# Patient Record
Sex: Female | Born: 1995 | Race: Black or African American | Hispanic: No | Marital: Single | Smoking: Never smoker
Health system: Southern US, Community
[De-identification: ages and names within clinical notes are randomized; demographics above are authoritative.]

## PROBLEM LIST (undated history)

## (undated) ENCOUNTER — Inpatient Hospital Stay (HOSPITAL_COMMUNITY): Payer: Self-pay

## (undated) DIAGNOSIS — A549 Gonococcal infection, unspecified: Secondary | ICD-10-CM

## (undated) DIAGNOSIS — D649 Anemia, unspecified: Secondary | ICD-10-CM

## (undated) DIAGNOSIS — Z8669 Personal history of other diseases of the nervous system and sense organs: Secondary | ICD-10-CM

## (undated) DIAGNOSIS — A749 Chlamydial infection, unspecified: Secondary | ICD-10-CM

## (undated) DIAGNOSIS — B977 Papillomavirus as the cause of diseases classified elsewhere: Secondary | ICD-10-CM

## (undated) HISTORY — PX: WISDOM TOOTH EXTRACTION: SHX21

## (undated) HISTORY — DX: Anemia, unspecified: D64.9

## (undated) HISTORY — DX: Papillomavirus as the cause of diseases classified elsewhere: B97.7

---
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
## 2016-05-02 ENCOUNTER — Inpatient Hospital Stay (HOSPITAL_COMMUNITY)
Admission: AD | Admit: 2016-05-02 | Discharge: 2016-05-02 | Disposition: A | Discharge status: Home / Self Care | Payer: Medicaid Other | Source: Ambulatory Visit | Attending: Obstetrics & Gynecology | Admitting: Obstetrics & Gynecology

## 2016-05-02 ENCOUNTER — Encounter (HOSPITAL_COMMUNITY): Payer: Self-pay

## 2016-05-02 DIAGNOSIS — R102 Pelvic and perineal pain: Secondary | ICD-10-CM | POA: Diagnosis not present

## 2016-05-02 DIAGNOSIS — N739 Female pelvic inflammatory disease, unspecified: Secondary | ICD-10-CM | POA: Diagnosis not present

## 2016-05-02 DIAGNOSIS — N939 Abnormal uterine and vaginal bleeding, unspecified: Secondary | ICD-10-CM | POA: Diagnosis present

## 2016-05-02 DIAGNOSIS — Z3202 Encounter for pregnancy test, result negative: Secondary | ICD-10-CM | POA: Insufficient documentation

## 2016-05-02 HISTORY — DX: Gonococcal infection, unspecified: A54.9

## 2016-05-02 LAB — URINALYSIS, ROUTINE W REFLEX MICROSCOPIC
Bilirubin Urine: NEGATIVE
Glucose, UA: NEGATIVE mg/dL
Hgb urine dipstick: NEGATIVE
Ketones, ur: NEGATIVE mg/dL
NITRITE: NEGATIVE
PROTEIN: NEGATIVE mg/dL
Specific Gravity, Urine: 1.015 (ref 1.005–1.030)
pH: 6.5 (ref 5.0–8.0)

## 2016-05-02 LAB — CBC
HCT: 29.8 % — ABNORMAL LOW (ref 36.0–46.0)
Hemoglobin: 9.4 g/dL — ABNORMAL LOW (ref 12.0–15.0)
MCH: 20.3 pg — ABNORMAL LOW (ref 26.0–34.0)
MCHC: 31.5 g/dL (ref 30.0–36.0)
MCV: 64.4 fL — ABNORMAL LOW (ref 78.0–100.0)
Platelets: 304 10*3/uL (ref 150–400)
RBC: 4.63 MIL/uL (ref 3.87–5.11)
RDW: 19.1 % — ABNORMAL HIGH (ref 11.5–15.5)
WBC: 19.3 10*3/uL — ABNORMAL HIGH (ref 4.0–10.5)

## 2016-05-02 LAB — WET PREP, GENITAL
CLUE CELLS WET PREP: NONE SEEN
Sperm: NONE SEEN
Trich, Wet Prep: NONE SEEN
YEAST WET PREP: NONE SEEN

## 2016-05-02 LAB — URINE MICROSCOPIC-ADD ON

## 2016-05-02 LAB — POCT PREGNANCY, URINE: Preg Test, Ur: NEGATIVE

## 2016-05-02 MED ORDER — IBUPROFEN 800 MG PO TABS: 800.0000 mg | ORAL_TABLET | Freq: Three times a day (TID) | ORAL | 2 refills | Status: DC | PRN

## 2016-05-02 MED ORDER — AZITHROMYCIN 250 MG PO TABS
1000.0000 mg | ORAL_TABLET | ORAL | Status: AC
  Administered 2016-05-02: 1000 mg via ORAL
  Filled 2016-05-02: qty 4

## 2016-05-02 MED ORDER — ACETAMINOPHEN 500 MG PO TABS
1000.0000 mg | ORAL_TABLET | Freq: Once | ORAL | Status: AC
  Administered 2016-05-02: 1000 mg via ORAL
  Filled 2016-05-02: qty 2

## 2016-05-02 MED ORDER — AZITHROMYCIN 500 MG PO TABS: 1000.0000 mg | ORAL_TABLET | Freq: Once | ORAL | 1 refills | Status: AC

## 2016-05-02 MED ORDER — KETOROLAC TROMETHAMINE 60 MG/2ML IM SOLN
60.0000 mg | Freq: Once | INTRAMUSCULAR | Status: AC
  Administered 2016-05-02: 60 mg via INTRAMUSCULAR
  Filled 2016-05-02: qty 2

## 2016-05-02 MED ORDER — CEFTRIAXONE SODIUM 250 MG IJ SOLR
250.0000 mg | INTRAMUSCULAR | Status: AC
  Administered 2016-05-02: 250 mg via INTRAMUSCULAR
  Filled 2016-05-02: qty 250

## 2016-05-02 NOTE — MAU Note (Signed)
Was supposed to come on the 18th.  Started bleeding on the 14th, bleeding heavy- having to change every hour, passing large clots.  Slowed down, now is orangish colored and is hurting worse than cramps.

## 2016-05-02 NOTE — MAU Provider Note (Signed)
History     CSN: [id number]  Arrival date and time: 05/02/16 1716   First Provider Initiated Contact with Patient 05/02/16 1905      Chief Complaint  Patient presents with  . Vaginal Bleeding  . Abdominal Pain   HPI  Alexandra Lowery is a 20 y.o. G1P1001 who presents to MAU today for evaluation of vaginal bleeding and pain. She reports that her period was supposed to come on the 18th.  Started bleeding on the 14th, bleeding was heavy, changed pad every hour, and was passing large clots.Accompanied by moderate cramping not alleviated by NSAIDs. Bleeding has since stopped, has orange colored discharge now but still having pelvic pain that is constant and not responding to taking two tablets of Ibuprofen.  Denies any fevers, chills, sweats, dysuria, nausea, vomiting, other GI or GU symptoms or other general symptoms. Patient has history of GC in 2015 .   Obstetric History   G1   P1   T1   P0   A0   L1    SAB0   TAB0   Ectopic0   Multiple0   Live Births1     # Outcome Date GA Lbr Len/2nd Weight Sex Delivery Anes PTL Lv  1 Term 12/15/12 [id number] 08:27 / 00:26 6 lb 10.7 oz (3.025 kg) F Vag-Spont EPI  LIV     Name: Anglemyer,GIRL Audreana     Apgar1:  9                Apgar5: 9      Past Medical History:  Diagnosis Date  . Gonorrhea 2015  . Hx of migraines     Past Surgical History:  Procedure Laterality Date  . WISDOM TOOTH EXTRACTION      History reviewed. No pertinent family history.  Social History  Substance Use Topics  . Smoking status: Never Smoker  . Smokeless tobacco: Never Used  . Alcohol use No    Allergies: No Known Allergies  Prescriptions Prior to Admission  Medication Sig Dispense Refill Last Dose  . [DISCONTINUED] ibuprofen (ADVIL,MOTRIN) 200 MG tablet Take 600 mg by mouth every 6 (six) hours as needed for mild pain.   05/01/2016 at Unknown time    Review of Systems  Constitutional: Negative for chills, fever and malaise/fatigue.  Gastrointestinal:  Positive for abdominal pain. Negative for blood in stool, constipation and diarrhea.  Genitourinary: Negative for dysuria, frequency, hematuria and urgency.  Musculoskeletal: Negative for myalgias.  Neurological: Negative for headaches.  All other systems reviewed and are negative.  Physical Exam   Blood pressure 101/62, pulse 92, temperature 98.8 F (37.1 C), temperature source Oral, resp. rate 16, weight 165 lb 8 oz (75.1 kg), last menstrual period 04/03/2016.  Physical Exam  Constitutional: She is oriented to person, place, and time. She appears well-developed and well-nourished.  HENT:  Head: Normocephalic and atraumatic.  Eyes: EOM are normal. Pupils are equal, round, and reactive to light.  Neck: Normal range of motion. Neck supple.  Cardiovascular: Normal rate.   Respiratory: Effort normal and breath sounds normal.  GI: Soft. She exhibits no distension and no mass. There is tenderness. There is no rebound and no guarding.  Genitourinary: Vaginal discharge found.  Genitourinary Comments: Yellow discharge noted in vagina.  Pelvic cultures obtained.  No CMT, but had moderate uterine tenderness to palpation. Normal adnexa, mild adnexal tenderness.   Musculoskeletal: Normal range of motion.  Neurological: She is alert and oriented to person, place, and time.  Skin: Skin  is warm and dry.    MAU Course  Procedures  MDM 1920 Toradol 60 mg IM, Rocephin 250mg  IM, Azithromycin 1000 mg PO administered due to concern about possible PID.   2000 Tylenol 1000 mg po also given for continued pain. 2030 Pain resolved  Results for orders placed or performed during the hospital encounter of 05/02/16 (from the past 24 hour(s))  Urinalysis, Routine w reflex microscopic (not at [facility])     Status: Abnormal   Collection Time: 05/02/16  6:33 PM  Result Value Ref Range   Color, Urine YELLOW YELLOW   APPearance CLEAR CLEAR   Specific Gravity, Urine 1.015 1.005 - 1.030   pH 6.5 5.0 - 8.0    Glucose, UA NEGATIVE NEGATIVE mg/dL   Hgb urine dipstick NEGATIVE NEGATIVE   Bilirubin Urine NEGATIVE NEGATIVE   Ketones, ur NEGATIVE NEGATIVE mg/dL   Protein, ur NEGATIVE NEGATIVE mg/dL   Nitrite NEGATIVE NEGATIVE   Leukocytes, UA SMALL (A) NEGATIVE  Urine microscopic-add on     Status: Abnormal   Collection Time: 05/02/16  6:33 PM  Result Value Ref Range   Squamous Epithelial / LPF 0-5 (A) NONE SEEN   WBC, UA 0-5 0 - 5 WBC/hpf   RBC / HPF 0-5 0 - 5 RBC/hpf   Bacteria, UA RARE (A) NONE SEEN   Urine-Other MUCOUS PRESENT   CBC     Status: Abnormal   Collection Time: 05/02/16  6:35 PM  Result Value Ref Range   WBC 19.3 (H) 4.0 - 10.5 K/uL   RBC 4.63 3.87 - 5.11 MIL/uL   Hemoglobin 9.4 (L) 12.0 - 15.0 g/dL   HCT 82.9 (L) 56.2 - 13.0 %   MCV 64.4 (L) 78.0 - 100.0 fL   MCH 20.3 (L) 26.0 - 34.0 pg   MCHC 31.5 30.0 - 36.0 g/dL   RDW 86.5 (H) 78.4 - 69.6 %   Platelets 304 150 - 400 K/uL  Pregnancy, urine POC     Status: None   Collection Time: 05/02/16  6:49 PM  Result Value Ref Range   Preg Test, Ur NEGATIVE NEGATIVE  Wet prep, genital     Status: Abnormal   Collection Time: 05/02/16  7:10 PM  Result Value Ref Range   Yeast Wet Prep HPF POC NONE SEEN NONE SEEN   Trich, Wet Prep NONE SEEN NONE SEEN   Clue Cells Wet Prep HPF POC NONE SEEN NONE SEEN   WBC, Wet Prep HPF POC FEW (A) NONE SEEN   Sperm NONE SEEN       Assessment and Plan   1. Pelvic inflammatory disease (PID)   2. Abnormal vaginal bleeding   3. Pelvic pain in female    Presumptively treated for PID, advised to take second dose of Azithromycin in seven days Ibuprofen 800 mg po tid prescribed Reassured about earlier menstrual period, bleeding precautions reviewed. Will follow up all pending results and manage accordingly. Discharged to home in stable condition.   Tereso Newcomer, MD 05/02/2016, 8:37 PM

## 2016-05-02 NOTE — Discharge Instructions (Signed)

## 2016-05-03 ENCOUNTER — Encounter: Payer: Self-pay | Admitting: Obstetrics & Gynecology

## 2016-05-03 LAB — GC/CHLAMYDIA PROBE AMP (~~LOC~~) NOT AT ARMC
Chlamydia: NEGATIVE
NEISSERIA GONORRHEA: POSITIVE — AB

## 2016-05-03 NOTE — Progress Notes (Signed)
Patient has gonorrhea, was treated in MAU.  Needs to take second dose of Azithromycin in one week as prescribed to complete PID treatment.   Recommend testing for other STIs, also needs to let partner(s) know so the partner(s) can get testing and treatment. Patient and sex partner(s) should abstain from unprotected sexual activity for seven days after everyone receives appropriate treatment.  Please call to inform patient of results and recommendations, and advise to take medications as prescribed during her MAU visit.  Tereso NewcomerUgonna A Mal Asher, MD

## 2016-05-04 ENCOUNTER — Telehealth (HOSPITAL_COMMUNITY): Payer: Self-pay | Admitting: *Deleted

## 2016-05-04 DIAGNOSIS — A549 Gonococcal infection, unspecified: Secondary | ICD-10-CM

## 2016-05-04 MED ORDER — AZITHROMYCIN 500 MG PO TABS: ORAL_TABLET | ORAL | 0 refills | Status: DC

## 2016-05-04 NOTE — Telephone Encounter (Signed)
Telephone call to patient regarding positive GC culture, patient notified.  Patient treated at time of visit but needs repeat dose of Azithromycin in one week per Dr. Macon LargeAnyanwu to cover her PID.  Instructed patient to repeat Rx on 05/10/16.  Rx sent to pharmacy.  Instructed patient to notify her partner for treatment and to abstain from sex for seven days post treatment.  Report faxed to health department.

## 2017-01-18 ENCOUNTER — Inpatient Hospital Stay (HOSPITAL_COMMUNITY): Payer: Medicaid Other

## 2017-01-18 ENCOUNTER — Inpatient Hospital Stay (HOSPITAL_COMMUNITY)
Admission: AD | Admit: 2017-01-18 | Discharge: 2017-01-18 | Disposition: A | Discharge status: Home / Self Care | Payer: Medicaid Other | Source: Ambulatory Visit | Attending: Obstetrics and Gynecology | Admitting: Obstetrics and Gynecology

## 2017-01-18 ENCOUNTER — Encounter (HOSPITAL_COMMUNITY): Payer: Self-pay | Admitting: *Deleted

## 2017-01-18 DIAGNOSIS — O26891 Other specified pregnancy related conditions, first trimester: Secondary | ICD-10-CM | POA: Insufficient documentation

## 2017-01-18 DIAGNOSIS — O99011 Anemia complicating pregnancy, first trimester: Secondary | ICD-10-CM | POA: Insufficient documentation

## 2017-01-18 DIAGNOSIS — O98811 Other maternal infectious and parasitic diseases complicating pregnancy, first trimester: Secondary | ICD-10-CM | POA: Diagnosis not present

## 2017-01-18 DIAGNOSIS — O3680X0 Pregnancy with inconclusive fetal viability, not applicable or unspecified: Secondary | ICD-10-CM

## 2017-01-18 DIAGNOSIS — O98211 Gonorrhea complicating pregnancy, first trimester: Secondary | ICD-10-CM | POA: Insufficient documentation

## 2017-01-18 DIAGNOSIS — B3731 Acute candidiasis of vulva and vagina: Secondary | ICD-10-CM

## 2017-01-18 DIAGNOSIS — O9989 Other specified diseases and conditions complicating pregnancy, childbirth and the puerperium: Secondary | ICD-10-CM | POA: Diagnosis not present

## 2017-01-18 DIAGNOSIS — R1030 Lower abdominal pain, unspecified: Secondary | ICD-10-CM | POA: Insufficient documentation

## 2017-01-18 DIAGNOSIS — N8312 Corpus luteum cyst of left ovary: Secondary | ICD-10-CM | POA: Insufficient documentation

## 2017-01-18 DIAGNOSIS — B373 Candidiasis of vulva and vagina: Secondary | ICD-10-CM

## 2017-01-18 DIAGNOSIS — K5900 Constipation, unspecified: Secondary | ICD-10-CM | POA: Diagnosis not present

## 2017-01-18 DIAGNOSIS — A749 Chlamydial infection, unspecified: Secondary | ICD-10-CM

## 2017-01-18 DIAGNOSIS — O26899 Other specified pregnancy related conditions, unspecified trimester: Secondary | ICD-10-CM

## 2017-01-18 DIAGNOSIS — O3481 Maternal care for other abnormalities of pelvic organs, first trimester: Secondary | ICD-10-CM | POA: Diagnosis not present

## 2017-01-18 DIAGNOSIS — Z3A00 Weeks of gestation of pregnancy not specified: Secondary | ICD-10-CM | POA: Diagnosis not present

## 2017-01-18 DIAGNOSIS — R109 Unspecified abdominal pain: Secondary | ICD-10-CM

## 2017-01-18 DIAGNOSIS — O99611 Diseases of the digestive system complicating pregnancy, first trimester: Secondary | ICD-10-CM

## 2017-01-18 HISTORY — DX: Chlamydial infection, unspecified: A74.9

## 2017-01-18 LAB — WET PREP, GENITAL
SPERM: NONE SEEN
TRICH WET PREP: NONE SEEN

## 2017-01-18 LAB — CBC
HCT: 26 % — ABNORMAL LOW (ref 36.0–46.0)
HEMOGLOBIN: 7.6 g/dL — AB (ref 12.0–15.0)
MCH: 16.9 pg — ABNORMAL LOW (ref 26.0–34.0)
MCHC: 29.2 g/dL — ABNORMAL LOW (ref 30.0–36.0)
MCV: 57.6 fL — ABNORMAL LOW (ref 78.0–100.0)
Platelets: 330 10*3/uL (ref 150–400)
RBC: 4.51 MIL/uL (ref 3.87–5.11)
RDW: 20.4 % — ABNORMAL HIGH (ref 11.5–15.5)
WBC: 7.5 10*3/uL (ref 4.0–10.5)

## 2017-01-18 LAB — URINALYSIS, ROUTINE W REFLEX MICROSCOPIC
Bilirubin Urine: NEGATIVE
Glucose, UA: NEGATIVE mg/dL
Hgb urine dipstick: NEGATIVE
Ketones, ur: 5 mg/dL — AB
Nitrite: NEGATIVE
PROTEIN: 30 mg/dL — AB
Specific Gravity, Urine: 1.024 (ref 1.005–1.030)
pH: 5 (ref 5.0–8.0)

## 2017-01-18 LAB — HCG, QUANTITATIVE, PREGNANCY: hCG, Beta Chain, Quant, S: 1843 m[IU]/mL — ABNORMAL HIGH (ref <5)

## 2017-01-18 LAB — POCT PREGNANCY, URINE: PREG TEST UR: POSITIVE — AB

## 2017-01-18 MED ORDER — DOCUSATE SODIUM 100 MG PO CAPS: 100.0000 mg | ORAL_CAPSULE | Freq: Two times a day (BID) | ORAL | 0 refills | Status: DC

## 2017-01-18 MED ORDER — FERROUS SULFATE 325 (65 FE) MG PO TABS: 325.0000 mg | ORAL_TABLET | Freq: Every day | ORAL | 0 refills | Status: DC

## 2017-01-18 MED ORDER — TERCONAZOLE 0.8 % VA CREA: 1.0000 | TOPICAL_CREAM | Freq: Every day | VAGINAL | 0 refills | Status: DC

## 2017-01-18 NOTE — Discharge Instructions (Signed)
Return to care   If you have heavier bleeding that soaks through more that 2 pads per hour for an hour or more  If you bleed so much that you feel like you might pass out or you do pass out  If you have significant abdominal pain that is not improved with Tylenol   If you develop a fever > 100.5     Constipation, Adult Constipation is when a person has fewer bowel movements in a week than normal, has difficulty having a bowel movement, or has stools that are dry, hard, or larger than normal. Constipation may be caused by an underlying condition. It may become worse with age if a person takes certain medicines and does not take in enough fluids. Follow these instructions at home: Eating and drinking   Eat foods that have a lot of fiber, such as fresh fruits and vegetables, whole grains, and beans.  Limit foods that are high in fat, low in fiber, or overly processed, such as french fries, hamburgers, cookies, candies, and soda.  Drink enough fluid to keep your urine clear or pale yellow. General instructions  Exercise regularly or as told by your health care provider.  Go to the restroom when you have the urge to go. Do not hold it in.  Take over-the-counter and prescription medicines only as told by your health care provider. These include any fiber supplements.  Practice pelvic floor retraining exercises, such as deep breathing while relaxing the lower abdomen and pelvic floor relaxation during bowel movements.  Watch your condition for any changes.  Keep all follow-up visits as told by your health care provider. This is important. Contact a health care provider if:  You have pain that gets worse.  You have a fever.  You do not have a bowel movement after 4 days.  You vomit.  You are not hungry.  You lose weight.  You are bleeding from the anus.  You have thin, pencil-like stools. Get help right away if:  You have a fever and your symptoms suddenly get  worse.  You leak stool or have blood in your stool.  Your abdomen is bloated.  You have severe pain in your abdomen.  You feel dizzy or you faint. This information is not intended to replace advice given to you by your health care provider. Make sure you discuss any questions you have with your health care provider. Document Released: 04/27/2004 Document Revised: 02/17/2016 Document Reviewed: 01/18/2016 Elsevier Interactive Patient Education  2017 [employer]    Anemia, Nonspecific Anemia is a condition in which the concentration of red blood cells or hemoglobin in the blood is below normal. Hemoglobin is a substance in red blood cells that carries oxygen to the tissues of the body. Anemia results in not enough oxygen reaching these tissues. What are the causes? Common causes of anemia include:  Excessive bleeding. Bleeding may be internal or external. This includes excessive bleeding from periods (in women) or from the intestine.  Poor nutrition.  Chronic kidney, thyroid, and liver disease.  Bone marrow disorders that decrease red blood cell production.  Cancer and treatments for cancer.  HIV, AIDS, and their treatments.  Spleen problems that increase red blood cell destruction.  Blood disorders.  Excess destruction of red blood cells due to infection, medicines, and autoimmune disorders.  What are the signs or symptoms?  Minor weakness.  Dizziness.  Headache.  Palpitations.  Shortness of breath, especially with exercise.  Paleness.  Cold sensitivity.  Indigestion.  Nausea.  Difficulty sleeping.  Difficulty concentrating. Symptoms may occur suddenly or they may develop slowly. How is this diagnosed? Additional blood tests are often needed. These help your health care provider determine the best treatment. Your health care provider will check your stool for blood and look for other causes of blood loss. How is this treated? Treatment varies  depending on the cause of the anemia. Treatment can include:  Supplements of iron, vitamin B12, or folic acid.  Hormone medicines.  A blood transfusion. This may be needed if blood loss is severe.  Hospitalization. This may be needed if there is significant continual blood loss.  Dietary changes.  Spleen removal.  Follow these instructions at home: Keep all follow-up appointments. It often takes many weeks to correct anemia, and having your health care provider check on your condition and your response to treatment is very important. Get help right away if:  You develop extreme weakness, shortness of breath, or chest pain.  You become dizzy or have trouble concentrating.  You develop heavy vaginal bleeding.  You develop a rash.  You have bloody or black, tarry stools.  You faint.  You vomit up blood.  You vomit repeatedly.  You have abdominal pain.  You have a fever or persistent symptoms for more than 2-3 days.  You have a fever and your symptoms suddenly get worse.  You are dehydrated. This information is not intended to replace advice given to you by your health care provider. Make sure you discuss any questions you have with your health care provider. Document Released: 09/06/2004 Document Revised: 01/11/2016 Document Reviewed: 01/23/2013 Elsevier Interactive Patient Education  2017 [employer].

## 2017-01-18 NOTE — MAU Note (Signed)
+  HPT yesterday LMP 12/20/2016  +lower abdominal and back pain Sharp in nature Tried tylenol last took 2 days ago with no relief Rating pain 5/10 Pain comes and goes Awakes from sleep  +vaginal discharge Thick and white  Denies vaginal bleeding

## 2017-01-18 NOTE — MAU Provider Note (Signed)
History     CSN: [id number]  Arrival date and time: 01/18/17 1746  First Provider Initiated Contact with Patient 01/18/17 1916      Chief Complaint  Patient presents with  . Abdominal Pain  . Vaginal Discharge   HPI Alexandra Lowery is a 21 y.o. G2P1001 at 2136w1d by LMP who presents with abdominal pain & vaginal discharge. Symptoms began last week. Reports worsening lower abdominal cramping that comes & goes. Rates pain 5/10. Has taken tylenol without relief. Also reports thick white vaginal discharge. Use OTC yeast medication last week. Denies n/v/d, vaginal bleeding, dysuria, or hematuria. Doesn't remember when last BM was; states she's been having issues with constipation. Hasn't treated constipation.   OB History    Gravida Para Term Preterm AB Living   2 1 1     1    SAB TAB Ectopic Multiple Live Births           1      Past Medical History:  Diagnosis Date  . Gonorrhea 2015 and 2017  . Hx of migraines     Past Surgical History:  Procedure Laterality Date  . WISDOM TOOTH EXTRACTION      No family history on file.  Social History  Substance Use Topics  . Smoking status: Never Smoker  . Smokeless tobacco: Never Used  . Alcohol use No    Allergies: No Known Allergies  Prescriptions Prior to Admission  Medication Sig Dispense Refill Last Dose  . azithromycin (ZITHROMAX) 500 MG tablet Take two tablets by mouth once 2 tablet 0   . ibuprofen (ADVIL,MOTRIN) 800 MG tablet Take 1 tablet (800 mg total) by mouth 3 (three) times daily with meals as needed for mild pain. 30 tablet 2     Review of Systems  Constitutional: Negative.   Gastrointestinal: Positive for abdominal pain and constipation. Negative for blood in stool, diarrhea, nausea and vomiting.  Genitourinary: Positive for vaginal discharge. Negative for dysuria and vaginal bleeding.   Physical Exam   Blood pressure 116/65, pulse 75, temperature 98.5 F (36.9 C), temperature source Oral, resp. rate 16,  weight 153 lb 1.9 oz (69.5 kg), last menstrual period 12/20/2016, SpO2 99 %.  Physical Exam  Nursing note and vitals reviewed. Constitutional: She is oriented to person, place, and time. She appears well-developed and well-nourished. No distress.  HENT:  Head: Normocephalic and atraumatic.  Eyes: Conjunctivae are normal. Right eye exhibits no discharge. Left eye exhibits no discharge. No scleral icterus.  Neck: Normal range of motion.  Cardiovascular: Normal rate, regular rhythm and normal heart sounds.   No murmur heard. Respiratory: Effort normal and breath sounds normal. No respiratory distress. She has no wheezes.  GI: Soft. Bowel sounds are normal. There is no tenderness.  Genitourinary: Uterus normal. Cervix exhibits no motion tenderness. Right adnexum displays no mass and no tenderness. Left adnexum displays no mass and no tenderness.  Genitourinary Comments: Cervix closed  Neurological: She is alert and oriented to person, place, and time.  Skin: Skin is warm and dry. She is not diaphoretic.  Psychiatric: She has a normal mood and affect. Her behavior is normal. Judgment and thought content normal.    MAU Course  Procedures Results for orders placed or performed during the hospital encounter of 01/18/17 (from the past 24 hour(s))  Urinalysis, Routine w reflex microscopic     Status: Abnormal   Collection Time: 01/18/17  5:55 PM  Result Value Ref Range   Color, Urine YELLOW YELLOW  APPearance CLOUDY (A) CLEAR   Specific Gravity, Urine 1.024 1.005 - 1.030   pH 5.0 5.0 - 8.0   Glucose, UA NEGATIVE NEGATIVE mg/dL   Hgb urine dipstick NEGATIVE NEGATIVE   Bilirubin Urine NEGATIVE NEGATIVE   Ketones, ur 5 (A) NEGATIVE mg/dL   Protein, ur 30 (A) NEGATIVE mg/dL   Nitrite NEGATIVE NEGATIVE   Leukocytes, UA LARGE (A) NEGATIVE   RBC / HPF 6-30 0 - 5 RBC/hpf   WBC, UA TOO NUMEROUS TO COUNT 0 - 5 WBC/hpf   Bacteria, UA RARE (A) NONE SEEN   Squamous Epithelial / LPF 6-30 (A) NONE  SEEN   Mucous PRESENT   Pregnancy, urine POC     Status: Abnormal   Collection Time: 01/18/17  6:11 PM  Result Value Ref Range   Preg Test, Ur POSITIVE (A) NEGATIVE  CBC     Status: Abnormal   Collection Time: 01/18/17  7:02 PM  Result Value Ref Range   WBC 7.5 4.0 - 10.5 K/uL   RBC 4.51 3.87 - 5.11 MIL/uL   Hemoglobin 7.6 (L) 12.0 - 15.0 g/dL   HCT 47.8 (L) 29.5 - 62.1 %   MCV 57.6 (L) 78.0 - 100.0 fL   MCH 16.9 (L) 26.0 - 34.0 pg   MCHC 29.2 (L) 30.0 - 36.0 g/dL   RDW 30.8 (H) 65.7 - 84.6 %   Platelets 330 150 - 400 K/uL  hCG, quantitative, pregnancy     Status: Abnormal   Collection Time: 01/18/17  7:02 PM  Result Value Ref Range   hCG, Beta Chain, Quant, S 1,843 (H) <5 mIU/mL  Wet prep, genital     Status: Abnormal   Collection Time: 01/18/17  7:18 PM  Result Value Ref Range   Yeast Wet Prep HPF POC PRESENT (A) NONE SEEN   Trich, Wet Prep NONE SEEN NONE SEEN   Clue Cells Wet Prep HPF POC PRESENT (A) NONE SEEN   WBC, Wet Prep HPF POC MODERATE (A) NONE SEEN   Sperm NONE SEEN    US Ob Comp Less 14 Wks  Result Date: 01/18/2017 CLINICAL DATA:  First-trimester pregnancy. Sharp lower abdominal pain since yesterday. Quantitative beta HCG is 1843 EXAM: OBSTETRIC <14 WK [place] AND TRANSVAGINAL OB US TECHNIQUE: Both transabdominal and transvaginal ultrasound examinations were performed for complete evaluation of the gestation as well as the maternal uterus, adnexal regions, and pelvic cul-de-sac. Transvaginal technique was performed to assess early pregnancy. COMPARISON:  None. FINDINGS: Intrauterine gestational sac: Not visualized. Subchorionic hemorrhage:  None visualized. Maternal uterus/adnexae: The endometrium is somewhat thickened and heterogeneous. No discrete gestational sac is visualized. The ovaries are within normal limits bilaterally measuring 2.0 x 1.8 x 1.6 cm on the right and 3.0 x 3.7 x 2.9 cm on the left. A corpus luteal cyst is evident within the left ovary. A moderate amount  of free fluid is present. IMPRESSION: 1. No intrauterine gestational sac identified. Probable early intrauterine gestational sac, but no yolk sac, fetal pole, or cardiac activity yet visualized. Recommend follow-up quantitative B-HCG levels and follow-up US in 14 days to assess viability. This recommendation follows SRU consensus guidelines: Diagnostic Criteria for Nonviable Pregnancy Early in the First Trimester. Malva Limes Med 2013; 962:9528-41. 2. Left sided corpus luteal cyst. 3. Moderate free fluid. Electronically Signed   By: Marin Roberts M.D.   On: 01/18/2017 20:44   US Ob Transvaginal  Result Date: 01/18/2017 CLINICAL DATA:  First-trimester pregnancy. Sharp lower abdominal pain since yesterday. Quantitative  beta HCG is 1843 EXAM: OBSTETRIC <14 WK [place] AND TRANSVAGINAL OB US TECHNIQUE: Both transabdominal and transvaginal ultrasound examinations were performed for complete evaluation of the gestation as well as the maternal uterus, adnexal regions, and pelvic cul-de-sac. Transvaginal technique was performed to assess early pregnancy. COMPARISON:  None. FINDINGS: Intrauterine gestational sac: Not visualized. Subchorionic hemorrhage:  None visualized. Maternal uterus/adnexae: The endometrium is somewhat thickened and heterogeneous. No discrete gestational sac is visualized. The ovaries are within normal limits bilaterally measuring 2.0 x 1.8 x 1.6 cm on the right and 3.0 x 3.7 x 2.9 cm on the left. A corpus luteal cyst is evident within the left ovary. A moderate amount of free fluid is present. IMPRESSION: 1. No intrauterine gestational sac identified. Probable early intrauterine gestational sac, but no yolk sac, fetal pole, or cardiac activity yet visualized. Recommend follow-up quantitative B-HCG levels and follow-up US in 14 days to assess viability. This recommendation follows SRU consensus guidelines: Diagnostic Criteria for Nonviable Pregnancy Early in the First Trimester. Malva Limes Med 2013;  161:0960-45. 2. Left sided corpus luteal cyst. 3. Moderate free fluid. Electronically Signed   By: Marin Roberts M.D.   On: 01/18/2017 20:44    MDM +UPT UA, wet prep, GC/chlamydia, CBC, ABO/Rh, quant hCG, HIV, and [place] today to rule out ectopic pregnancy Wet prep + yeast BHCG 1843 Ultrasound shows no IUP, thickened endometrium, left CLC, moderate free fluid Hemoglobin 7.6; hx of anemia Discussed labs & ultrasound with Dr. Emelda Fear whom reviewed the images. Will have patient return for repeat BHCG on Monday Assessment and Plan  A: 1. Pregnancy of unknown anatomic location   2. Abdominal pain affecting pregnancy   3. Anemia during pregnancy in first trimester   4. Constipation during pregnancy in first trimester   5. Vaginal yeast infection    P: Discharge home Rx terazol, ferrous sulfate, colace Return to MAU Monday for repeat BHCG (states can't return during clinic hours) Discussed reasons to return to MAU including s/s ectopic Discussed tx of constipation GC/CT pending  Judeth Horn 01/18/2017, 7:16 PM

## 2017-01-19 LAB — HIV ANTIBODY (ROUTINE TESTING W REFLEX): HIV SCREEN 4TH GENERATION: NONREACTIVE

## 2017-01-21 LAB — GC/CHLAMYDIA PROBE AMP (~~LOC~~) NOT AT ARMC
Chlamydia: POSITIVE — AB
Neisseria Gonorrhea: NEGATIVE

## 2017-01-22 ENCOUNTER — Ambulatory Visit: Payer: Medicaid Other | Admitting: General Practice

## 2017-01-22 ENCOUNTER — Other Ambulatory Visit: Payer: Self-pay | Admitting: Nurse Practitioner

## 2017-01-22 ENCOUNTER — Telehealth: Payer: Self-pay | Admitting: General Practice

## 2017-01-22 ENCOUNTER — Telehealth (HOSPITAL_COMMUNITY): Payer: Self-pay | Admitting: *Deleted

## 2017-01-22 DIAGNOSIS — O3680X0 Pregnancy with inconclusive fetal viability, not applicable or unspecified: Secondary | ICD-10-CM

## 2017-01-22 DIAGNOSIS — A749 Chlamydial infection, unspecified: Secondary | ICD-10-CM | POA: Insufficient documentation

## 2017-01-22 DIAGNOSIS — O98811 Other maternal infectious and parasitic diseases complicating pregnancy, first trimester: Secondary | ICD-10-CM

## 2017-01-22 LAB — HCG, QUANTITATIVE, PREGNANCY: hCG, Beta Chain, Quant, S: 9272 m[IU]/mL — ABNORMAL HIGH (ref <5)

## 2017-01-22 MED ORDER — AZITHROMYCIN 500 MG PO TABS: 1000.0000 mg | ORAL_TABLET | Freq: Once | ORAL | 0 refills | Status: AC

## 2017-01-22 NOTE — Telephone Encounter (Signed)
Called patient regarding bhcg results, + chlamydia, & ultrasound appt at requested number and her mother answered stating she wasn't with her but would have her call us. Tried alternative number and number is invalid.  6/12 @ 330 Attempted to reach patient again at [phone] no answer. Left message to call us back concerning urgent results & an appt.

## 2017-01-22 NOTE — ED Provider Notes (Signed)
Labs reviewed - Positive for chlamydia - [facility] and [facility] will notify the patient.  Medication eprescribed to client's pharmacy.  Nolene Bernheimerri Mihcael Ledee, NP

## 2017-01-22 NOTE — Progress Notes (Signed)
Patient here for stat bhcg today. Patient denies pain or bleeding. Told patient we will call her in a couple hours with results and next steps/updated plan of care. Patient verbalized understanding and left contact number [phone]. Per Vonzella NippleJulie Wenzel, patient's bhcg levels have risen appropriately and she needs follow up ultrasound next week. Scheduled for 6/21 @ 9am. Will call patient with results.

## 2017-01-23 NOTE — Telephone Encounter (Signed)
Pt's mother left message yesterday @ 1340 and provided and alternate tel # for Alexandra Lowery.  [phone]

## 2017-01-23 NOTE — Telephone Encounter (Signed)
Called patient at [phone] and informed her of bhcg results & chlamydia as well as ultrasound appt. Discussed importance of treatment & partner(s) treatment with patient. She is aware to abstain from intercourse for 2 weeks following treatment or she risks re-infection. Patient verbalized understanding to all & has no questions

## 2017-01-23 NOTE — Telephone Encounter (Signed)
Pt called @ 1002 and left message stating that she can be reached @ [phone] for her results

## 2017-01-23 NOTE — Telephone Encounter (Signed)
Called patient at provided number from mother and patient's boyfriend answered stating he wasn't with her right now and wants to know if she's really pregnant. Told him I cannot discuss that information with him & asked if she had another number I could reach her at. He states this is the only number but to call back after 11 and he should be with her then. Told him I would.

## 2017-01-31 ENCOUNTER — Encounter: Payer: Self-pay | Admitting: Obstetrics & Gynecology

## 2017-01-31 ENCOUNTER — Ambulatory Visit (HOSPITAL_COMMUNITY)
Admission: RE | Admit: 2017-01-31 | Discharge: 2017-01-31 | Disposition: A | Discharge status: Home / Self Care | Payer: Medicaid Other | Source: Ambulatory Visit | Attending: Medical | Admitting: Medical

## 2017-01-31 ENCOUNTER — Ambulatory Visit: Payer: Medicaid Other

## 2017-01-31 DIAGNOSIS — O3680X0 Pregnancy with inconclusive fetal viability, not applicable or unspecified: Secondary | ICD-10-CM

## 2017-01-31 DIAGNOSIS — Z3A01 Less than 8 weeks gestation of pregnancy: Secondary | ICD-10-CM | POA: Insufficient documentation

## 2017-01-31 DIAGNOSIS — Z712 Person consulting for explanation of examination or test findings: Secondary | ICD-10-CM

## 2017-01-31 DIAGNOSIS — O208 Other hemorrhage in early pregnancy: Secondary | ICD-10-CM | POA: Insufficient documentation

## 2017-01-31 DIAGNOSIS — Z3490 Encounter for supervision of normal pregnancy, unspecified, unspecified trimester: Secondary | ICD-10-CM | POA: Diagnosis present

## 2017-01-31 NOTE — Progress Notes (Signed)
Patient presented to the office today for follow up on u/s result. Ultrasound results show patient had viable pregnancy. Heart Rate at 124. Patient has been advised to start taking prenatal vitamins at this time. Patient voice understanding. A letter of pregnancy verification has been provided to patient to apply for assistance.

## 2017-03-07 ENCOUNTER — Inpatient Hospital Stay (HOSPITAL_COMMUNITY)
Admission: AD | Admit: 2017-03-07 | Discharge: 2017-03-07 | Disposition: A | Discharge status: Home / Self Care | Payer: Medicaid Other | Source: Ambulatory Visit | Attending: Obstetrics & Gynecology | Admitting: Obstetrics & Gynecology

## 2017-03-07 ENCOUNTER — Encounter (HOSPITAL_COMMUNITY): Payer: Self-pay | Admitting: Certified Nurse Midwife

## 2017-03-07 DIAGNOSIS — B9689 Other specified bacterial agents as the cause of diseases classified elsewhere: Secondary | ICD-10-CM | POA: Insufficient documentation

## 2017-03-07 DIAGNOSIS — O9989 Other specified diseases and conditions complicating pregnancy, childbirth and the puerperium: Secondary | ICD-10-CM

## 2017-03-07 DIAGNOSIS — R109 Unspecified abdominal pain: Secondary | ICD-10-CM | POA: Diagnosis not present

## 2017-03-07 DIAGNOSIS — M549 Dorsalgia, unspecified: Secondary | ICD-10-CM | POA: Diagnosis present

## 2017-03-07 DIAGNOSIS — A749 Chlamydial infection, unspecified: Secondary | ICD-10-CM

## 2017-03-07 DIAGNOSIS — Z202 Contact with and (suspected) exposure to infections with a predominantly sexual mode of transmission: Secondary | ICD-10-CM | POA: Diagnosis not present

## 2017-03-07 DIAGNOSIS — O23591 Infection of other part of genital tract in pregnancy, first trimester: Secondary | ICD-10-CM | POA: Diagnosis not present

## 2017-03-07 DIAGNOSIS — Z3A11 11 weeks gestation of pregnancy: Secondary | ICD-10-CM

## 2017-03-07 DIAGNOSIS — O26891 Other specified pregnancy related conditions, first trimester: Secondary | ICD-10-CM | POA: Diagnosis present

## 2017-03-07 DIAGNOSIS — N760 Acute vaginitis: Secondary | ICD-10-CM

## 2017-03-07 HISTORY — DX: Chlamydial infection, unspecified: A74.9

## 2017-03-07 LAB — WET PREP, GENITAL
Sperm: NONE SEEN
Trich, Wet Prep: NONE SEEN
Yeast Wet Prep HPF POC: NONE SEEN

## 2017-03-07 LAB — URINALYSIS, ROUTINE W REFLEX MICROSCOPIC
Bacteria, UA: NONE SEEN
Bilirubin Urine: NEGATIVE
Glucose, UA: NEGATIVE mg/dL
Hgb urine dipstick: NEGATIVE
Ketones, ur: NEGATIVE mg/dL
Nitrite: NEGATIVE
PROTEIN: NEGATIVE mg/dL
Specific Gravity, Urine: 1.024 (ref 1.005–1.030)
pH: 5 (ref 5.0–8.0)

## 2017-03-07 MED ORDER — TERCONAZOLE 0.4 % VA CREA: 1.0000 | TOPICAL_CREAM | Freq: Every day | VAGINAL | 0 refills | Status: DC

## 2017-03-07 MED ORDER — AZITHROMYCIN 250 MG PO TABS
1000.0000 mg | ORAL_TABLET | Freq: Once | ORAL | Status: AC
  Administered 2017-03-07: 1000 mg via ORAL
  Filled 2017-03-07: qty 4

## 2017-03-07 MED ORDER — METRONIDAZOLE 500 MG PO TABS: 500.0000 mg | ORAL_TABLET | Freq: Two times a day (BID) | ORAL | 0 refills | Status: DC

## 2017-03-07 NOTE — MAU Provider Note (Signed)
History     CSN: [id number]  Arrival date and time: 03/07/17 [id number]   First Provider Initiated Contact with Patient 03/07/17 [phone]     Chief Complaint  Patient presents with  . Abdominal Pain  . Back Pain   HPI Alexandra Lowery is a 21 y.o. G2P1001 at 837w0d who presents with abdominal pain and back pain. She states the pain has been going on for 3 weeks and she rates it a 4/10, has not tried anything for the pain. She states she was treated for chlamydia in June and had unprotected sex with the same untreated partner. She is requesting screening for STDs today. She also feels like she has a yeast infection.    OB History    Gravida Para Term Preterm AB Living   2 1 1     1    SAB TAB Ectopic Multiple Live Births           1      Past Medical History:  Diagnosis Date  . Chlamydia 01/18/2017  . Gonorrhea 2015 and 2017  . Hx of migraines     Past Surgical History:  Procedure Laterality Date  . WISDOM TOOTH EXTRACTION      History reviewed. No pertinent family history.  Social History  Substance Use Topics  . Smoking status: Never Smoker  . Smokeless tobacco: Never Used  . Alcohol use No    Allergies: No Known Allergies  Prescriptions Prior to Admission  Medication Sig Dispense Refill Last Dose  . Prenatal Vit-Fe Fumarate-FA (PRENATAL MULTIVITAMIN) TABS tablet Take 1 tablet by mouth daily at 12 noon.   03/06/2017 at Unknown time  . acetaminophen (TYLENOL) 500 MG tablet Take 500 mg by mouth every 6 (six) hours as needed for mild pain.   Not Taking  . docusate sodium (COLACE) 100 MG capsule Take 1 capsule (100 mg total) by mouth every 12 (twelve) hours. (Patient not taking: Reported on 01/31/2017) 60 capsule 0 Not Taking  . ferrous sulfate 325 (65 FE) MG tablet Take 1 tablet (325 mg total) by mouth daily. (Patient not taking: Reported on 01/31/2017) 30 tablet 0 Not Taking  . terconazole (TERAZOL 3) 0.8 % vaginal cream Place 1 applicator vaginally at bedtime. 20 g 0 Taking     Review of Systems  Constitutional: Negative.  Negative for chills and fever.  HENT: Negative.   Respiratory: Negative.  Negative for shortness of breath.   Cardiovascular: Negative.  Negative for chest pain.  Gastrointestinal: Positive for abdominal pain. Negative for constipation, diarrhea, nausea and vomiting.  Genitourinary: Negative.  Negative for dysuria, vaginal bleeding and vaginal discharge.  Musculoskeletal: Positive for back pain.  Neurological: Negative.  Negative for dizziness and headaches.  Psychiatric/Behavioral: Negative.    Physical Exam   Blood pressure (!) 108/56, pulse 83, temperature 97.8 F (36.6 C), temperature source Oral, resp. rate 16, height 5\' 9"  (1.753 m), weight 156 lb (70.8 kg), last menstrual period 12/20/2016, SpO2 100 %.  Physical Exam  Nursing note and vitals reviewed. Constitutional: She appears well-developed and well-nourished.  HENT:  Head: Normocephalic and atraumatic.  Eyes: Conjunctivae are normal. No scleral icterus.  Cardiovascular: Normal rate, regular rhythm and normal heart sounds.   Respiratory: Effort normal and breath sounds normal. No respiratory distress.  GI: Soft. She exhibits no distension. There is no tenderness. There is no guarding.  Neurological: She is alert.  Skin: Skin is warm and dry.  Psychiatric: She has a normal mood and affect. Her behavior  is normal. Judgment and thought content normal.    MAU Course  Procedures Results for orders placed or performed during the hospital encounter of 03/07/17 (from the past 24 hour(s))  Wet prep, genital     Status: Abnormal   Collection Time: 03/07/17  2:25 AM  Result Value Ref Range   Yeast Wet Prep HPF POC NONE SEEN NONE SEEN   Trich, Wet Prep NONE SEEN NONE SEEN   Clue Cells Wet Prep HPF POC PRESENT (A) NONE SEEN   WBC, Wet Prep HPF POC MODERATE (A) NONE SEEN   Sperm NONE SEEN   Urinalysis, Routine w reflex microscopic     Status: Abnormal   Collection Time:  03/07/17  2:40 AM  Result Value Ref Range   Color, Urine YELLOW YELLOW   APPearance CLEAR CLEAR   Specific Gravity, Urine 1.024 1.005 - 1.030   pH 5.0 5.0 - 8.0   Glucose, UA NEGATIVE NEGATIVE mg/dL   Hgb urine dipstick NEGATIVE NEGATIVE   Bilirubin Urine NEGATIVE NEGATIVE   Ketones, ur NEGATIVE NEGATIVE mg/dL   Protein, ur NEGATIVE NEGATIVE mg/dL   Nitrite NEGATIVE NEGATIVE   Leukocytes, UA TRACE (A) NEGATIVE   RBC / HPF 0-5 0 - 5 RBC/hpf   WBC, UA 0-5 0 - 5 WBC/hpf   Bacteria, UA NONE SEEN NONE SEEN   Squamous Epithelial / LPF 0-5 (A) NONE SEEN   Mucous PRESENT    MDM UA Wet prep and gc/chlamydia Azithromycin 1000mg  PO Assessment and Plan   1. Exposure to sexually transmitted disease (STD)   2. Chlamydia infection   3. Bacterial vaginosis   4. [redacted] weeks gestation of pregnancy    -Discharge patient home in stable condition -Prescriptions for metronidazole and terazole cream sent to patient's pharmacy -[facility], [facility] -Encouraged to return here or to other Urgent Care/ED if she develops worsening of symptoms, increase in pain, fever, or other concerning symptoms.   Cleone SlimCaroline Neill SNM 03/07/2017, 3:20 AM   I confirm that I have verified the information documented in the nurse midwife student's note and that I have also personally reperformed the physical exam and all medical decision making activities.   Thressa ShellerHeather Junaid Wurzer 7:53 AM 03/07/17

## 2017-03-07 NOTE — Discharge Instructions (Signed)
Chlamydia, Female Chlamydia is an STD (sexually transmitted disease). It is a bacterial infection that spreads (is contagious) through sexual contact. Chlamydia can occur in different areas of the body, including:  The tube that moves urine from the bladder out of the body (urethra).  The lower part of the uterus (cervix).  The throat.  The rectum.  This condition is not difficult to treat. However, if left untreated, chlamydia can lead to more serious health problems, including pelvic inflammatory disorder (PID). PID can increase your risk of not being able to have children (sterility). What are the causes? Chlamydia is caused by the bacteria Chlamydia trachomatis. It is passed from an infected partner during sexual activity. Chlamydia can spread through contact with the genitals, mouth, or rectum. What are the signs or symptoms? In some cases, there may not be any symptoms for this condition (asymptomatic), especially early in the infection. If symptoms develop, they may include:  Burning with urination.  Frequent urination.  Vaginal discharge.  Redness, soreness, and swelling (inflammation) of the rectum.  Bleeding or discharge from the rectum.  Abdominal pain.  Pain during sexual intercourse.  Bleeding between menstrual periods.  Itching, burning, or redness in the eyes, or discharge from the eyes.  How is this diagnosed? This condition may be diagnosed with:  Urine tests.  Swab tests. Depending on your symptoms, your health care provider may use a cotton swab to collect discharge from your vagina or rectum to test for the bacteria.  A pelvic exam.  How is this treated? This condition is treated with antibiotic medicines. If you are pregnant, certain types of antibiotics will need to be avoided. Follow these instructions at home: Medicines  Take over-the-counter and prescription medicines only as told by your health care provider.  Take your antibiotic medicine  as told by your health care provider. Do not stop taking the antibiotic even if you start to feel better. Sexual activity  Tell sexual partners about your infection. This includes any oral, anal, or vaginal sex partners you have had within 60 days of when your symptoms started. Sexual partners should also be treated, even if they have no signs of the disease.  Do not have sex until you and your sexual partners have completed treatment and your health care provider says it is okay. If your health care provider prescribed you a single dose treatment, wait 7 days after taking the treatment before having sex. General instructions  It is your responsibility to get your test results. Ask your health care provider, or the department performing the test, when your results will be ready.  Get plenty of rest.  Eat a healthy, well-balanced diet.  Drink enough fluids to keep your urine clear or pale yellow.  Keep all follow-up visits as told by your health care provider. This is important. You may need to be tested for infection again 3 months after treatment. How is this prevented? The only sure way to prevent chlamydia is to avoid having sex. However, you can lower your risk by:  Using latex condoms correctly every time you have sex.  Not having multiple sexual partners.  Asking if your sexual partner has been tested for STIs and had negative results.  Contact a health care provider if:  You develop new symptoms or your symptoms do not get better after completing treatment.  You have a fever or chills.  You have pain during sexual intercourse. Get help right away if:  Your pain gets worse and does   not get better with medicine.  You develop flu-like symptoms, such as night sweats, sore throat, or muscle aches.  You experience nausea or vomiting.  You have difficulty swallowing.  You have bleeding between periods or after sex.  You have irregular menstrual periods.  You have  abdominal or lower back pain that does not get better with medicine.  You feel weak or dizzy, or you faint.  You are pregnant and you develop symptoms of chlamydia. Summary  Chlamydia is an STD (sexually transmitted disease). It is a bacterial infection that spreads (is contagious) through sexual contact.  This condition is not difficult to treat, however. If left untreated, chlamydia can lead to more serious health problems, including pelvic inflammatory disease (PID).  In some cases, there may not be any symptoms for this condition (asymptomatic).  This condition is treated with antibiotic medicines.  Using latex condoms correctly every time you have sex can help prevent chlamydia. This information is not intended to replace advice given to you by your health care provider. Make sure you discuss any questions you have with your health care provider. Document Released: 05/09/2005 Document Revised: 07/16/2016 Document Reviewed: 07/16/2016 Elsevier Interactive Patient Education  2017 [employer]  

## 2017-03-07 NOTE — MAU Note (Signed)
Pt here with c/o abdominal and back pain. Was treated for an STD last month but not sure if it's gone.

## 2017-03-08 LAB — GC/CHLAMYDIA PROBE AMP (~~LOC~~) NOT AT ARMC
Chlamydia: NEGATIVE
Neisseria Gonorrhea: NEGATIVE

## 2017-03-25 ENCOUNTER — Encounter: Payer: Medicaid Other | Admitting: Certified Nurse Midwife

## 2017-03-25 ENCOUNTER — Encounter: Payer: Self-pay | Admitting: Obstetrics & Gynecology

## 2017-03-26 ENCOUNTER — Other Ambulatory Visit: Payer: Self-pay | Admitting: Student

## 2017-04-10 ENCOUNTER — Encounter: Payer: Self-pay | Admitting: Obstetrics & Gynecology

## 2017-04-10 ENCOUNTER — Ambulatory Visit (INDEPENDENT_AMBULATORY_CARE_PROVIDER_SITE_OTHER): Payer: Medicaid Other | Admitting: Obstetrics & Gynecology

## 2017-04-10 DIAGNOSIS — Z3490 Encounter for supervision of normal pregnancy, unspecified, unspecified trimester: Secondary | ICD-10-CM | POA: Insufficient documentation

## 2017-04-10 DIAGNOSIS — Z3481 Encounter for supervision of other normal pregnancy, first trimester: Secondary | ICD-10-CM | POA: Diagnosis not present

## 2017-04-10 DIAGNOSIS — Z3482 Encounter for supervision of other normal pregnancy, second trimester: Secondary | ICD-10-CM

## 2017-04-10 DIAGNOSIS — Z3480 Encounter for supervision of other normal pregnancy, unspecified trimester: Secondary | ICD-10-CM

## 2017-04-10 MED ORDER — DOXYLAMINE-PYRIDOXINE 10-10 MG PO TBEC: 1.0000 | DELAYED_RELEASE_TABLET | Freq: Two times a day (BID) | ORAL | 1 refills | Status: DC | PRN

## 2017-04-10 NOTE — Progress Notes (Signed)
  Subjective:    Alexandra Lowery is a G2P1001 4660w6d being seen today for her first obstetrical visit.  Her obstetrical history is significant for previous term SVD. Patient does intend to breast feed. Pregnancy history fully reviewed.  Patient reports nausea and vomiting.  Vitals:   04/10/17 0936  BP: 112/71  Pulse: 90  Weight: 69.4 kg (153 lb)    HISTORY: OB History  Gravida Para Term Preterm AB Living  2 1 1     1   SAB TAB Ectopic Multiple Live Births          1    # Outcome Date GA Lbr Len/2nd Weight Sex Delivery Anes PTL Lv  2 Current           1 Term 12/15/12 6944w4d 08:27 / 00:26 3.025 kg (6 lb 10.7 oz) F Vag-Spont EPI  LIV     Past Medical History:  Diagnosis Date  . Chlamydia 01/18/2017  . Gonorrhea 2015 and 2017  . Hx of migraines    Past Surgical History:  Procedure Laterality Date  . WISDOM TOOTH EXTRACTION     Family History  Problem Relation Age of Onset  . Hypertension Maternal Grandmother      Exam    Uterus:   15 week size  Pelvic Exam:    Perineum: Deferred per her request, has had pelvic exam in [facility], pap not indicated                       Bony Pelvis:   System: Breast:  normal appearance, no masses or tenderness   Skin: normal coloration and turgor, no rashes    Neurologic: oriented, normal mood   Extremities: normal strength, tone, and muscle mass   HEENT PERRLA and extra ocular movement intact   Mouth/Teeth mucous membranes moist, pharynx normal without lesions and dental hygiene good   Neck supple   Cardiovascular: regular rate and rhythm, no murmurs or gallops   Respiratory:  appears well, vitals normal, no respiratory distress, acyanotic, normal RR, neck free of mass or lymphadenopathy, chest clear, no wheezing, crepitations, rhonchi, normal symmetric air entry   Abdomen: soft, non-tender; bowel sounds normal; no masses,  no organomegaly   Urinary:        Assessment:    Pregnancy: G2P1001 Patient Active Problem List   Diagnosis Date Noted  . Encounter for supervision of normal pregnancy, antepartum 04/10/2017  . Chlamydia infection 01/22/2017        Plan:     Initial labs drawn. Prenatal vitamins. Problem list reviewed and updated. Genetic Screening discussed materniT21 and AFP  Ultrasound discussed; fetal survey: ordered.  Follow up in 4 weeks. 50% of 30 min visit spent on counseling and coordination of care.  Diclegis for nausea   Scheryl DarterJames Arnold 04/10/2017

## 2017-04-10 NOTE — Patient Instructions (Signed)
Second Trimester of Pregnancy The second trimester is from week 13 through week 28, month 4 through 6. This is often the time in pregnancy that you feel your best. Often times, morning sickness has lessened or quit. You may have more energy, and you may get hungry more often. Your unborn baby (fetus) is growing rapidly. At the end of the sixth month, he or she is about 9 inches long and weighs about 1 pounds. You will likely feel the baby move (quickening) between 18 and 20 weeks of pregnancy. Follow these instructions at home:  Avoid all smoking, herbs, and alcohol. Avoid drugs not approved by your doctor.  Do not use any tobacco products, including cigarettes, chewing tobacco, and electronic cigarettes. If you need help quitting, ask your doctor. You may get counseling or other support to help you quit.  Only take medicine as told by your doctor. Some medicines are safe and some are not during pregnancy.  Exercise only as told by your doctor. Stop exercising if you start having cramps.  Eat regular, healthy meals.  Wear a good support bra if your breasts are tender.  Do not use hot tubs, steam rooms, or saunas.  Wear your seat belt when driving.  Avoid raw meat, uncooked cheese, and liter boxes and soil used by cats.  Take your prenatal vitamins.  Take 1500-2000 milligrams of calcium daily starting at the 20th week of pregnancy until you deliver your baby.  Try taking medicine that helps you poop (stool softener) as needed, and if your doctor approves. Eat more fiber by eating fresh fruit, vegetables, and whole grains. Drink enough fluids to keep your pee (urine) clear or pale yellow.  Take warm water baths (sitz baths) to soothe pain or discomfort caused by hemorrhoids. Use hemorrhoid cream if your doctor approves.  If you have puffy, bulging veins (varicose veins), wear support hose. Raise (elevate) your feet for 15 minutes, 3-4 times a day. Limit salt in your diet.  Avoid heavy  lifting, wear low heals, and sit up straight.  Rest with your legs raised if you have leg cramps or low back pain.  Visit your dentist if you have not gone during your pregnancy. Use a soft toothbrush to brush your teeth. Be gentle when you floss.  You can have sex (intercourse) unless your doctor tells you not to.  Go to your doctor visits. Get help if:  You feel dizzy.  You have mild cramps or pressure in your lower belly (abdomen).  You have a nagging pain in your belly area.  You continue to feel sick to your stomach (nauseous), throw up (vomit), or have watery poop (diarrhea).  You have bad smelling fluid coming from your vagina.  You have pain with peeing (urination). Get help right away if:  You have a fever.  You are leaking fluid from your vagina.  You have spotting or bleeding from your vagina.  You have severe belly cramping or pain.  You lose or gain weight rapidly.  You have trouble catching your breath and have chest pain.  You notice sudden or extreme puffiness (swelling) of your face, hands, ankles, feet, or legs.  You have not felt the baby move in over an hour.  You have severe headaches that do not go away with medicine.  You have vision changes. This information is not intended to replace advice given to you by your health care provider. Make sure you discuss any questions you have with your health care   provider. Document Released: 10/24/2009 Document Revised: 01/05/2016 Document Reviewed: 09/30/2012 Elsevier Interactive Patient Education  2017 Elsevier Inc.  

## 2017-04-10 NOTE — Progress Notes (Signed)
Pt presents for N&V. Pt declines VE today.

## 2017-04-12 LAB — CULTURE, OB URINE

## 2017-04-12 LAB — URINE CULTURE, OB REFLEX

## 2017-04-13 LAB — HEMOGLOBINOPATHY EVALUATION
HEMOGLOBIN F QUANTITATION: 0 % (ref 0.0–2.0)
HGB C: 0 %
HGB S: 0 %
HGB VARIANT: 0 %
Hemoglobin A2 Quantitation: 1.8 % (ref 1.8–3.2)
Hgb A: 98.2 % (ref 96.4–98.8)

## 2017-04-13 LAB — OBSTETRIC PANEL, INCLUDING HIV
ANTIBODY SCREEN: NEGATIVE
Basophils Absolute: 0 10*3/uL (ref 0.0–0.2)
Basos: 0 %
EOS (ABSOLUTE): 0.1 10*3/uL (ref 0.0–0.4)
Eos: 1 %
HIV Screen 4th Generation wRfx: NONREACTIVE
Hematocrit: 30.7 % — ABNORMAL LOW (ref 34.0–46.6)
Hemoglobin: 8.6 g/dL — ABNORMAL LOW (ref 11.1–15.9)
Hepatitis B Surface Ag: NEGATIVE
IMMATURE GRANS (ABS): 0 10*3/uL (ref 0.0–0.1)
IMMATURE GRANULOCYTES: 0 %
LYMPHS ABS: 1.6 10*3/uL (ref 0.7–3.1)
LYMPHS: 19 %
MCH: 17.3 pg — AB (ref 26.6–33.0)
MCHC: 28 g/dL — AB (ref 31.5–35.7)
MCV: 62 fL — ABNORMAL LOW (ref 79–97)
MONOS ABS: 0.4 10*3/uL (ref 0.1–0.9)
Monocytes: 4 %
NEUTROS PCT: 76 %
Neutrophils Absolute: 6.3 10*3/uL (ref 1.4–7.0)
Platelets: 378 10*3/uL (ref 150–379)
RBC: 4.96 x10E6/uL (ref 3.77–5.28)
RDW: 21.5 % — AB (ref 12.3–15.4)
RPR Ser Ql: NONREACTIVE
Rh Factor: POSITIVE
Rubella Antibodies, IGG: 5 index (ref >0.99)
WBC: 8.4 10*3/uL (ref 3.4–10.8)

## 2017-04-15 LAB — MATERNIT21  PLUS CORE+ESS+SCA, BLOOD
Chromosome 13: NEGATIVE
Chromosome 18: NEGATIVE
Chromosome 21: NEGATIVE
Y CHROMOSOME: DETECTED

## 2017-04-16 ENCOUNTER — Telehealth: Payer: Self-pay

## 2017-04-16 NOTE — Telephone Encounter (Signed)
Pt called wanting to review lab results

## 2017-04-17 LAB — CYSTIC FIBROSIS MUTATION 97: Interpretation: NOT DETECTED

## 2017-04-21 ENCOUNTER — Encounter: Payer: Self-pay | Admitting: Family Medicine

## 2017-04-21 DIAGNOSIS — O0931 Supervision of pregnancy with insufficient antenatal care, first trimester: Secondary | ICD-10-CM | POA: Insufficient documentation

## 2017-04-21 DIAGNOSIS — O99019 Anemia complicating pregnancy, unspecified trimester: Secondary | ICD-10-CM | POA: Insufficient documentation

## 2017-04-24 LAB — AFP, SERUM, OPEN SPINA BIFIDA
AFP MoM: 0.84
AFP VALUE AFPOSL: 29.4 ng/mL
GEST. AGE ON COLLECTION DATE: 15.9 wk
Maternal Age At EDD: 21.2 yr
OSBR RISK 1 IN: 10000
Test Results:: NEGATIVE
WEIGHT: 153 [lb_av]

## 2017-05-01 ENCOUNTER — Other Ambulatory Visit: Payer: Self-pay | Admitting: Obstetrics & Gynecology

## 2017-05-01 ENCOUNTER — Ambulatory Visit (HOSPITAL_COMMUNITY)
Admission: RE | Admit: 2017-05-01 | Discharge: 2017-05-01 | Disposition: A | Discharge status: Home / Self Care | Payer: Medicaid Other | Source: Ambulatory Visit | Attending: Obstetrics & Gynecology | Admitting: Obstetrics & Gynecology

## 2017-05-01 DIAGNOSIS — Z3A18 18 weeks gestation of pregnancy: Secondary | ICD-10-CM

## 2017-05-01 DIAGNOSIS — Z3480 Encounter for supervision of other normal pregnancy, unspecified trimester: Secondary | ICD-10-CM

## 2017-05-01 DIAGNOSIS — Z3482 Encounter for supervision of other normal pregnancy, second trimester: Secondary | ICD-10-CM | POA: Diagnosis present

## 2017-05-08 ENCOUNTER — Ambulatory Visit (INDEPENDENT_AMBULATORY_CARE_PROVIDER_SITE_OTHER): Payer: Medicaid Other | Admitting: Obstetrics & Gynecology

## 2017-05-08 VITALS — BP 106/61 | HR 69 | Wt 160.0 lb

## 2017-05-08 DIAGNOSIS — Z3480 Encounter for supervision of other normal pregnancy, unspecified trimester: Secondary | ICD-10-CM

## 2017-05-08 DIAGNOSIS — Z3482 Encounter for supervision of other normal pregnancy, second trimester: Secondary | ICD-10-CM

## 2017-05-08 MED ORDER — DOCUSATE SODIUM 100 MG PO CAPS: 100.0000 mg | ORAL_CAPSULE | Freq: Two times a day (BID) | ORAL | 2 refills | Status: DC | PRN

## 2017-05-08 NOTE — Progress Notes (Signed)
   PRENATAL VISIT NOTE  Subjective:  Alexandra Lowery is a 21 y.o. G2P1001 at [redacted]w[redacted]d being seen today for ongoing prenatal care.  She is currently monitored for the following issues for this low-risk pregnancy and has Chlamydia infection affecting pregnancy in first trimester; Encounter for supervision of normal pregnancy, antepartum; Insufficient prenatal care in first trimester; and Anemia affecting pregnancy, antepartum on her problem list.  Patient reports constipation.  Contractions: Not present. Vag. Bleeding: None.  Movement: Present. Denies leaking of fluid.   The following portions of the patient's history were reviewed and updated as appropriate: allergies, current medications, past family history, past medical history, past social history, past surgical history and problem list. Problem list updated.  Objective:   Vitals:   05/08/17 0937  BP: 106/61  Pulse: 69  Weight: 160 lb (72.6 kg)    Fetal Status: Fetal Heart Rate (bpm): 140   Movement: Present     General:  Alert, oriented and cooperative. Patient is in no acute distress.  Skin: Skin is warm and dry. No rash noted.   Cardiovascular: Normal heart rate noted  Respiratory: Normal respiratory effort, no problems with respiration noted  Abdomen: Soft, gravid, appropriate for gestational age.  Pain/Pressure: Absent     Pelvic: Cervical exam deferred        Extremities: Normal range of motion.     Mental Status:  Normal mood and affect. Normal behavior. Normal judgment and thought content.   Assessment and Plan:  Pregnancy: G2P1001 at [redacted]w[redacted]d  1. Supervision of other normal pregnancy, antepartum constipation - [place] MFM OB FOLLOW UP; Future - docusate sodium (COLACE) 100 MG capsule; Take 1 capsule (100 mg total) by mouth 2 (two) times daily as needed.  Dispense: 30 capsule; Refill: 2  Preterm labor symptoms and general obstetric precautions including but not limited to vaginal bleeding, contractions, leaking of fluid and  fetal movement were reviewed in detail with the patient. Please refer to After Visit Summary for other counseling recommendations.  Return in about 4 weeks (around 06/05/2017). Repeat US for anatomy in 3 weeks  Scheryl Darter, MD

## 2017-05-08 NOTE — Patient Instructions (Signed)

## 2017-05-08 NOTE — Progress Notes (Signed)
Pt states that she is having some constipation. ?Colace Rx. Pt will need repeat u/s in 4 weeks per recommendation from u/s on 05/01/17.

## 2017-05-29 ENCOUNTER — Ambulatory Visit (HOSPITAL_COMMUNITY)
Admission: RE | Admit: 2017-05-29 | Discharge: 2017-05-29 | Disposition: A | Discharge status: Home / Self Care | Payer: Medicaid Other | Source: Ambulatory Visit | Attending: Obstetrics & Gynecology | Admitting: Obstetrics & Gynecology

## 2017-05-29 DIAGNOSIS — Z3480 Encounter for supervision of other normal pregnancy, unspecified trimester: Secondary | ICD-10-CM | POA: Diagnosis present

## 2017-05-29 DIAGNOSIS — Z362 Encounter for other antenatal screening follow-up: Secondary | ICD-10-CM | POA: Insufficient documentation

## 2017-06-01 ENCOUNTER — Encounter: Payer: Self-pay | Admitting: Obstetrics & Gynecology

## 2017-06-01 ENCOUNTER — Inpatient Hospital Stay (HOSPITAL_COMMUNITY)
Admission: AD | Admit: 2017-06-01 | Discharge: 2017-06-02 | Disposition: A | Discharge status: Home / Self Care | Payer: Medicaid Other | Source: Ambulatory Visit | Attending: Obstetrics and Gynecology | Admitting: Obstetrics and Gynecology

## 2017-06-01 DIAGNOSIS — O99612 Diseases of the digestive system complicating pregnancy, second trimester: Secondary | ICD-10-CM

## 2017-06-01 DIAGNOSIS — K5900 Constipation, unspecified: Secondary | ICD-10-CM | POA: Diagnosis not present

## 2017-06-01 DIAGNOSIS — Z3A23 23 weeks gestation of pregnancy: Secondary | ICD-10-CM | POA: Insufficient documentation

## 2017-06-01 DIAGNOSIS — O4700 False labor before 37 completed weeks of gestation, unspecified trimester: Secondary | ICD-10-CM

## 2017-06-01 DIAGNOSIS — B3731 Acute candidiasis of vulva and vagina: Secondary | ICD-10-CM

## 2017-06-01 DIAGNOSIS — O26892 Other specified pregnancy related conditions, second trimester: Secondary | ICD-10-CM | POA: Diagnosis not present

## 2017-06-01 DIAGNOSIS — R109 Unspecified abdominal pain: Secondary | ICD-10-CM | POA: Diagnosis present

## 2017-06-01 DIAGNOSIS — O4702 False labor before 37 completed weeks of gestation, second trimester: Secondary | ICD-10-CM | POA: Diagnosis not present

## 2017-06-01 DIAGNOSIS — O479 False labor, unspecified: Secondary | ICD-10-CM

## 2017-06-01 DIAGNOSIS — B9689 Other specified bacterial agents as the cause of diseases classified elsewhere: Secondary | ICD-10-CM | POA: Diagnosis not present

## 2017-06-01 DIAGNOSIS — B373 Candidiasis of vulva and vagina: Secondary | ICD-10-CM | POA: Diagnosis not present

## 2017-06-01 DIAGNOSIS — N760 Acute vaginitis: Secondary | ICD-10-CM

## 2017-06-01 LAB — URINALYSIS, ROUTINE W REFLEX MICROSCOPIC
Bilirubin Urine: NEGATIVE
Glucose, UA: NEGATIVE mg/dL
Hgb urine dipstick: NEGATIVE
Ketones, ur: NEGATIVE mg/dL
Nitrite: NEGATIVE
Protein, ur: NEGATIVE mg/dL
RBC / HPF: NONE SEEN RBC/hpf (ref 0–5)
Specific Gravity, Urine: 1.014 (ref 1.005–1.030)
Squamous Epithelial / HPF: NONE SEEN
pH: 7 (ref 5.0–8.0)

## 2017-06-01 LAB — FETAL FIBRONECTIN: Fetal Fibronectin: NEGATIVE

## 2017-06-01 LAB — WET PREP, GENITAL
Sperm: NONE SEEN
TRICH WET PREP: NONE SEEN

## 2017-06-01 MED ORDER — METRONIDAZOLE 500 MG PO TABS: 500.0000 mg | ORAL_TABLET | Freq: Two times a day (BID) | ORAL | 0 refills | Status: DC

## 2017-06-01 MED ORDER — TERCONAZOLE 0.4 % VA CREA: 1.0000 | TOPICAL_CREAM | Freq: Every day | VAGINAL | 1 refills | Status: DC

## 2017-06-01 MED ORDER — POLYETHYLENE GLYCOL 3350 17 G PO PACK
17.0000 g | PACK | Freq: Once | ORAL | Status: AC
  Administered 2017-06-01: 17 g via ORAL
  Filled 2017-06-01: qty 1

## 2017-06-01 MED ORDER — POLYETHYLENE GLYCOL 3350 17 GM/SCOOP PO POWD: 17.0000 g | Freq: Two times a day (BID) | ORAL | 2 refills | Status: DC | PRN

## 2017-06-01 NOTE — [facility] Provider Note (Signed)
CC:  Chief Complaint  Patient presents with  . Abdominal Pain     First Provider Initiated Contact with Patient 06/01/17 2232      HPI: Alexandra Lowery is a 21 y.o. year old 382P1001 female at 8443w2d weeks gestation who presents to [facility] reporting contractions several times per hour and continuous cramping that last more than an hour at a time  Onset: 05/31/17 Duration: 2 days Intensity: Mild-mod Quality: cramping There are no aggravating or aleviating factors. Associated Sx: Pos for vaginal discharge and constipation. Neg for fever, chills, urinary complaints.   Vaginal bleeding: Denies  Leaking of fluid: Denies  Fetal movement: Nml  Significant constipation since beginning of pregnancy. No improvement w/ Colace or pushing fluids. Eats low fiber diet--mostly TV dinners.   O:  Patient Vitals for the past 24 hrs:  BP Temp Temp src Pulse Resp SpO2 Height Weight  06/01/17 2202 105/61 - - 89 16 100 % - -  06/01/17 2201 - - - - - 100 % - -  06/01/17 2200 - 98.6 F (37 C) Oral - 16 100 % - -  06/01/17 2155 - - - - - 99 % - -  06/01/17 2100 - - - - - - 5\' 9"  (1.753 m) 163 lb (73.9 kg)    General: NAD Heart: Regular rate Lungs: Normal rate and effort Abd: Soft, NT, Gravid, S=D Pelvic: NEFG, Neg pooling, Neg  blood. Visually closed and long. Parous-looking. Moderate amount of thick, white, malodorous discharge. Dilation: Closed Effacement (%): Thick Cervical Position: Middle Station: Ballotable Presentation: Undeterminable Exam by:: Renee RivalVirgina Marna Weniger CNM  EFM: 145, min-Moderate variability, no accelerations, no decelerations. reassuring for gestational age.  Toco: Contractions: None traced or palpated.   Results for orders placed or performed during the hospital encounter of 06/01/17 (from the past 24 hour(s))  Urinalysis, Routine w reflex microscopic     Status: Abnormal   Collection Time: 06/01/17 10:08 PM  Result Value Ref Range   Color, Urine YELLOW YELLOW   APPearance  TURBID (A) CLEAR   Specific Gravity, Urine 1.014 1.005 - 1.030   pH 7.0 5.0 - 8.0   Glucose, UA NEGATIVE NEGATIVE mg/dL   Hgb urine dipstick NEGATIVE NEGATIVE   Bilirubin Urine NEGATIVE NEGATIVE   Ketones, ur NEGATIVE NEGATIVE mg/dL   Protein, ur NEGATIVE NEGATIVE mg/dL   Nitrite NEGATIVE NEGATIVE   Leukocytes, UA SMALL (A) NEGATIVE   RBC / HPF NONE SEEN 0 - 5 RBC/hpf   WBC, UA 0-5 0 - 5 WBC/hpf   Bacteria, UA RARE (A) NONE SEEN   Squamous Epithelial / LPF NONE SEEN NONE SEEN   Mucus PRESENT    Amorphous Crystal PRESENT   Wet prep, genital     Status: Abnormal   Collection Time: 06/01/17 10:40 PM  Result Value Ref Range   Yeast Wet Prep HPF POC PRESENT (A) NONE SEEN   Trich, Wet Prep NONE SEEN NONE SEEN   Clue Cells Wet Prep HPF POC PRESENT (A) NONE SEEN   WBC, Wet Prep HPF POC MANY (A) NONE SEEN   Sperm NONE SEEN   Fetal fibronectin     Status: None   Collection Time: 06/01/17 10:40 PM  Result Value Ref Range   Fetal Fibronectin NEGATIVE NEGATIVE     Orders Placed This Encounter  Procedures  . Wet prep, genital  . Urinalysis, Routine w reflex microscopic  . Fetal fibronectin  . Discharge patient   Meds ordered this encounter  Medications  .  polyethylene glycol (MIRALAX / GLYCOLAX) packet 17 g  . polyethylene glycol powder (GLYCOLAX/MIRALAX) powder    Sig: Take 17 g by mouth 2 (two) times daily as needed for moderate constipation.    Dispense:  500 g    Refill:  2    Order Specific Question:   Supervising Provider    Answer:   CONSTANT, PEGGY [[id number]]  . terconazole (TERAZOL 7) 0.4 % vaginal cream    Sig: Place 1 applicator vaginally at bedtime.    Dispense:  45 g    Refill:  1    Order Specific Question:   Supervising Provider    Answer:   CONSTANT, PEGGY [[id number]]  . metroNIDAZOLE (FLAGYL) 500 MG tablet    Sig: Take 1 tablet (500 mg total) by mouth 2 (two) times daily.    Dispense:  14 tablet    Refill:  0    Order Specific Question:   Supervising Provider     Answer:   CONSTANT, PEGGY [[id number]]    MDM Cramping due to combination of constipation and preterm contractions w/out evidence of active Preterm labor. fFN negative. No evidence of emergent condition.   A: [redacted]w[redacted]d week IUP Preterm contractions Constipation FHR reactive  P: Discharge home in stable condition per consult with Constant, Peggy, MD. Preterm labor precautions and fetal kick counts. Rx Terazol, Flagyl, Miralax--BID x 2 days, then PRN.  Increase fluid and fiber. Discussed healthy diet, increasing veggie, fruit.  Follow-up as scheduled for prenatal visit or sooner as needed if symptoms worsen. Return to maternity admissions as needed if symptoms worsen.  Katrinka Blazing, [place], CNM 06/01/2017 11:41 PM  3

## 2017-06-01 NOTE — Progress Notes (Addendum)
G2P1 @ 23.[redacted] wksga. Presents to triage for cramps. Denies LOF or bleeding. + FM. EFM applied. VSS see flow sheet for details  2204: Tranducer adjusted.  2237: Provider at bs assessing. GC, wetprep, and FFN done.

## 2017-06-01 NOTE — Discharge Instructions (Signed)
High-Fiber Diet Fiber, also called dietary fiber, is a type of carbohydrate found in fruits, vegetables, whole grains, and beans. A high-fiber diet can have many health benefits. Your health care provider may recommend a high-fiber diet to help:  Prevent constipation. Fiber can make your bowel movements more regular.  Lower your cholesterol.  Relieve hemorrhoids, uncomplicated diverticulosis, or irritable bowel syndrome.  Prevent overeating as part of a weight-loss plan.  Prevent heart disease, type 2 diabetes, and certain cancers.  What is my plan? The recommended daily intake of fiber includes:  38 grams for men under age 67.  31 grams for men over age 71.  61 grams for women under age 60.  32 grams for women over age 13.  You can get the recommended daily intake of dietary fiber by eating a variety of fruits, vegetables, grains, and beans. Your health care provider may also recommend a fiber supplement if it is not possible to get enough fiber through your diet. What do I need to know about a high-fiber diet?  Fiber supplements have not been widely studied for their effectiveness, so it is better to get fiber through food sources.  Always check the fiber content on thenutrition facts label of any prepackaged food. Look for foods that contain at least 5 grams of fiber per serving.  Ask your dietitian if you have questions about specific foods that are related to your condition, especially if those foods are not listed in the following section.  Increase your daily fiber consumption gradually. Increasing your intake of dietary fiber too quickly may cause bloating, cramping, or gas.  Drink plenty of water. Water helps you to digest fiber. What foods can I eat? Grains Whole-grain breads. Multigrain cereal. Oats and oatmeal. Brown rice. Barley. Bulgur wheat. [place]. Bran muffins. Popcorn. Rye wafer crackers. Vegetables Sweet potatoes. Spinach. Kale. Artichokes. Cabbage.  Broccoli. Green peas. Carrots. Squash. Fruits Berries. Pears. Apples. Oranges. Avocados. Prunes and raisins. Dried figs. Meats and Other Protein Sources [place], kidney, pinto, and soy beans. Split peas. Lentils. Nuts and seeds. Dairy Fiber-fortified yogurt. Beverages Fiber-fortified soy milk. Fiber-fortified orange juice. Other Fiber bars. The items listed above may not be a complete list of recommended foods or beverages. Contact your dietitian for more options. What foods are not recommended? Grains White bread. Pasta made with refined flour. White rice. Vegetables Fried potatoes. Canned vegetables. Well-cooked vegetables. Fruits Fruit juice. Cooked, strained fruit. Meats and Other Protein Sources Fatty cuts of meat. Fried Sales executive or fried fish. Dairy Milk. Yogurt. Cream cheese. Sour cream. Beverages Soft drinks. Other Cakes and pastries. Butter and oils. The items listed above may not be a complete list of foods and beverages to avoid. Contact your dietitian for more information. What are some tips for including high-fiber foods in my diet?  Eat a wide variety of high-fiber foods.  Make sure that half of all grains consumed each day are whole grains.  Replace breads and cereals made from refined flour or white flour with whole-grain breads and cereals.  Replace white rice with brown rice, bulgur wheat, or millet.  Start the day with a breakfast that is high in fiber, such as a cereal that contains at least 5 grams of fiber per serving.  Use beans in place of meat in soups, salads, or pasta.  Eat high-fiber snacks, such as berries, raw vegetables, nuts, or popcorn. This information is not intended to replace advice given to you by your health care provider. Make sure you discuss any  questions you have with your health care provider. Document Released: 07/30/2005 Document Revised: 01/05/2016 Document Reviewed: 01/12/2014 Elsevier Interactive Patient Education  2017  [employer]   Constipation, Adult Constipation is when a person has fewer bowel movements in a week than normal, has difficulty having a bowel movement, or has stools that are dry, hard, or larger than normal. Constipation may be caused by an underlying condition. It may become worse with age if a person takes certain medicines and does not take in enough fluids. Follow these instructions at home: Eating and drinking   Eat foods that have a lot of fiber, such as fresh fruits and vegetables, whole grains, and beans.  Limit foods that are high in fat, low in fiber, or overly processed, such as french fries, hamburgers, cookies, candies, and soda.  Drink enough fluid to keep your urine clear or pale yellow. General instructions  Exercise regularly or as told by your health care provider.  Go to the restroom when you have the urge to go. Do not hold it in.  Take over-the-counter and prescription medicines only as told by your health care provider. These include any fiber supplements.  Practice pelvic floor retraining exercises, such as deep breathing while relaxing the lower abdomen and pelvic floor relaxation during bowel movements.  Watch your condition for any changes.  Keep all follow-up visits as told by your health care provider. This is important. Contact a health care provider if:  You have pain that gets worse.  You have a fever.  You do not have a bowel movement after 4 days.  You vomit.  You are not hungry.  You lose weight.  You are bleeding from the anus.  You have thin, pencil-like stools. Get help right away if:  You have a fever and your symptoms suddenly get worse.  You leak stool or have blood in your stool.  Your abdomen is bloated.  You have severe pain in your abdomen.  You feel dizzy or you faint. This information is not intended to replace advice given to you by your health care provider. Make sure you discuss any questions you have with  your health care provider. Document Released: 04/27/2004 Document Revised: 02/17/2016 Document Reviewed: 01/18/2016 Elsevier Interactive Patient Education  2017 Elsevier Inc.   Abdominal Pain During Pregnancy Abdominal pain is common in pregnancy. Most of the time, it does not cause harm. There are many causes of abdominal pain. Some causes are more serious than others and sometimes the cause is not known. Abdominal pain can be a sign that something is very wrong with the pregnancy or the pain may have nothing to do with the pregnancy. Always tell your health care provider if you have any abdominal pain. Follow these instructions at home:  Do not have sex or put anything in your vagina until your symptoms go away completely.  Watch your abdominal pain for any changes.  Get plenty of rest until your pain improves.  Drink enough fluid to keep your urine clear or pale yellow.  Take over-the-counter or prescription medicines only as told by your health care provider.  Keep all follow-up visits as told by your health care provider. This is important. Contact a health care provider if:  You have a fever.  Your pain gets worse or you have cramping.  Your pain continues after resting. Get help right away if:  You are bleeding, leaking fluid, or passing tissue from the vagina.  You have vomiting or diarrhea that  does not go away.  You have painful or bloody urination.  You notice a decrease in your baby's movements.  You feel very weak or faint.  You have shortness of breath.  You develop a severe headache with abdominal pain.  You have abnormal vaginal discharge with abdominal pain. This information is not intended to replace advice given to you by your health care provider. Make sure you discuss any questions you have with your health care provider. Document Released: 07/30/2005 Document Revised: 05/10/2016 Document Reviewed: 02/26/2013 Elsevier Interactive Patient Education   [employer].

## 2017-06-03 LAB — GC/CHLAMYDIA PROBE AMP (~~LOC~~) NOT AT ARMC
CHLAMYDIA, DNA PROBE: NEGATIVE
Neisseria Gonorrhea: NEGATIVE

## 2017-06-05 ENCOUNTER — Encounter: Payer: Medicaid Other | Admitting: Obstetrics & Gynecology

## 2017-06-24 ENCOUNTER — Ambulatory Visit (INDEPENDENT_AMBULATORY_CARE_PROVIDER_SITE_OTHER): Payer: Medicaid Other | Admitting: Obstetrics and Gynecology

## 2017-06-24 ENCOUNTER — Encounter: Payer: Self-pay | Admitting: Obstetrics and Gynecology

## 2017-06-24 VITALS — BP 106/70 | HR 118 | Wt 172.0 lb

## 2017-06-24 DIAGNOSIS — Z3482 Encounter for supervision of other normal pregnancy, second trimester: Secondary | ICD-10-CM

## 2017-06-24 DIAGNOSIS — Z3480 Encounter for supervision of other normal pregnancy, unspecified trimester: Secondary | ICD-10-CM

## 2017-06-24 MED ORDER — POLYETHYLENE GLYCOL 3350 17 GM/SCOOP PO POWD: 17.0000 g | Freq: Two times a day (BID) | ORAL | 2 refills | Status: DC | PRN

## 2017-06-24 MED ORDER — TERCONAZOLE 0.4 % VA CREA: 1.0000 | TOPICAL_CREAM | Freq: Every day | VAGINAL | 1 refills | Status: DC

## 2017-06-24 MED ORDER — VITAFOL GUMMIES 3.33-0.333-34.8 MG PO CHEW: 2.0000 | CHEWABLE_TABLET | Freq: Every day | ORAL | 6 refills | Status: AC

## 2017-06-24 MED ORDER — METRONIDAZOLE 500 MG PO TABS: 500.0000 mg | ORAL_TABLET | Freq: Two times a day (BID) | ORAL | 0 refills | Status: DC

## 2017-06-24 NOTE — Progress Notes (Signed)
Pt states she needs all rxs from 06/01/17 resent-[facility] sent them to the incorrect pharmacy.  Flagyl, Terazole, and MiraLax. Pt reports not taking PNV because they make her vomit.

## 2017-06-24 NOTE — Progress Notes (Signed)
   PRENATAL VISIT NOTE  Subjective:  Alexandra Lowery is a 21 y.o. G2P1001 at 4951w4d being seen today for ongoing prenatal care.  She is currently monitored for the following issues for this low-risk pregnancy and has Chlamydia infection affecting pregnancy in first trimester; Encounter for supervision of normal pregnancy, antepartum; Insufficient prenatal care in first trimester; and Anemia affecting pregnancy, antepartum on their problem list.  Patient reports no complaints.  Contractions: Not present. Vag. Bleeding: None.  Movement: Present. Denies leaking of fluid.   The following portions of the patient's history were reviewed and updated as appropriate: allergies, current medications, past family history, past medical history, past social history, past surgical history and problem list. Problem list updated.  Objective:   Vitals:   06/24/17 1258  BP: 106/70  Pulse: (!) 118  Weight: 172 lb (78 kg)    Fetal Status: Fetal Heart Rate (bpm): 151 Fundal Height: 27 cm Movement: Present     General:  Alert, oriented and cooperative. Patient is in no acute distress.  Skin: Skin is warm and dry. No rash noted.   Cardiovascular: Normal heart rate noted  Respiratory: Normal respiratory effort, no problems with respiration noted  Abdomen: Soft, gravid, appropriate for gestational age.  Pain/Pressure: Absent     Pelvic: Cervical exam deferred        Extremities: Normal range of motion.  Edema: None  Mental Status:  Normal mood and affect. Normal behavior. Normal judgment and thought content.   Assessment and Plan:  Pregnancy: G2P1001 at 4551w4d  1. Supervision of other normal pregnancy, antepartum Patient is doing well without complaints Patient declined flu vaccine Third trimester labs, glucola and tdap next visit  Preterm labor symptoms and general obstetric precautions including but not limited to vaginal bleeding, contractions, leaking of fluid and fetal movement were reviewed in  detail with the patient. Please refer to After Visit Summary for other counseling recommendations.  Return in about 2 weeks (around 07/08/2017) for ROB, 2 hr glucola next visit.   Catalina AntiguaPeggy Danzell Birky, MD

## 2017-07-11 ENCOUNTER — Encounter: Payer: Medicaid Other | Admitting: Obstetrics and Gynecology

## 2017-07-11 ENCOUNTER — Other Ambulatory Visit: Payer: Medicaid Other

## 2017-07-23 ENCOUNTER — Inpatient Hospital Stay (HOSPITAL_COMMUNITY): Payer: Medicaid Other

## 2017-07-23 ENCOUNTER — Inpatient Hospital Stay (HOSPITAL_COMMUNITY)
Admission: AD | Admit: 2017-07-23 | Discharge: 2017-07-23 | Disposition: A | Discharge status: Home / Self Care | Payer: Medicaid Other | Source: Ambulatory Visit | Attending: Obstetrics & Gynecology | Admitting: Obstetrics & Gynecology

## 2017-07-23 ENCOUNTER — Encounter (HOSPITAL_COMMUNITY): Payer: Self-pay

## 2017-07-23 ENCOUNTER — Encounter: Payer: Self-pay | Admitting: Obstetrics and Gynecology

## 2017-07-23 ENCOUNTER — Encounter: Payer: Self-pay | Admitting: Obstetrics & Gynecology

## 2017-07-23 ENCOUNTER — Inpatient Hospital Stay (EMERGENCY_DEPARTMENT_HOSPITAL)
Admission: AD | Admit: 2017-07-23 | Discharge: 2017-07-23 | Disposition: A | Discharge status: Home / Self Care | Payer: Medicaid Other | Source: Ambulatory Visit | Attending: Obstetrics & Gynecology | Admitting: Obstetrics & Gynecology

## 2017-07-23 DIAGNOSIS — O99019 Anemia complicating pregnancy, unspecified trimester: Secondary | ICD-10-CM

## 2017-07-23 DIAGNOSIS — O0933 Supervision of pregnancy with insufficient antenatal care, third trimester: Secondary | ICD-10-CM | POA: Insufficient documentation

## 2017-07-23 DIAGNOSIS — O479 False labor, unspecified: Secondary | ICD-10-CM

## 2017-07-23 DIAGNOSIS — Z3A30 30 weeks gestation of pregnancy: Secondary | ICD-10-CM | POA: Insufficient documentation

## 2017-07-23 DIAGNOSIS — N814 Uterovaginal prolapse, unspecified: Secondary | ICD-10-CM

## 2017-07-23 DIAGNOSIS — O4703 False labor before 37 completed weeks of gestation, third trimester: Secondary | ICD-10-CM | POA: Diagnosis not present

## 2017-07-23 DIAGNOSIS — O3483 Maternal care for other abnormalities of pelvic organs, third trimester: Secondary | ICD-10-CM | POA: Diagnosis not present

## 2017-07-23 DIAGNOSIS — O99013 Anemia complicating pregnancy, third trimester: Secondary | ICD-10-CM | POA: Insufficient documentation

## 2017-07-23 DIAGNOSIS — D649 Anemia, unspecified: Secondary | ICD-10-CM | POA: Diagnosis not present

## 2017-07-23 DIAGNOSIS — O4700 False labor before 37 completed weeks of gestation, unspecified trimester: Secondary | ICD-10-CM

## 2017-07-23 LAB — URINALYSIS, ROUTINE W REFLEX MICROSCOPIC
Bacteria, UA: NONE SEEN
Bilirubin Urine: NEGATIVE
Glucose, UA: NEGATIVE mg/dL
Hgb urine dipstick: NEGATIVE
KETONES UR: NEGATIVE mg/dL
Nitrite: NEGATIVE
PH: 7 (ref 5.0–8.0)
Protein, ur: NEGATIVE mg/dL
Specific Gravity, Urine: 1.014 (ref 1.005–1.030)
WBC UA: NONE SEEN WBC/hpf (ref 0–5)

## 2017-07-23 LAB — WET PREP, GENITAL
Sperm: NONE SEEN
TRICH WET PREP: NONE SEEN
YEAST WET PREP: NONE SEEN

## 2017-07-23 LAB — FETAL FIBRONECTIN: Fetal Fibronectin: NEGATIVE

## 2017-07-23 MED ORDER — FERROUS SULFATE 325 (65 FE) MG PO TABS: 325.0000 mg | ORAL_TABLET | Freq: Three times a day (TID) | ORAL | 1 refills | Status: DC

## 2017-07-23 NOTE — Discharge Instructions (Signed)
Pelvic Organ Prolapse Pelvic organ prolapse is the stretching, bulging, or dropping of pelvic organs into an abnormal position. It happens when the muscles and tissues that surround and support pelvic structures are stretched or weak. Pelvic organ prolapse can involve:  Vagina (vaginal prolapse).  Uterus (uterine prolapse).  Bladder (cystocele).  Rectum (rectocele).  Intestines (enterocele).  When organs other than the vagina are involved, they often bulge into the vagina or protrude from the vagina, depending on how severe the prolapse is. What are the causes? Causes of this condition include:  Pregnancy, labor, and childbirth.  Long-lasting (chronic) cough.  Chronic constipation.  Obesity.  Past pelvic surgery.  Aging. During and after menopause, a decreased production of the hormone estrogen can weaken pelvic ligaments and muscles.  Consistently lifting more than 50 lb (23 kg).  Buildup of fluid in the abdomen due to certain diseases and other conditions.  What are the signs or symptoms? Symptoms of this condition include:  Loss of bladder control when you cough, sneeze, strain, and exercise (stress incontinence). This may be worse immediately following childbirth, and it may gradually improve over time.  Feeling pressure in your pelvis or vagina. This pressure may increase when you cough or when you are having a bowel movement.  A bulge that protrudes from the opening of your vagina or against your vaginal wall. If your uterus protrudes through the opening of your vagina and rubs against your clothing, you may also experience soreness, ulcers, infection, pain, and bleeding.  Increased effort to have a bowel movement or urinate.  Pain in your low back.  Pain, discomfort, or disinterest in sexual intercourse.  Repeated bladder infections (urinary tract infections).  Difficulty inserting or inability to insert a tampon or applicator.  In some people, this  condition does not cause any symptoms. How is this diagnosed? Your health care provider may perform an internal and external vaginal and rectal exam. During the exam, you may be asked to cough and strain while you are lying down, sitting, and standing up. Your health care provider will determine if other tests are required, such as bladder function tests. How is this treated? In most cases, this condition needs to be treated only if it produces symptoms. No treatment is guaranteed to correct the prolapse or relieve the symptoms completely. Treatment may include:  Lifestyle changes, such as: ? Avoiding drinking beverages that contain caffeine. ? Increasing your intake of high-fiber foods. This can help to decrease constipation and straining during bowel movements. ? Emptying your bladder at scheduled times (bladder training therapy). This can help to reduce or avoid urinary incontinence. ? Losing weight if you are overweight or obese.  Estrogen. Estrogen may help mild prolapse by increasing the strength and tone of pelvic floor muscles.  Kegel exercises. These may help mild cases of prolapse by strengthening and tightening the muscles of the pelvic floor.  Pessary insertion. A pessary is a soft, flexible device that is placed into your vagina by your health care provider to help support the vaginal walls and keep pelvic organs in place.  Surgery. This is often the only form of treatment for severe prolapse. Different types of surgeries are available.  Follow these instructions at home:  Wear a sanitary pad or absorbent product if you have urinary incontinence.  Avoid heavy lifting and straining with exercise and work. Do not hold your breath when you perform mild to moderate lifting and exercise activities. Limit your activities as directed by your health care   provider.  Take medicines only as directed by your health care provider.  Perform Kegel exercises as directed by your health care  provider.  If you have a pessary, take care of it as directed by your health care provider. Contact a health care provider if:  Your symptoms interfere with your daily activities or sex life.  You need medicine to help with the discomfort.  You notice bleeding from the vagina that is not related to your period.  You have a fever.  You have pain or bleeding when you urinate.  You have bleeding when you have a bowel movement.  You lose urine when you have sex.  You have chronic constipation.  You have a pessary that falls out.  You have vaginal discharge that has a bad smell.  You have low abdominal pain or cramping that is unusual for you. This information is not intended to replace advice given to you by your health care provider. Make sure you discuss any questions you have with your health care provider. Document Released: 02/24/2014 Document Revised: 01/05/2016 Document Reviewed: 10/12/2013 Elsevier Interactive Patient Education  2018 Elsevier Inc.  

## 2017-07-23 NOTE — MAU Provider Note (Signed)
History  CSN: [id number] Arrival date and time: 07/23/17 [id number]  First Provider Initiated Contact with Patient 07/23/17 1957      Chief Complaint  Patient presents with  . Vaginal Prolapse    HPI: Alexandra Lowery is a 21 y.o. G2P1001 with IUP at 8091w5d who who seen here earlier today for evaluation of preterm contractions and reporting bulging membranes 5 days prior (see previous MAU note), who presents back as she states that after she left here she got in the shower, and taking in the shower she significant pressure and in her vagina and noticed some tissue bulging out of her vagina. Also noted some small amount of discharge. No large gush of fluid. Also report she has started contracting again. Reports good fetal movement. Denies any other concerns.   OB History  Gravida Para Term Preterm AB Living  2 1 1     1   SAB TAB Ectopic Multiple Live Births          1    # Outcome Date GA Lbr Len/2nd Weight Sex Delivery Anes PTL Lv  2 Current           1 Term 12/15/12 5043w4d 08:27 / 00:26 6 lb 10.7 oz (3.025 kg) F Vag-Spont EPI  LIV     Past Medical History:  Diagnosis Date  . Chlamydia 01/18/2017  . Gonorrhea 2015 and 2017  . Hx of migraines    Past Surgical History:  Procedure Laterality Date  . WISDOM TOOTH EXTRACTION     No Known Allergies  Medications Prior to Admission  Medication Sig Dispense Refill Last Dose  . acetaminophen (TYLENOL) 500 MG tablet Take 500 mg by mouth every 6 (six) hours as needed for mild pain.   Taking  . Doxylamine-Pyridoxine (DICLEGIS) 10-10 MG TBEC Take 1 tablet by mouth 2 (two) times daily as needed. (Patient not taking: Reported on 06/24/2017) 60 tablet 1 Not Taking  . ferrous sulfate 325 (65 FE) MG tablet Take 1 tablet (325 mg total) by mouth 3 (three) times daily with meals. 90 tablet 1   . metroNIDAZOLE (FLAGYL) 500 MG tablet Take 1 tablet (500 mg total) 2 (two) times daily by mouth. 14 tablet 0   . polyethylene glycol powder (GLYCOLAX/MIRALAX)  powder Take 17 g 2 (two) times daily as needed by mouth for moderate constipation. 500 g 2   . Prenatal Vit-Fe Fumarate-FA (PRENATAL MULTIVITAMIN) TABS tablet Take 1 tablet by mouth daily at 12 noon.   Not Taking  . Prenatal Vit-Fe Phos-FA-Omega (VITAFOL GUMMIES) 3.33-0.333-34.8 MG CHEW Chew 2 tablets daily by mouth. 60 tablet 6   . terconazole (TERAZOL 7) 0.4 % vaginal cream Place 1 applicator at bedtime vaginally. 45 g 1     I have reviewed patient's Past Medical Hx, Surgical Hx, Family Hx, Social Hx, medications and allergies.   Review of Systems: Negative except for what is mentioned in HPI.  Physical Exam   Blood pressure 114/64, pulse (!) 104, temperature 98.5 F (36.9 C), resp. rate 18, last menstrual period 12/20/2016, SpO2 100 %.  Constitutional: Well-developed, well-nourished female in no acute distress.  HENT: [place]/AT Eyes: normal conjunctivae, no scleral icterus Cardiovascular: mildly elevated rate Respiratory: normal effort  Pelvic: prolapsed cervix noted at vaginal introitus. This was pushed back, and SVE 2/thick/-3, unchanged from earlier today Neurologic: Alert and oriented x 4. Psych: Normal mood and affect Skin: warm and dry   FHT:  Baseline 145 , moderate variability, accelerations present, no decelerations (one variable  while pushing back cervix, otherwise no other variables noted.  Toco: mild IU  MAU Course/MDM:   Nursing notes and VS reviewed. Patient seen and examined, as noted above.  Monitored for 1 hour contractions. No contractions noted here.  Discussed noted prolapse with patient and management possibly with pessary, which needs to be done outpatient. She has an OB follow up 2 days.    Assessment and Plan  Assessment: 1. Vaginal and cervical prolapse     Plan: --Discharge home in stable condition.  --PTL precautions --F/u OB visit in 2 days. Will forward note to Dr. Debroah LoopArnold who will be seeing patient.    Degele, Kandra NicolasJulie P, MD 07/23/2017 8:25  PM  Future Appointments  Date Time Provider Department Center  07/25/2017  8:45 AM [facility] None  07/25/2017  9:00 AM Adam PhenixArnold, James G, MD [facility] None

## 2017-07-23 NOTE — MAU Note (Signed)
Pt started feeling pain and pressure before she got in the shower. Put patient in room and noticed tissue coming out of her vagina with discharge and liquid coming out.

## 2017-07-23 NOTE — Discharge Instructions (Signed)
Pregnancy and Anemia Anemia is a condition in which the concentration of red blood cells or hemoglobin in the blood is below normal. Hemoglobin is a substance in red blood cells that carries oxygen to the tissues of the body. Anemia results in not enough oxygen reaching these tissues. Anemia during pregnancy is common because the fetus uses more iron and folic acid as it is developing. Your body may not produce enough red blood cells because of this. Also, during pregnancy, the liquid part of the blood (plasma) increases by about 50%, and the red blood cells increase by only 25%. This lowers the concentration of the red blood cells and creates a natural anemia-like situation. What are the causes? The most common cause of anemia during pregnancy is not having enough iron in the body to make red blood cells (iron deficiency anemia). Other causes may include:  Folic acid deficiency.  Vitamin B12 deficiency.  Certain prescription or over-the-counter medicines.  Certain medical conditions or infections that destroy red blood cells.  A low platelet count and bleeding caused by antibodies that go through the placenta to the fetus from the mother's blood. What are the signs or symptoms? Mild anemia may not be noticeable. If it becomes severe, symptoms may include:  Tiredness.  Shortness of breath, especially with exercise.  Weakness.  Fainting.  Pale looking skin.  Headaches.  Feeling a fast or irregular heartbeat (palpitations). How is this diagnosed? The type of anemia is usually diagnosed from your family and medical history and blood tests. How is this treated? Treatment of anemia during pregnancy depends on the cause of the anemia. Treatment can include:  Supplements of iron, vitamin B12, or folic acid.  A blood transfusion. This may be needed if blood loss is severe.  Hospitalization. This may be needed if there is significant continual blood loss.  Dietary changes. Follow  these instructions at home:  Follow your dietitian's or health care provider's dietary recommendations.  Increase your vitamin C intake. This will help the stomach absorb more iron.  Eat a diet rich in iron. This would include foods such as:  Liver.  Beef.  Whole grain bread.  Eggs.  Dried fruit.  Take iron and vitamins as directed by your health care provider.  Eat green leafy vegetables. These are a good source of folic acid. Contact a health care provider if:  You have frequent or lasting headaches.  You are looking pale.  You are bruising easily. Get help right away if:  You have extreme weakness, shortness of breath, or chest pain.  You become dizzy or have trouble concentrating.  You have heavy vaginal bleeding.  You develop a rash.  You have bloody or black, tarry stools.  You faint.  You vomit up blood.  You vomit repeatedly.  You have abdominal pain.  You have a fever or persistent symptoms for more than 2-3 days.  You have a fever and your symptoms suddenly get worse.  You are dehydrated. This information is not intended to replace advice given to you by your health care provider. Make sure you discuss any questions you have with your health care provider. Document Released: 07/27/2000 Document Revised: 01/05/2016 Document Reviewed: 03/11/2013 Elsevier Interactive Patient Education  2017 Elsevier Inc.  

## 2017-07-23 NOTE — MAU Note (Addendum)
Pt said she was diverted to [facility] on Thursday because she saw her membranes bulging out of her vagina. They told her she was not ruptured and closed. She is now complaining of ctx.

## 2017-07-23 NOTE — MAU Provider Note (Signed)
History  CSN: [id number] Arrival date and time: 07/23/17 1355  First Provider Initiated Contact with Patient 07/23/17 1429      Chief Complaint  Patient presents with  . Contractions    HPI: Alexandra Lowery is a 21 y.o. G2P1001 with IUP at 4140w5d who presents to maternity admissions reporting contractions since last night and mucus plug coming out. Patient reports that 5 days ago she had LOF fluid that soaked through her pants, and had something bulging out her vagina. [facility] and reports initially being told that she had membranes bulging out, but on evaluation with speculum they did not see this. She reports being told that her bag of water was intact and that her cervix was closed. Reports that since then she has not had any additional gush of fluid, but noted her mucus plug come out 2 days ago. Came in today because she was having contractions all night, she reports she counted up to 7 contractions in an hour overnight. They have eased off now. Denies any vaginal bleeding. Reports good fetal movement. Denies fever, chills, nausea, vomiting, or any other concerns.  [facility] Pregnancy complicated by insufficient PNC, anemia, and chlamydia infection this pregnancy.   OB History  Gravida Para Term Preterm AB Living  2 1 1     1   SAB TAB Ectopic Multiple Live Births          1    # Outcome Date GA Lbr Len/2nd Weight Sex Delivery Anes PTL Lv  2 Current           1 Term 12/15/12 3517w4d 08:27 / 00:26 6 lb 10.7 oz (3.025 kg) F Vag-Spont EPI  LIV     Past Medical History:  Diagnosis Date  . Chlamydia 01/18/2017  . Gonorrhea 2015 and 2017  . Hx of migraines    Past Surgical History:  Procedure Laterality Date  . WISDOM TOOTH EXTRACTION     Family History  Problem Relation Age of Onset  . Hypertension Maternal Grandmother    Social History   Socioeconomic History  . Marital status: Single    Spouse name: Not on file  . Number of  children: Not on file  . Years of education: Not on file  . Highest education level: Not on file  Social Needs  . Financial resource strain: Not on file  . Food insecurity - worry: Not on file  . Food insecurity - inability: Not on file  . Transportation needs - medical: Not on file  . Transportation needs - non-medical: Not on file  Occupational History  . Not on file  Tobacco Use  . Smoking status: Never Smoker  . Smokeless tobacco: Never Used  Substance and Sexual Activity  . Alcohol use: No  . Drug use: No  . Sexual activity: Yes    Partners: Male    Birth control/protection: None    Comment: Last Depo given 12-15-12  Other Topics Concern  . Not on file  Social History Narrative  . Not on file   No Known Allergies  Medications Prior to Admission  Medication Sig Dispense Refill Last Dose  . acetaminophen (TYLENOL) 500 MG tablet Take 500 mg by mouth every 6 (six) hours as needed for mild pain.   Taking  . Doxylamine-Pyridoxine (DICLEGIS) 10-10 MG TBEC Take 1 tablet by mouth 2 (two) times daily as needed. (Patient not taking: Reported on 06/24/2017) 60 tablet 1 Not Taking  .  ferrous sulfate 325 (65 FE) MG tablet Take 1 tablet (325 mg total) by mouth daily. 30 tablet 0 Taking  . metroNIDAZOLE (FLAGYL) 500 MG tablet Take 1 tablet (500 mg total) 2 (two) times daily by mouth. 14 tablet 0   . polyethylene glycol powder (GLYCOLAX/MIRALAX) powder Take 17 g 2 (two) times daily as needed by mouth for moderate constipation. 500 g 2   . Prenatal Vit-Fe Fumarate-FA (PRENATAL MULTIVITAMIN) TABS tablet Take 1 tablet by mouth daily at 12 noon.   Not Taking  . Prenatal Vit-Fe Phos-FA-Omega (VITAFOL GUMMIES) 3.33-0.333-34.8 MG CHEW Chew 2 tablets daily by mouth. 60 tablet 6   . terconazole (TERAZOL 7) 0.4 % vaginal cream Place 1 applicator at bedtime vaginally. 45 g 1     I have reviewed patient's Past Medical Hx, Surgical Hx, Family Hx, Social Hx, medications and allergies.   Review of  Systems: Negative except for what is mentioned in HPI.  Physical Exam   Blood pressure (!) 115/59, pulse 99, temperature 99.1 F (37.3 C), resp. rate 16, last menstrual period 12/20/2016, SpO2 100 %.  Constitutional: Well-developed, well-nourished female in no acute distress.  HENT: [place]/AT, normal oropharynx mucosa. MMM Eyes: normal conjunctivae, no scleral icterus Cardiovascular: normal rate Respiratory: normal effort GI: Abd soft, non-tender, gravid appropriate for gestational age.   Pelvic: NEFG. Normal vaginal mucosa without lesions. Cervix appears about 1-2 open, but long. No bulging bag, no pooling, no LOF with cough and valsalva. SVE: 2cm/long/-3 MSK: Extremities nontender, no edema Neurologic: Alert and oriented x 4. Psych: Normal mood and affect Skin: warm and dry   FHT:  Baseline 145 , moderate variability, accelerations present, no decelerations Toco: rare ctx  MAU Course/MDM:   Nursing notes and VS reviewed. Patient seen and examined, as noted above.  Speculum exam reassuring. Ferning negative  FFN collected prior to pelvic exam. Will get limited OB for AFI given history of LOF 5 days ago. Will consider Amnisure. Will also attempt to get records from outside hospital.   [facility]. Per records, pt had negative Amnisure, normal speculum exam with no bulging bag visualized. SVE closed/0/-3. MVP 7 cm on bedside ultrasound.  U/S today showed AFI of 13 cm. FFN negative  Discussed normal results with patient. Will d/c home with precautions.   Assessment and Plan  Assessment: 1. Anemia affecting pregnancy, antepartum   2. Preterm contractions   3. [redacted] weeks gestation of pregnancy     Plan: --Discharge home in stable condition.  --PTL and ROM precautions --Start Ferrous sulfate TID for anemia (Hgb 7.3 on outside records from 5 days ago). Advised to start stool softener to avoid constipation.  --Keep prenatal appt in 2 days.     Meshia Rau, Kandra NicolasJulie P, MD 07/23/2017 5:21 PM   Future Appointments  Date Time Provider Department Center  07/25/2017  8:45 AM [facility] None  07/25/2017  9:00 AM Adam PhenixArnold, James G, MD [facility] None

## 2017-07-23 NOTE — MAU Note (Signed)
Provider at bedside to manually retract uterus. Pt tolerated well. Pt left in trendelenburg.

## 2017-07-24 LAB — GC/CHLAMYDIA PROBE AMP (~~LOC~~) NOT AT ARMC
Chlamydia: NEGATIVE
Neisseria Gonorrhea: NEGATIVE

## 2017-07-25 ENCOUNTER — Other Ambulatory Visit: Payer: Medicaid Other

## 2017-07-25 ENCOUNTER — Ambulatory Visit (INDEPENDENT_AMBULATORY_CARE_PROVIDER_SITE_OTHER): Payer: Medicaid Other | Admitting: Obstetrics & Gynecology

## 2017-07-25 ENCOUNTER — Other Ambulatory Visit: Payer: Medicaid Other | Admitting: Obstetrics & Gynecology

## 2017-07-25 VITALS — BP 112/73 | HR 108 | Wt 170.3 lb

## 2017-07-25 DIAGNOSIS — Z3480 Encounter for supervision of other normal pregnancy, unspecified trimester: Secondary | ICD-10-CM

## 2017-07-25 NOTE — Progress Notes (Signed)
   PRENATAL VISIT NOTE  Subjective:  Alexandra Lowery is a 21 y.o. G2P1001 at 1542w0d being seen today for ongoing prenatal care.  She is currently monitored for the following issues for this low-risk pregnancy and has Chlamydia infection affecting pregnancy in first trimester; Encounter for supervision of normal pregnancy, antepartum; Insufficient prenatal care in first trimester; and Anemia affecting pregnancy, antepartum on their problem list. She has been seen in MAU recently and was told she had cx and vaginal prolapse Patient reports bulging at vagina but not today.  Contractions: Irritability. Vag. Bleeding: None.  Movement: Present. Denies leaking of fluid.   The following portions of the patient's history were reviewed and updated as appropriate: allergies, current medications, past family history, past medical history, past social history, past surgical history and problem list. Problem list updated.  Objective:   Vitals:   07/25/17 0910  BP: 112/73  Pulse: (!) 108  Weight: 170 lb 4.8 oz (77.2 kg)    Fetal Status:     Movement: Present     General:  Alert, oriented and cooperative. Patient is in no acute distress.  Skin: Skin is warm and dry. No rash noted.   Cardiovascular: Normal heart rate noted  Respiratory: Normal respiratory effort, no problems with respiration noted  Abdomen: Soft, gravid, appropriate for gestational age.  Pain/Pressure: Absent     Pelvic: Cervical exam performed      Chaperone present, half speculum used, no prolapse of anterior vagina or cervix  Extremities: Normal range of motion.  Edema: None  Mental Status:  Normal mood and affect. Normal behavior. Normal judgment and thought content.   Assessment and Plan:  Pregnancy: G2P1001 at 6542w0d  1. Supervision of other normal pregnancy, antepartum Follow for possible prolapse, no pessary indicated today  Preterm labor symptoms and general obstetric precautions including but not limited to vaginal  bleeding, contractions, leaking of fluid and fetal movement were reviewed in detail with the patient. Please refer to After Visit Summary for other counseling recommendations.  Return in about 2 weeks (around 08/08/2017).   Scheryl DarterJames Traeton Bordas, MD

## 2017-07-25 NOTE — Progress Notes (Signed)
Patient reports having contractions early this morning x 3 that were 5 minutes apart.

## 2017-07-25 NOTE — Patient Instructions (Signed)

## 2017-07-26 LAB — CBC
HEMATOCRIT: 27.4 % — AB (ref 34.0–46.6)
HEMOGLOBIN: 7.7 g/dL — AB (ref 11.1–15.9)
MCH: 16.9 pg — ABNORMAL LOW (ref 26.6–33.0)
MCHC: 28.1 g/dL — ABNORMAL LOW (ref 31.5–35.7)
MCV: 60 fL — ABNORMAL LOW (ref 79–97)
Platelets: 313 10*3/uL (ref 150–379)
RBC: 4.55 x10E6/uL (ref 3.77–5.28)
RDW: 20.6 % — ABNORMAL HIGH (ref 12.3–15.4)
WBC: 9.7 10*3/uL (ref 3.4–10.8)

## 2017-07-26 LAB — RPR: RPR: NONREACTIVE

## 2017-07-26 LAB — GLUCOSE TOLERANCE, 2 HOURS W/ 1HR
GLUCOSE, 2 HOUR: 101 mg/dL (ref 65–152)
Glucose, 1 hour: 105 mg/dL (ref 65–179)
Glucose, Fasting: 78 mg/dL (ref 65–91)

## 2017-07-26 LAB — HIV ANTIBODY (ROUTINE TESTING W REFLEX): HIV Screen 4th Generation wRfx: NONREACTIVE

## 2017-07-30 ENCOUNTER — Encounter: Payer: Self-pay | Admitting: Obstetrics

## 2017-07-31 ENCOUNTER — Telehealth: Payer: Self-pay | Admitting: Pediatrics

## 2017-07-31 ENCOUNTER — Ambulatory Visit (INDEPENDENT_AMBULATORY_CARE_PROVIDER_SITE_OTHER): Payer: Medicaid Other | Admitting: Obstetrics and Gynecology

## 2017-07-31 ENCOUNTER — Encounter: Payer: Self-pay | Admitting: Obstetrics and Gynecology

## 2017-07-31 VITALS — BP 98/63 | HR 94 | Wt 173.0 lb

## 2017-07-31 DIAGNOSIS — Z3483 Encounter for supervision of other normal pregnancy, third trimester: Secondary | ICD-10-CM | POA: Diagnosis not present

## 2017-07-31 DIAGNOSIS — Z3480 Encounter for supervision of other normal pregnancy, unspecified trimester: Secondary | ICD-10-CM

## 2017-07-31 NOTE — Progress Notes (Signed)
   PRENATAL VISIT NOTE  Subjective:  Alexandra Lowery is a 21 y.o. G2P1001 at 5337w6d being seen today for ongoing prenatal care.  She is currently monitored for the following issues for this low-risk pregnancy and has Chlamydia infection affecting pregnancy in first trimester; Encounter for supervision of normal pregnancy, antepartum; Insufficient prenatal care in first trimester; and Anemia affecting pregnancy, antepartum on their problem list.  Patient reports pelvic pressure.  Contractions: Irregular. Vag. Bleeding: None.  Movement: Present. Denies leaking of fluid.   The following portions of the patient's history were reviewed and updated as appropriate: allergies, current medications, past family history, past medical history, past social history, past surgical history and problem list. Problem list updated.  Objective:   Vitals:   07/31/17 1437  BP: 98/63  Pulse: 94  Weight: 173 lb (78.5 kg)    Fetal Status: Fetal Heart Rate (bpm): 156 Fundal Height: 32 cm Movement: Present  Presentation: Vertex  General:  Alert, oriented and cooperative. Patient is in no acute distress.  Skin: Skin is warm and dry. No rash noted.   Cardiovascular: Normal heart rate noted  Respiratory: Normal respiratory effort, no problems with respiration noted  Abdomen: Soft, gravid, appropriate for gestational age.  Pain/Pressure: Present     Pelvic: Cervical exam performed Dilation: 1.5 Effacement (%): Thick Station: -3  Extremities: Normal range of motion.  Edema: None  Mental Status:  Normal mood and affect. Normal behavior. Normal judgment and thought content.   Assessment and Plan:  Pregnancy: G2P1001 at 7737w6d  1. Supervision of other normal pregnancy, antepartum Patient reports pelvic pressure intermittently Patient with audible fetal heart rate abnormality initially but resolved by the end of the visit. Reassurance was provided but patient still worried about it. Will monitor closely at every  visit Cervical exam unchanged.  Preterm labor precautions reviewed Patient with history of cervical prolapse. Based on today's exam prolapse is not appreciated. Cervix is posterior. Patient is not interested in pessary and is no longer a candidate for it.  Preterm labor symptoms and general obstetric precautions including but not limited to vaginal bleeding, contractions, leaking of fluid and fetal movement were reviewed in detail with the patient. Please refer to After Visit Summary for other counseling recommendations.  Return for as scheduled next week.   Catalina AntiguaPeggy Ranata Laughery, MD

## 2017-07-31 NOTE — Telephone Encounter (Signed)
I called pt in regards to MyChart message. She reports "bad" low back pain.  She reports ctx, intermittent.  She states she was dilated 2 cm at last appt.  She states she is scared and wants to be checked again and back pain is worsening.  She denies vaginal bleeding, pressure, and reports +FM.  I sch appt at 1415 today for pain control and reassurance. She voiced understanding and agreed with plan.

## 2017-08-06 ENCOUNTER — Encounter (HOSPITAL_COMMUNITY): Payer: Self-pay

## 2017-08-06 ENCOUNTER — Inpatient Hospital Stay (HOSPITAL_COMMUNITY)
Admission: AD | Admit: 2017-08-06 | Discharge: 2017-08-07 | Disposition: A | Discharge status: Home / Self Care | Payer: Medicaid Other | Source: Ambulatory Visit | Attending: Family Medicine | Admitting: Family Medicine

## 2017-08-06 ENCOUNTER — Other Ambulatory Visit: Payer: Self-pay

## 2017-08-06 DIAGNOSIS — R8271 Bacteriuria: Secondary | ICD-10-CM

## 2017-08-06 DIAGNOSIS — B379 Candidiasis, unspecified: Secondary | ICD-10-CM | POA: Diagnosis not present

## 2017-08-06 DIAGNOSIS — O99019 Anemia complicating pregnancy, unspecified trimester: Secondary | ICD-10-CM

## 2017-08-06 DIAGNOSIS — O0931 Supervision of pregnancy with insufficient antenatal care, first trimester: Secondary | ICD-10-CM

## 2017-08-06 DIAGNOSIS — Z3A32 32 weeks gestation of pregnancy: Secondary | ICD-10-CM | POA: Diagnosis not present

## 2017-08-06 DIAGNOSIS — O98811 Other maternal infectious and parasitic diseases complicating pregnancy, first trimester: Secondary | ICD-10-CM

## 2017-08-06 DIAGNOSIS — O4703 False labor before 37 completed weeks of gestation, third trimester: Secondary | ICD-10-CM | POA: Diagnosis present

## 2017-08-06 DIAGNOSIS — O98813 Other maternal infectious and parasitic diseases complicating pregnancy, third trimester: Secondary | ICD-10-CM | POA: Diagnosis not present

## 2017-08-06 DIAGNOSIS — A749 Chlamydial infection, unspecified: Secondary | ICD-10-CM

## 2017-08-06 LAB — URINALYSIS, ROUTINE W REFLEX MICROSCOPIC
Bilirubin Urine: NEGATIVE
Glucose, UA: NEGATIVE mg/dL
HGB URINE DIPSTICK: NEGATIVE
Ketones, ur: NEGATIVE mg/dL
NITRITE: NEGATIVE
PH: 6 (ref 5.0–8.0)
Protein, ur: NEGATIVE mg/dL
Specific Gravity, Urine: 1.016 (ref 1.005–1.030)

## 2017-08-06 LAB — WET PREP, GENITAL
Clue Cells Wet Prep HPF POC: NONE SEEN
SPERM: NONE SEEN
Trich, Wet Prep: NONE SEEN

## 2017-08-06 MED ORDER — NITROFURANTOIN MONOHYD MACRO 100 MG PO CAPS: 100.0000 mg | ORAL_CAPSULE | Freq: Two times a day (BID) | ORAL | 0 refills | Status: DC

## 2017-08-06 MED ORDER — TERCONAZOLE 0.4 % VA CREA: 1.0000 | TOPICAL_CREAM | Freq: Every day | VAGINAL | 0 refills | Status: DC

## 2017-08-06 NOTE — MAU Provider Note (Signed)
History   [id number]   Chief Complaint  Patient presents with  . Contractions    HPI Alexandra Lowery is a 21 y.o. female  G2P1001 at 9460w5d IUP here with report of having contractions that started at 1800.  Reports feeling contractions every 5 minutes that lasted approximately an hour.  No longer feels contractions since arrival to MAU.  Also reports constipation with last bowel movement 3 days ago.  Currently taking Miralax.  Denies vaginal bleeding or leaking of fluid.  Lost mucus plug 1 week ago.  +mucus-like discharge.  Last intercourse in September.  +fetal movement.  Patient's last menstrual period was 12/20/2016 (exact date).  OB History  Gravida Para Term Preterm AB Living  2 1 1     1   SAB TAB Ectopic Multiple Live Births          1    # Outcome Date GA Lbr Len/2nd Weight Sex Delivery Anes PTL Lv  2 Current           1 Term 12/15/12 6523w4d 08:27 / 00:26 6 lb 10.7 oz (3.025 kg) F Vag-Spont EPI  LIV      Past Medical History:  Diagnosis Date  . Chlamydia 01/18/2017  . Gonorrhea 2015 and 2017  . Hx of migraines     Family History  Problem Relation Age of Onset  . Hypertension Maternal Grandmother     Social History   Socioeconomic History  . Marital status: Single    Spouse name: None  . Number of children: None  . Years of education: None  . Highest education level: None  Social Needs  . Financial resource strain: None  . Food insecurity - worry: None  . Food insecurity - inability: None  . Transportation needs - medical: None  . Transportation needs - non-medical: None  Occupational History  . None  Tobacco Use  . Smoking status: Never Smoker  . Smokeless tobacco: Never Used  Substance and Sexual Activity  . Alcohol use: No  . Drug use: No  . Sexual activity: Not Currently    Partners: Male    Birth control/protection: None  Other Topics Concern  . None  Social History Narrative  . None    No Known Allergies  No current  facility-administered medications on file prior to encounter.    Current Outpatient Medications on File Prior to Encounter  Medication Sig Dispense Refill  . acetaminophen (TYLENOL) 500 MG tablet Take 500 mg by mouth every 6 (six) hours as needed for mild pain.    . Doxylamine-Pyridoxine (DICLEGIS) 10-10 MG TBEC Take 1 tablet by mouth 2 (two) times daily as needed. 60 tablet 1  . ferrous sulfate 325 (65 FE) MG tablet Take 1 tablet (325 mg total) by mouth 3 (three) times daily with meals. 90 tablet 1  . polyethylene glycol powder (GLYCOLAX/MIRALAX) powder Take 17 g 2 (two) times daily as needed by mouth for moderate constipation. 500 g 2  . Prenatal Vit-Fe Fumarate-FA (PRENATAL MULTIVITAMIN) TABS tablet Take 1 tablet by mouth daily at 12 noon.       Review of Systems  Constitutional: Negative for fever.  Gastrointestinal: Positive for constipation. Negative for nausea and vomiting.  Genitourinary: Positive for pelvic pain (contractions). Negative for dysuria, vaginal bleeding and vaginal discharge.  Musculoskeletal: Positive for back pain (lower midback).  Neurological: Negative for headaches.     Physical Exam   Vitals:   08/06/17 2216  BP: (!) 108/58  Pulse: (!) 103  Resp: 16  Temp: 98.7 F (37.1 C)  TempSrc: Oral  SpO2: 100%  Weight: 173 lb (78.5 kg)  Height: 5\' 9"  (1.753 m)    Physical Exam  Constitutional: She is oriented to person, place, and time. She appears well-developed and well-nourished.  HENT:  Head: Normocephalic.  Neck: Normal range of motion. Neck supple.  Cardiovascular: Normal rate, regular rhythm and normal heart sounds.  Respiratory: Effort normal and breath sounds normal. No respiratory distress.  GI: Soft. There is no tenderness.  Genitourinary: No bleeding in the vagina. Vaginal discharge (mucusy) found.  Musculoskeletal: Normal range of motion. She exhibits no edema.  Neurological: She is alert and oriented to person, place, and time.  Skin: Skin  is warm and dry.   Dilation: 1.5 Effacement (%): 50 Cervical Position: Posterior Station: -3 Presentation: Vertex Exam by:: Margarita MailW. Karim, CNM (no change since last exam)  FHR 135, +accels, no decels, moderate variability Toco - none MAU Course  Procedures  MDM Results for orders placed or performed during the hospital encounter of 08/06/17 (from the past 24 hour(s))  Urinalysis, Routine w reflex microscopic     Status: Abnormal   Collection Time: 08/06/17  9:55 PM  Result Value Ref Range   Color, Urine YELLOW YELLOW   APPearance CLOUDY (A) CLEAR   Specific Gravity, Urine 1.016 1.005 - 1.030   pH 6.0 5.0 - 8.0   Glucose, UA NEGATIVE NEGATIVE mg/dL   Hgb urine dipstick NEGATIVE NEGATIVE   Bilirubin Urine NEGATIVE NEGATIVE   Ketones, ur NEGATIVE NEGATIVE mg/dL   Protein, ur NEGATIVE NEGATIVE mg/dL   Nitrite NEGATIVE NEGATIVE   Leukocytes, UA LARGE (A) NEGATIVE   RBC / HPF 6-30 0 - 5 RBC/hpf   WBC, UA TOO NUMEROUS TO COUNT 0 - 5 WBC/hpf   Bacteria, UA RARE (A) NONE SEEN   Squamous Epithelial / LPF 6-30 (A) NONE SEEN   Mucus PRESENT   Wet prep, genital     Status: Abnormal   Collection Time: 08/06/17 11:00 PM  Result Value Ref Range   Yeast Wet Prep HPF POC PRESENT (A) NONE SEEN   Trich, Wet Prep NONE SEEN NONE SEEN   Clue Cells Wet Prep HPF POC NONE SEEN NONE SEEN   WBC, Wet Prep HPF POC MANY (A) NONE SEEN   Sperm NONE SEEN      Assessment and Plan  21 y.o. G2P1001 at 7476w5d IUP  Bacteria in Urine Reactive NST Yeast Infection  Plan: DC home RX Terazol Urine culture RX Macrobid 100 mg BID x 7 days  Marlis EdelsonKarim, Walidah N, CNM 08/06/2017 11:37 PM   pratt

## 2017-08-06 NOTE — MAU Note (Signed)
Pt states that she started having ctx's at 1800. She states that they were very painful, but while sitting in the waiting room the ctx's stopped. She states she is now only having pain in her back 6/10 constant.   Denies vaginal bleeding and LOF.

## 2017-08-08 LAB — URINE CULTURE

## 2017-08-29 ENCOUNTER — Telehealth: Payer: Self-pay

## 2017-08-29 NOTE — Telephone Encounter (Signed)
Called pt left message to get patient scheduled for ob visit. Pt last seen on 07/31/17.

## 2017-09-09 ENCOUNTER — Telehealth: Payer: Self-pay | Admitting: Pediatrics

## 2017-09-09 NOTE — Telephone Encounter (Signed)
Pt called in reporting she is uncomfortable, contracting, dilated, and the hospital keeps sending her home.  Pt states she was contracting and dilated to 2cm.  I advised pt unless she is in active labor she will be sent home unless exam reveals indication to keep her.  She states she wants IOL. I advised her they will not induce her at this point without medical indication to do so.  I advised her this point in pregnancy is uncomfortable and she also needs ROB, she has not been seen since 07/31/17.   At this point the patient hung up on me.  I attempted to call the number back and call was sent to voicemail, voicemail box was full.

## 2017-09-16 ENCOUNTER — Telehealth: Payer: Self-pay

## 2017-09-16 NOTE — Telephone Encounter (Signed)
TC to pt regarding message. Pt c/o being uncomfortable and pain. Pt notes + FM  No consent contractions today. Pt last seen 07/31/17 for an OB appt. Pt was seen at the hospital in [place]. Was sent home . Consulted w/ scheduling to get pt scheduled in the office for her OB visit.  Pt voiced understanding.

## 2017-09-18 ENCOUNTER — Encounter: Payer: Medicaid Other | Admitting: Certified Nurse Midwife

## 2017-09-18 DIAGNOSIS — Z3A38 38 weeks gestation of pregnancy: Secondary | ICD-10-CM | POA: Insufficient documentation

## 2017-12-19 ENCOUNTER — Encounter (HOSPITAL_COMMUNITY): Payer: Self-pay

## 2018-07-26 IMAGING — US US MFM OB COMP +14 WKS
1 series · 14 of 28 positions shown · non-contrast
Comparison: none

[Series 1: us mfm ob comp +14 wks · 89 acquisitions, 14 frames shown]
[im 4/89]
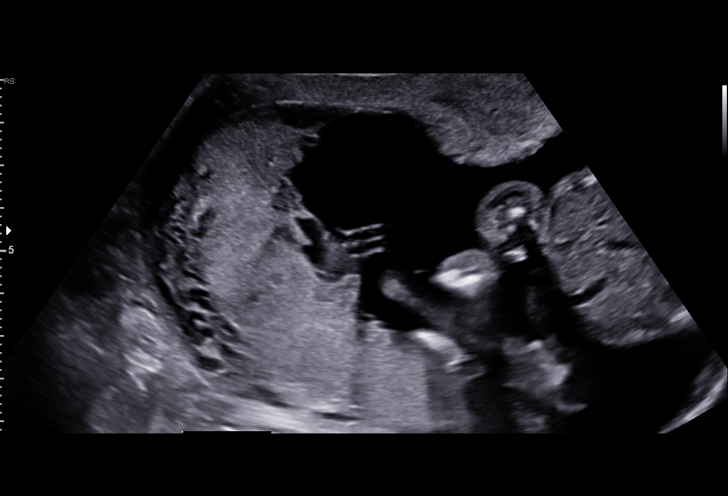
[im 10/89]
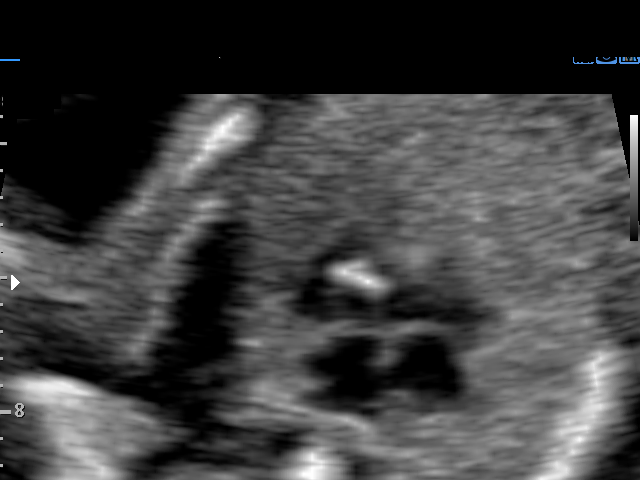
[im 17/89]
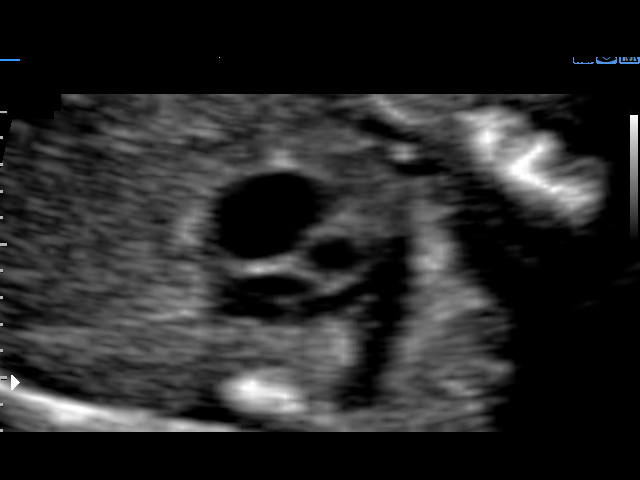
[im 23/89]
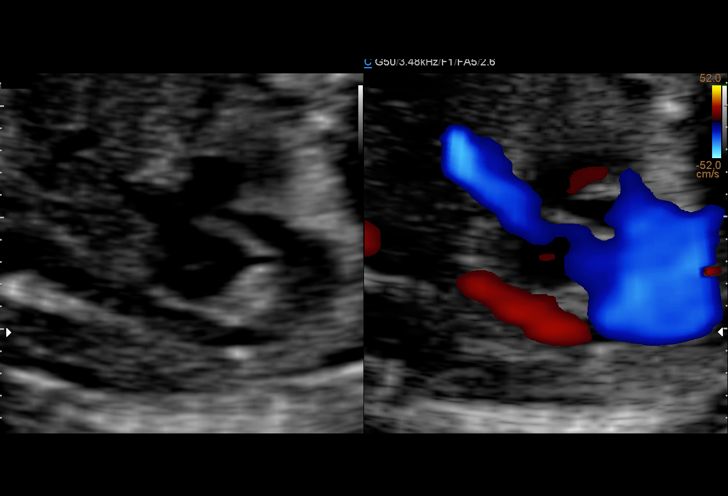
[im 30/89]
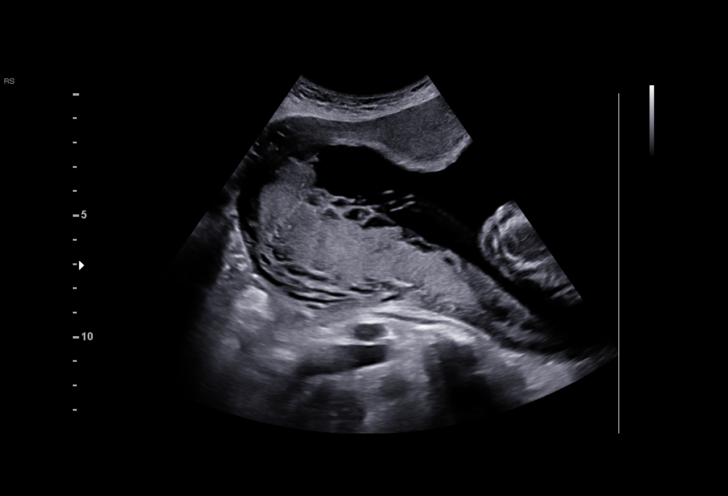
[im 36/89]
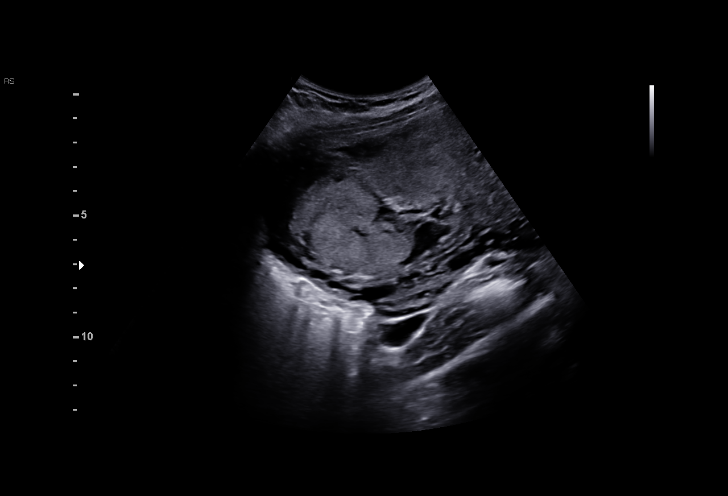
[im 43/89]
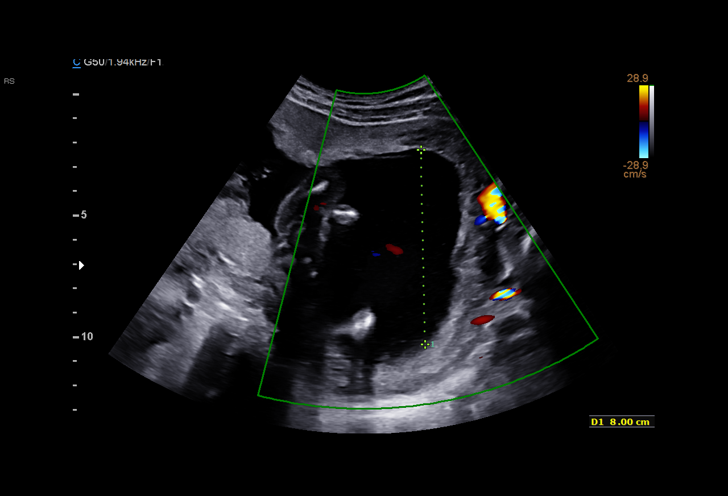
[im 49/89]
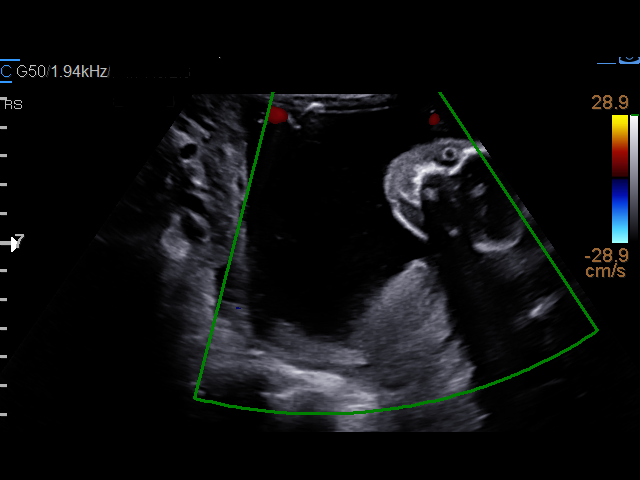
[im 56/89]
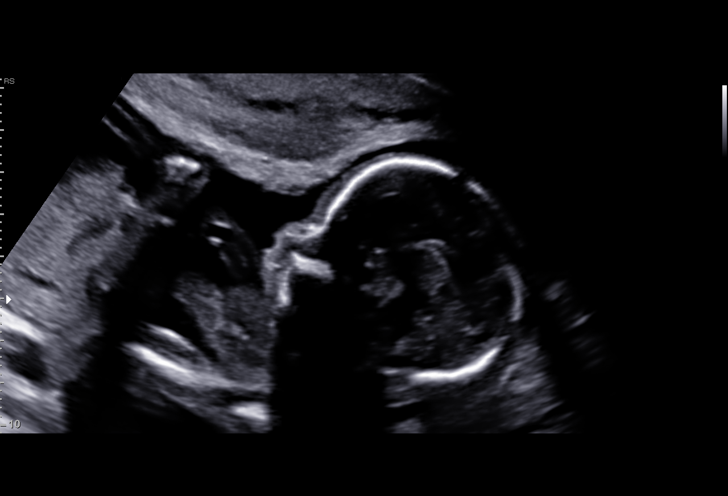
[im 62/89]
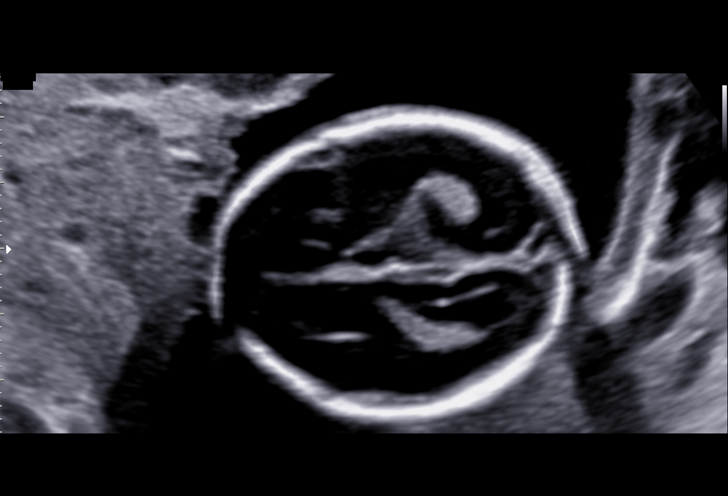
[im 69/89]
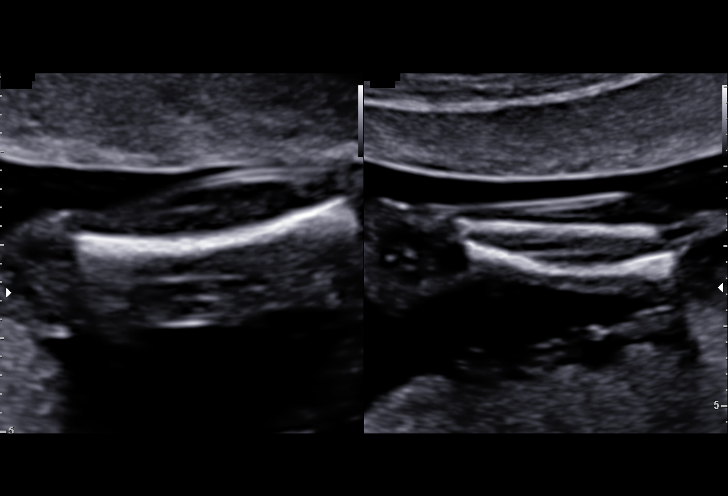
[im 75/89]
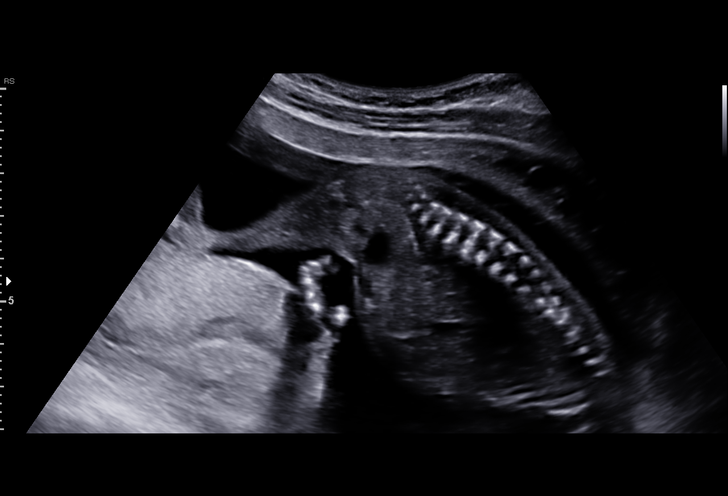
[im 82/89]
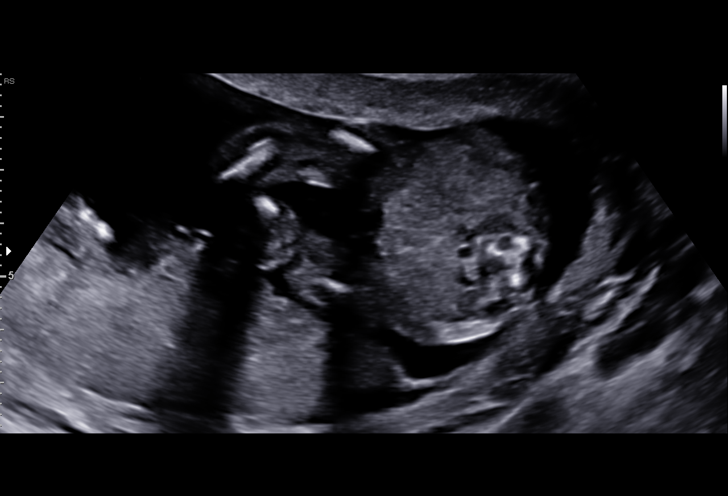
[im 89/89]
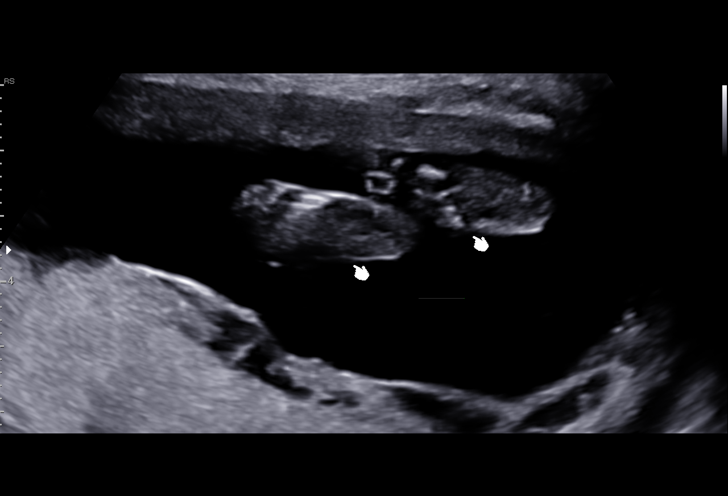

[14 of 28 positions shown; findings below may reference images not displayed]

OB/Gyn Clinic

1  WALDINA GREINER             [id number]      [id number]     [id number]
Indications

18 weeks gestation of pregnancy
Antenatal screening for malformations
OB History

Gravidity:    2         Term:   1
Living:       1
Fetal Evaluation

Num Of Fetuses:     1
Fetal Heart         136
Rate(bpm):
Cardiac Activity:   Observed
Presentation:       Variable
Placenta:           Posterior Fundal, above cervical os
P. Cord Insertion:  Visualized, central

Amniotic Fluid
AFI FV:      Subjectively within normal limits

Largest Pocket(cm)
8.00
Biometry

BPD:      44.7  mm     G. Age:  19w 4d         78  %    CI:        71.52   %   70 - 86
FL/HC:      17.9   %   16.1 -
HC:      168.3  mm     G. Age:  19w 4d         72  %    HC/AC:      1.14       1.09 -
AC:      147.5  mm     G. Age:  20w 0d         81  %    FL/BPD:     67.3   %
FL:       30.1  mm     G. Age:  19w 2d         59  %    FL/AC:      20.4   %   20 - 24
HUM:      29.5  mm     G. Age:  19w 5d         72  %
CER:      20.9  mm     G. Age:  19w 6d         75  %
NFT:       6.4  mm
CM:        3.2  mm

Est. FW:     306  gm    0 lb 11 oz      58  %
Gestational Age

LMP:           18w 6d       Date:   12/20/16                 EDD:   09/26/17
U/S Today:     19w 4d                                        EDD:   09/21/17
Best:          18w 6d    Det. By:   LMP  (12/20/16)          EDD:   09/26/17
Anatomy

Cranium:               Appears normal         Aortic Arch:            Appears normal
Cavum:                 Appears normal         Ductal Arch:            Appears normal
Ventricles:            Appears normal         Diaphragm:              Appears normal
Choroid Plexus:        Not well visualized    Stomach:                Appears normal, left
sided
Cerebellum:            Appears normal         Abdomen:                Appears normal
Posterior Fossa:       Appears normal         Abdominal Wall:         Appears nml (cord
insert, abd wall)
Nuchal Fold:           Appears normal         Cord Vessels:           Appears normal (3
vessel cord)
Face:                  Orbits nl; profile not Kidneys:                Appear normal
well visualized
Lips:                  Appears normal         Bladder:                Appears normal
Thoracic:              Appears normal         Spine:                  Not well visualized
Heart:                 Echogenic focus        Upper Extremities:      Appears normal
in LV
RVOT:                  Appears normal         Lower Extremities:      Appears normal
LVOT:                  Appears normal
Cervix Uterus Adnexa

Cervix
Length:           3.54  cm.
Normal appearance by transabdominal scan.

Uterus
No abnormality visualized.

Left Ovary
Not visualized.

Right Ovary
Not visualized.

Adnexa:       No abnormality visualized. No adnexal mass
visualized.
Impression

Singleton intrauterine pregnancy at 18+6 weeks, here for
anatomic survey
Review of the anatomy shows no sonographic markers for
aneuploidy or structural anomalies
However, intracranial and spine evaluations should be
considered suboptimal secondary to fetal position
There is a small nonpathologic-appearing echogenic
intracardiac focus in the left ventricle
Amniotic fluid volume is normal
Estimated fetal weight is 306g which is growth in the 58th
percentile
Recommendations

Recommend repeat scan in 4 weeks to reassess fetal
anatomy and complete anatomic survey

## 2018-08-07 ENCOUNTER — Emergency Department (HOSPITAL_COMMUNITY)
Admission: EM | Admit: 2018-08-07 | Discharge: 2018-08-08 | Disposition: A | Discharge status: Home / Self Care | Payer: Medicaid Other | Attending: Emergency Medicine | Admitting: Emergency Medicine

## 2018-08-07 ENCOUNTER — Emergency Department (HOSPITAL_COMMUNITY): Payer: Medicaid Other

## 2018-08-07 ENCOUNTER — Other Ambulatory Visit: Payer: Self-pay

## 2018-08-07 ENCOUNTER — Encounter (HOSPITAL_COMMUNITY): Payer: Self-pay

## 2018-08-07 DIAGNOSIS — Y929 Unspecified place or not applicable: Secondary | ICD-10-CM | POA: Insufficient documentation

## 2018-08-07 DIAGNOSIS — S5001XA Contusion of right elbow, initial encounter: Secondary | ICD-10-CM | POA: Insufficient documentation

## 2018-08-07 DIAGNOSIS — W208XXA Other cause of strike by thrown, projected or falling object, initial encounter: Secondary | ICD-10-CM | POA: Insufficient documentation

## 2018-08-07 DIAGNOSIS — Y939 Activity, unspecified: Secondary | ICD-10-CM | POA: Insufficient documentation

## 2018-08-07 DIAGNOSIS — Y999 Unspecified external cause status: Secondary | ICD-10-CM | POA: Insufficient documentation

## 2018-08-08 MED ORDER — IBUPROFEN 600 MG PO TABS: 600.0000 mg | ORAL_TABLET | Freq: Three times a day (TID) | ORAL | 0 refills | Status: DC | PRN

## 2018-08-08 NOTE — ED Provider Notes (Signed)
[facility] EMERGENCY [facility] Provider Note   CSN: [id number] Arrival date & time: 08/07/18  2254     History   Chief Complaint Chief Complaint  Patient presents with  . Arm Injury    HPI Alexandra Lowery is a 22 y.o. female.  HPI   22 year old right-hand-dominant female here with right arm pain after getting her arm hit by a car door.  She states that she was [place] on a decline, and that the car door fell and hit her right arm.  She reports that it was briefly stuck but she is able to open the door.  She reports immediate onset of associated aching, throbbing, right elbow pain.  She had some tingling in the area, that is now resolved.  No hand numbness or weakness.  Pain is worse with palpation and movement.  No alleviating factors.  No previous injuries to the area.  Past Medical History:  Diagnosis Date  . Chlamydia 01/18/2017  . Gonorrhea 2015 and 2017  . Hx of migraines     Patient Active Problem List   Diagnosis Date Noted  . Insufficient prenatal care in first trimester 04/21/2017  . Anemia affecting pregnancy, antepartum 04/21/2017  . Encounter for supervision of normal pregnancy, antepartum 04/10/2017  . Chlamydia infection affecting pregnancy in first trimester 01/22/2017    Past Surgical History:  Procedure Laterality Date  . WISDOM TOOTH EXTRACTION       OB History    Gravida  2   Para  1   Term  1   Preterm      AB      Living  1     SAB      TAB      Ectopic      Multiple      Live Births  1            Home Medications    Prior to Admission medications   Medication Sig Start Date End Date Taking? Authorizing Provider  acetaminophen (TYLENOL) 500 MG tablet Take 500 mg by mouth every 6 (six) hours as needed for mild pain.    [provider]  ferrous sulfate 325 (65 FE) MG tablet Take 1 tablet (325 mg total) by mouth 3 (three) times daily with meals. 07/23/17   Degele, Kandra NicolasJulie P, MD  ibuprofen  (ADVIL,MOTRIN) 600 MG tablet Take 1 tablet (600 mg total) by mouth every 8 (eight) hours as needed for moderate pain. 08/08/18   Shaune PollackIsaacs, [place], MD  nitrofurantoin, macrocrystal-monohydrate, (MACROBID) 100 MG capsule Take 1 capsule (100 mg total) by mouth 2 (two) times daily. 08/06/17   Karim-Rhoades, Kae HellerWalidah N, CNM  polyethylene glycol powder (GLYCOLAX/MIRALAX) powder Take 17 g 2 (two) times daily as needed by mouth for moderate constipation. 06/24/17   Constant, Peggy, MD  Prenatal Vit-Fe Fumarate-FA (PRENATAL MULTIVITAMIN) TABS tablet Take 1 tablet by mouth daily at 12 noon.    [provider]  terconazole (TERAZOL 7) 0.4 % vaginal cream Place 1 applicator vaginally at bedtime. 08/06/17   Amedeo GoryKarim-Rhoades, Walidah N, CNM    Family History Family History  Problem Relation Age of Onset  . Hypertension Maternal Grandmother     Social History Social History   Tobacco Use  . Smoking status: Never Smoker  . Smokeless tobacco: Never Used  Substance Use Topics  . Alcohol use: No  . Drug use: No     Allergies   Patient has no known allergies.   Review of  Systems Review of Systems  Constitutional: Negative for chills, fatigue and fever.  HENT: Negative for congestion and rhinorrhea.   Eyes: Negative for visual disturbance.  Respiratory: Negative for cough, shortness of breath and wheezing.   Cardiovascular: Negative for chest pain and leg swelling.  Gastrointestinal: Negative for abdominal pain, diarrhea, nausea and vomiting.  Genitourinary: Negative for dysuria and flank pain.  Musculoskeletal: Positive for arthralgias and myalgias. Negative for neck pain and neck stiffness.  Skin: Negative for rash and wound.  Allergic/Immunologic: Negative for immunocompromised state.  Neurological: Negative for syncope, weakness and headaches.  All other systems reviewed and are negative.    Physical Exam Updated Vital Signs BP 117/70 (BP Location: Right Arm)   Pulse 97   Temp  98.5 F (36.9 C) (Oral)   Resp 16   SpO2 100%   Physical Exam Vitals signs and nursing note reviewed.  Constitutional:      General: She is not in acute distress.    Appearance: She is well-developed.  HENT:     Head: Normocephalic and atraumatic.  Eyes:     Conjunctiva/sclera: Conjunctivae normal.  Neck:     Musculoskeletal: Neck supple.  Cardiovascular:     Rate and Rhythm: Normal rate and regular rhythm.     Heart sounds: Normal heart sounds.  Pulmonary:     Effort: Pulmonary effort is normal. No respiratory distress.     Breath sounds: No wheezing.  Abdominal:     General: There is no distension.  Skin:    General: Skin is warm.     Capillary Refill: Capillary refill takes less than 2 seconds.     Findings: No rash.  Neurological:     Mental Status: She is alert and oriented to person, place, and time.     Motor: No abnormal muscle tone.     UPPER EXTREMITY EXAM: RIGHT  INSPECTION & PALPATION: Mild TTP over right lateral elbow. No deformity. No open wounds.  SENSORY: Sensation is intact to light touch in:  Superficial radial nerve distribution (dorsal first web space) Median nerve distribution (tip of index finger)   Ulnar nerve distribution (tip of small finger)     MOTOR:  + Motor posterior interosseous nerve (thumb IP extension) + Anterior interosseous nerve (thumb IP flexion, index finger DIP flexion) + Radial nerve (wrist extension) + Median nerve (palpable firing thenar mass) + Ulnar nerve (palpable firing of first dorsal interosseous muscle)  VASCULAR: 2+ radial pulse Brisk capillary refill < 2 sec, fingers warm and well-perfused   ED Treatments / Results  Labs (all labs ordered are listed, but only abnormal results are displayed) Labs Reviewed - No data to display  EKG None  Radiology Dg Elbow Complete Right  Result Date: 08/08/2018 CLINICAL DATA:  Shut right elbow in car door, with posterior right elbow pain. Initial encounter. EXAM:  RIGHT ELBOW - COMPLETE 3+ VIEW COMPARISON:  None. FINDINGS: There is no evidence of fracture or dislocation. The visualized joint spaces are preserved. No significant joint effusion is identified. The soft tissues are unremarkable in appearance. IMPRESSION: No evidence of fracture or dislocation. Electronically Signed   By: Roanna RaiderJeffery  Chang M.D.   On: 08/08/2018 00:16    Procedures Procedures (including critical care time)  Medications Ordered in ED Medications - No data to display   Initial Impression / Assessment and Plan / ED Course  I have reviewed the triage vital signs and the nursing notes.  Pertinent labs & imaging results that were available during  my care of the patient were reviewed by me and considered in my medical decision making (see chart for details).     22 yo RHD female here with mild R elbow pain s/p blunt trauma. No open wounds. No distal NV compromise. No signs of compartment syndrome. Suspect mild contusion - RICE, ACE wrap, NSAIDs.  Final Clinical Impressions(s) / ED Diagnoses   Final diagnoses:  Contusion of right elbow, initial encounter    ED Discharge Orders         Ordered    ibuprofen (ADVIL,MOTRIN) 600 MG tablet  Every 8 hours PRN     08/08/18 0315           Shaune Pollack, MD 08/08/18 [phone]

## 2018-08-08 NOTE — ED Triage Notes (Signed)
Pt here after getting right arm stuck in car door. No deformities.  Pulses equal and intact.  Full ROM. A&Ox4.

## 2018-10-17 IMAGING — US US MFM OB LIMITED
1 series · 13 of 21 positions shown · non-contrast
Comparison: none

[Series 1: us mfm ob limited · 21 acquisitions, 13 frames shown]
[im 1/21]
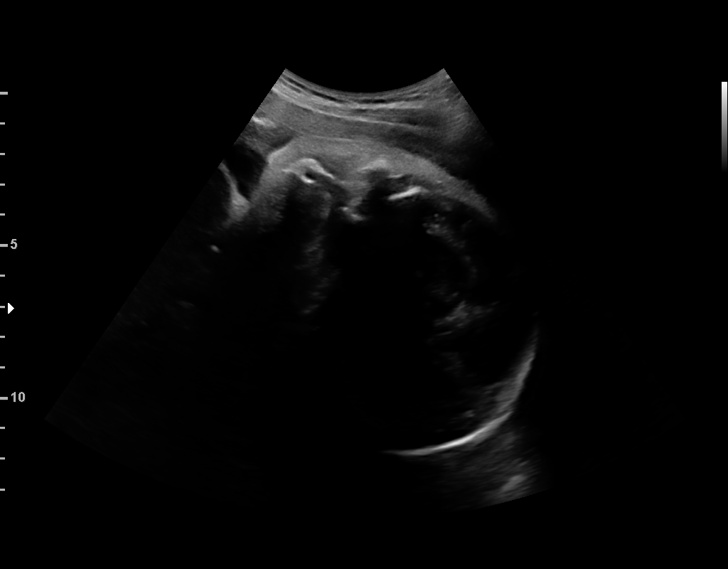
[im 3/21]
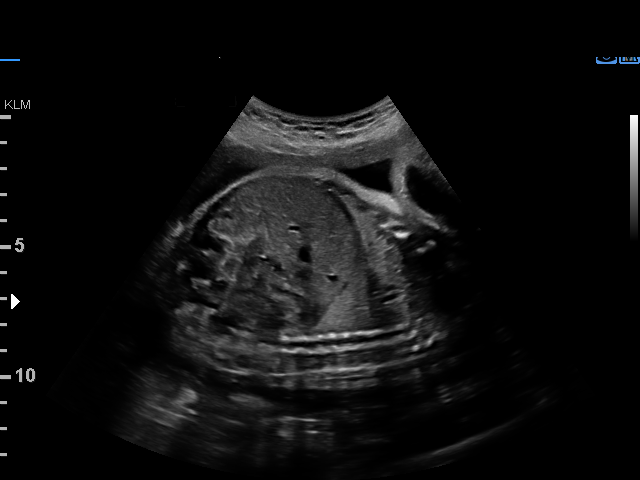
[im 5/21]
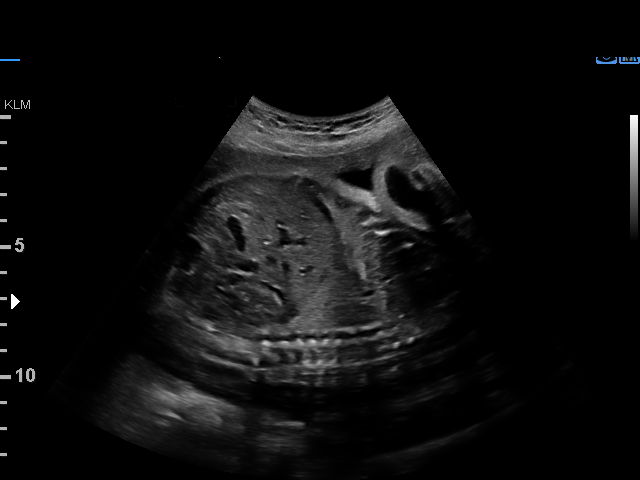
[im 6/21]
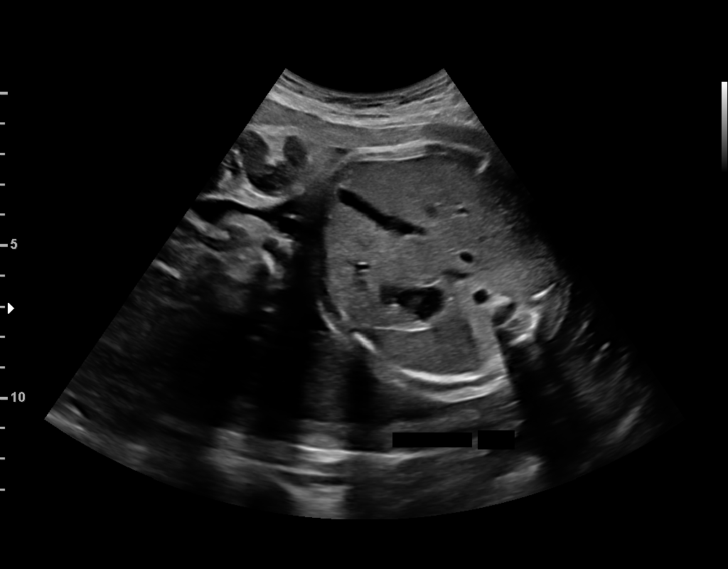
[im 8/21]
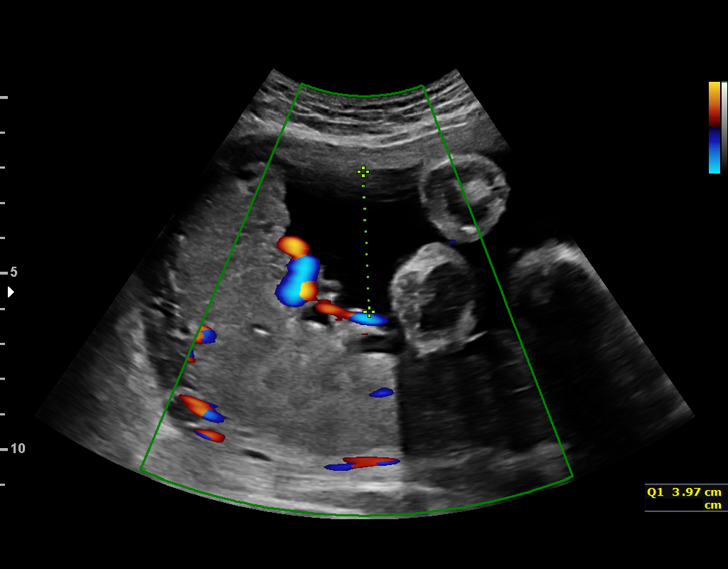
[im 9/21]
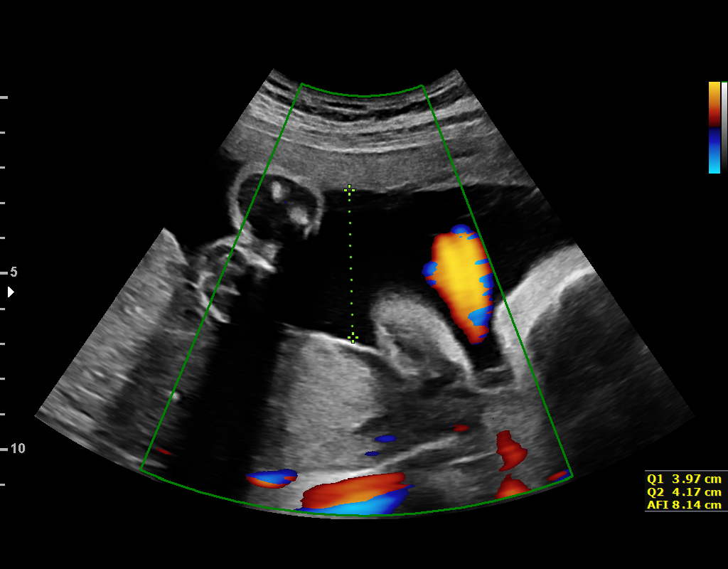
[im 11/21]
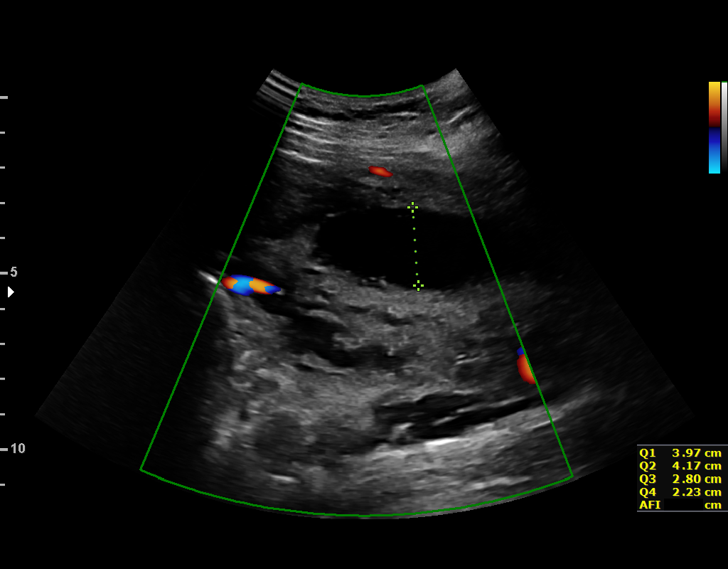
[im 13/21]
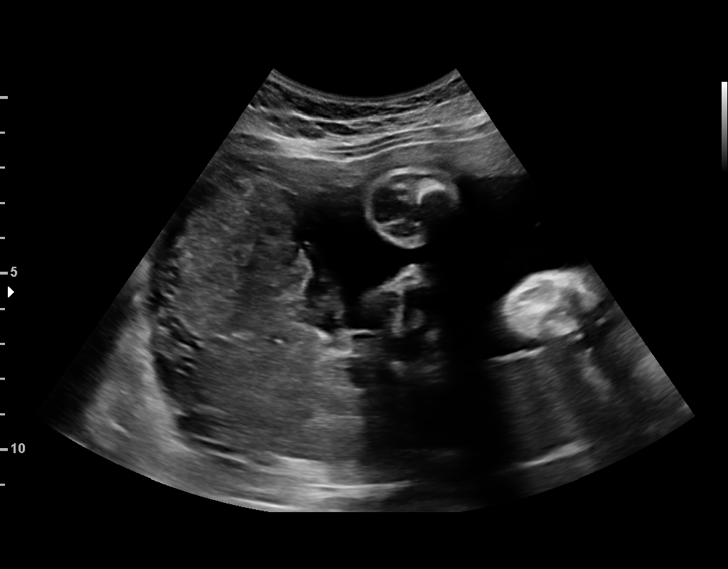
[im 14/21]
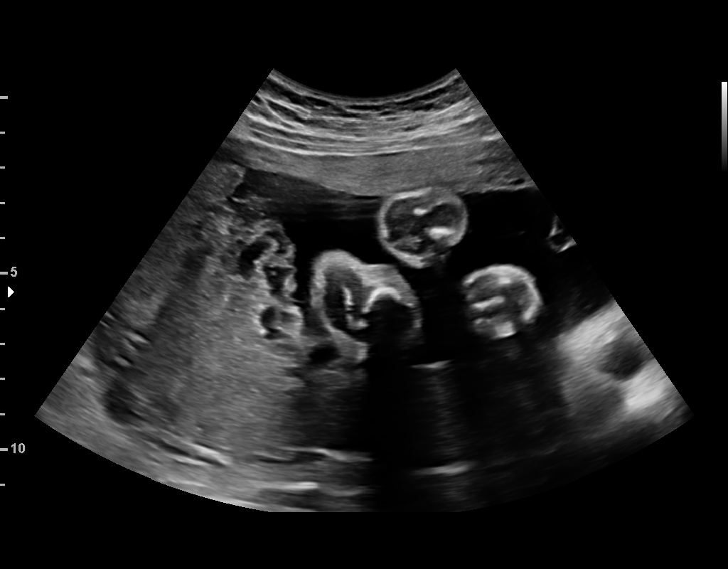
[im 16/21]
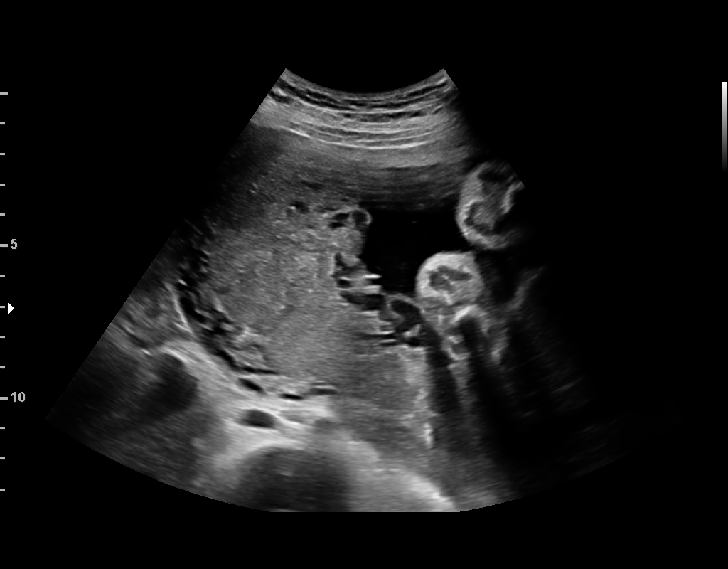
[im 17/21]
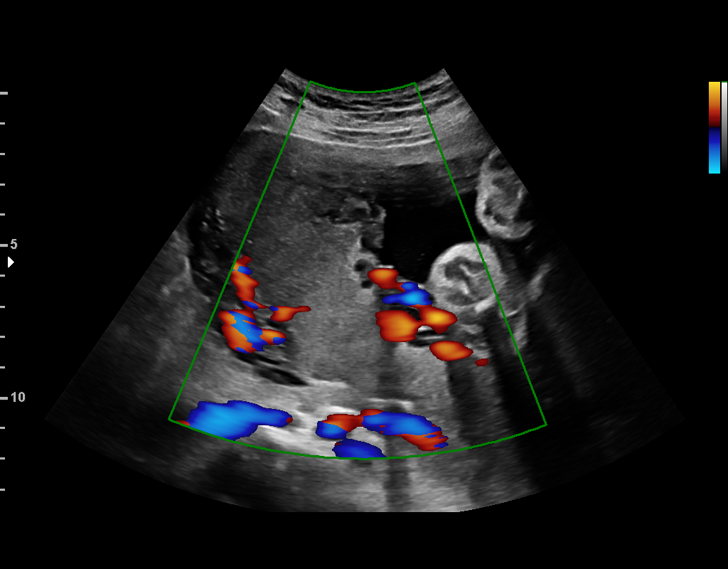
[im 19/21]
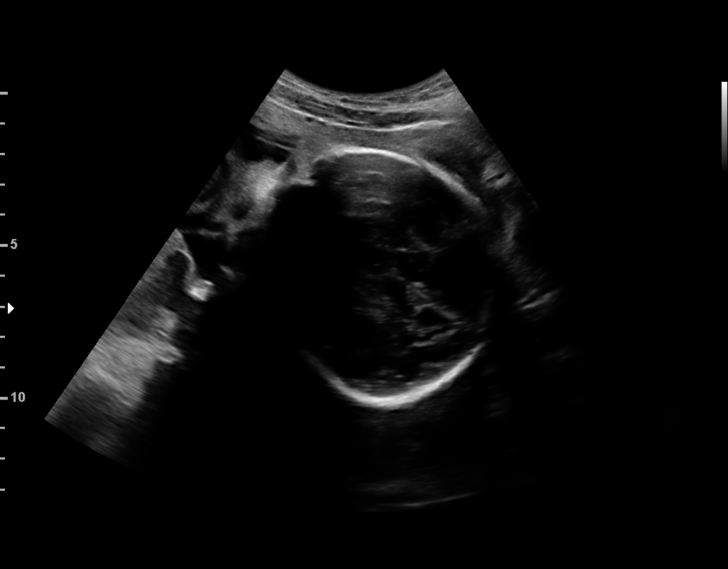
[im 21/21]
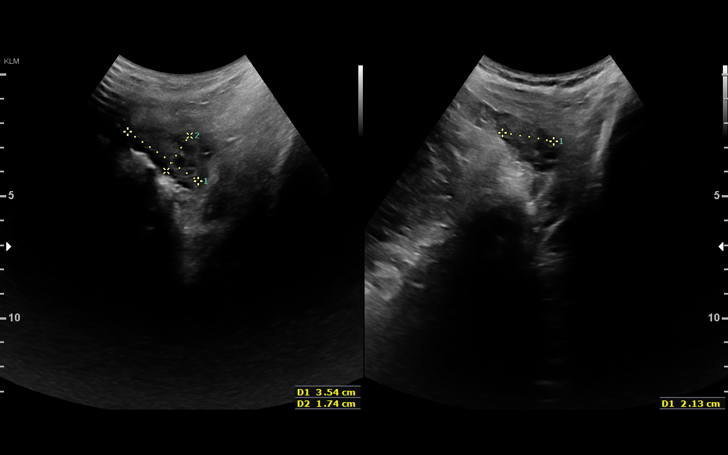

[13 of 21 positions shown; findings below may reference images not displayed]

OB/Gyn Clinic

1  MIYUKA FAJKA             [id number]      [id number]     [id number]
Indications

30 weeks gestation of pregnancy
Leakage of amniotic fluid
Preterm contractions
OB History

Gravidity:    2         Term:   1
Living:       1
Fetal Evaluation

Num Of Fetuses:     1
Fetal Heart         131
Rate(bpm):
Cardiac Activity:   Observed
Presentation:       Cephalic
Placenta:           Posterior, above cervical os
P. Cord Insertion:  Visualized

Amniotic Fluid
AFI FV:      Subjectively within normal limits

AFI Sum(cm)     %Tile       Largest Pocket(cm)
13.17           40
RUQ(cm)       RLQ(cm)       LUQ(cm)        LLQ(cm)
3.97
Gestational Age

LMP:           30w 5d        Date:  12/20/16                 EDD:   09/26/17
Best:          30w 5d     Det. By:  LMP  (12/20/16)          EDD:   09/26/17
Anatomy

Stomach:               Appears normal, left   Bladder:                Appears normal
sided
Cervix Uterus Adnexa

Cervix
Not visualized (advanced GA >56wks)

Uterus
No abnormality visualized.

Left Ovary
Within normal limits.

Right Ovary
Within normal limits.

Cul De Sac:   No free fluid seen.

Adnexa:       No abnormality visualized.
Impression

IUP at 30+5 weeks with suspected PPOM
Normal fetal movement and cardiac activity
Normal placentation
Normal amniotic fluid with AFI of 13.2 cm
Recommendations

Continue clinical evaluation and management

## 2019-01-02 ENCOUNTER — Ambulatory Visit (HOSPITAL_COMMUNITY)
Admission: EM | Admit: 2019-01-02 | Discharge: 2019-01-02 | Disposition: A | Discharge status: Home / Self Care | Payer: Self-pay | Attending: Internal Medicine | Admitting: Internal Medicine

## 2019-01-02 ENCOUNTER — Encounter (HOSPITAL_COMMUNITY): Payer: Self-pay | Admitting: Emergency Medicine

## 2019-01-02 ENCOUNTER — Other Ambulatory Visit: Payer: Self-pay

## 2019-01-02 DIAGNOSIS — K0401 Reversible pulpitis: Secondary | ICD-10-CM

## 2019-01-02 MED ORDER — AMOXICILLIN-POT CLAVULANATE 875-125 MG PO TABS: 1.0000 | ORAL_TABLET | Freq: Two times a day (BID) | ORAL | 0 refills | Status: DC

## 2019-01-02 NOTE — ED Triage Notes (Signed)
Pt sts dental pain from wisdom tooth

## 2019-01-02 NOTE — ED Notes (Signed)
Patient able to ambulate independently  

## 2019-01-02 NOTE — ED Provider Notes (Signed)
[facility]    CSN: [id number] Arrival date & time: 01/02/19  1150     History   Chief Complaint Chief Complaint  Patient presents with  . Dental Pain    HPI Alexandra Lowery is a 23 y.o. female no past medical history comes to urgent care with complaints of tooth ache of 1 week duration.  Patient has a cracked tooth with exposure of the dentine.  Started hurting about a week ago.  Pain is throbbing and extends over the whole right side of her face.  No fever or chills.  No facial swelling.  No nausea vomiting.  Patient is scheduled to see her dentist sometime this week or next week.   HPI  Past Medical History:  Diagnosis Date  . Chlamydia 01/18/2017  . Gonorrhea 2015 and 2017  . Hx of migraines     Patient Active Problem List   Diagnosis Date Noted  . Insufficient prenatal care in first trimester 04/21/2017  . Anemia affecting pregnancy, antepartum 04/21/2017  . Encounter for supervision of normal pregnancy, antepartum 04/10/2017  . Chlamydia infection affecting pregnancy in first trimester 01/22/2017    Past Surgical History:  Procedure Laterality Date  . WISDOM TOOTH EXTRACTION      OB History    Gravida  2   Para  1   Term  1   Preterm      AB      Living  1     SAB      TAB      Ectopic      Multiple      Live Births  1            Home Medications    Prior to Admission medications   Medication Sig Start Date End Date Taking? Authorizing Provider  acetaminophen (TYLENOL) 500 MG tablet Take 500 mg by mouth every 6 (six) hours as needed for mild pain.    [provider]  amoxicillin-clavulanate (AUGMENTIN) 875-125 MG tablet Take 1 tablet by mouth every 12 (twelve) hours. 01/02/19   Delberta Folts, Britta MccreedyPhilip O, MD  ferrous sulfate 325 (65 FE) MG tablet Take 1 tablet (325 mg total) by mouth 3 (three) times daily with meals. 07/23/17   Degele, Kandra NicolasJulie P, MD  ibuprofen (ADVIL,MOTRIN) 600 MG tablet Take 1 tablet (600 mg total) by  mouth every 8 (eight) hours as needed for moderate pain. 08/08/18   Shaune PollackIsaacs, Cameron, MD  nitrofurantoin, macrocrystal-monohydrate, (MACROBID) 100 MG capsule Take 1 capsule (100 mg total) by mouth 2 (two) times daily. 08/06/17   Karim-Rhoades, Kae HellerWalidah N, CNM  polyethylene glycol powder (GLYCOLAX/MIRALAX) powder Take 17 g 2 (two) times daily as needed by mouth for moderate constipation. 06/24/17   Constant, Peggy, MD  Prenatal Vit-Fe Fumarate-FA (PRENATAL MULTIVITAMIN) TABS tablet Take 1 tablet by mouth daily at 12 noon.    [provider]  terconazole (TERAZOL 7) 0.4 % vaginal cream Place 1 applicator vaginally at bedtime. 08/06/17   Amedeo GoryKarim-Rhoades, Walidah N, CNM    Family History Family History  Problem Relation Age of Onset  . Hypertension Maternal Grandmother     Social History Social History   Tobacco Use  . Smoking status: Never Smoker  . Smokeless tobacco: Never Used  Substance Use Topics  . Alcohol use: No  . Drug use: No     Allergies   Patient has no known allergies.   Review of Systems Review of Systems  Constitutional: Negative.   HENT: Positive  for dental problem and ear pain. Negative for facial swelling, hearing loss, rhinorrhea, sinus pressure, sinus pain and sore throat.   Respiratory: Negative.   Cardiovascular: Negative.   Gastrointestinal: Negative.   Musculoskeletal: Negative.      Physical Exam Triage Vital Signs ED Triage Vitals [01/02/19 1213]  Enc Vitals Group     BP 119/62     Pulse Rate 89     Resp 18     Temp 98.2 F (36.8 C)     Temp Source Oral     SpO2 99 %     Weight      Height      Head Circumference      Peak Flow      Pain Score 9     Pain Loc      Pain Edu?      Excl. in GC?    No data found.  Updated Vital Signs BP 119/62 (BP Location: Right Arm)   Pulse 89   Temp 98.2 F (36.8 C) (Oral)   Resp 18   SpO2 99%   Visual Acuity Right Eye Distance:   Left Eye Distance:   Bilateral Distance:    Right Eye  Near:   Left Eye Near:    Bilateral Near:     Physical Exam HENT:     Mouth/Throat:     Pharynx: No oropharyngeal exudate.     Comments: Second right upper molar tooth with caries.  No erythema no swelling of the gum. Neck:     Musculoskeletal: Normal range of motion.  Cardiovascular:     Rate and Rhythm: Normal rate and regular rhythm.     Pulses: Normal pulses.     Heart sounds: Normal heart sounds.  Pulmonary:     Effort: Pulmonary effort is normal.     Breath sounds: Normal breath sounds.  Abdominal:     General: Bowel sounds are normal.     Palpations: Abdomen is soft.  Skin:    Capillary Refill: Capillary refill takes less than 2 seconds.  Neurological:     General: No focal deficit present.      UC Treatments / Results  Labs (all labs ordered are listed, but only abnormal results are displayed) Labs Reviewed - No data to display  EKG None  Radiology No results found.  Procedures Procedures (including critical care time)  Medications Ordered in UC Medications - No data to display  Initial Impression / Assessment and Plan / UC Course  I have reviewed the triage vital signs and the nursing notes.  Pertinent labs & imaging results that were available during my care of the patient were reviewed by me and considered in my medical decision making (see chart for details).     1. Pulpitis: Tylenol/Motrin for pain management Augmentin 875-125 mg twice daily for 7 days Patient is advised to see a dentist for tooth extraction If no improvement in his symptoms patient is advised to seek dental care sooner than later.  Final Clinical Impressions(s) / UC Diagnoses   Final diagnoses:  Acute pulpitis   Discharge Instructions   None    ED Prescriptions    Medication Sig Dispense Auth. Provider   amoxicillin-clavulanate (AUGMENTIN) 875-125 MG tablet Take 1 tablet by mouth every 12 (twelve) hours. 14 tablet Shaman Muscarella, Britta Mccreedy, MD     Controlled Substance  Prescriptions [place] Controlled Substance Registry consulted? No   Merrilee Jansky, MD 01/02/19 1239

## 2019-01-28 ENCOUNTER — Other Ambulatory Visit: Payer: Self-pay

## 2019-01-28 ENCOUNTER — Encounter (HOSPITAL_COMMUNITY): Payer: Self-pay | Admitting: Emergency Medicine

## 2019-01-28 ENCOUNTER — Ambulatory Visit (HOSPITAL_COMMUNITY)
Admission: EM | Admit: 2019-01-28 | Discharge: 2019-01-28 | Disposition: A | Discharge status: Home / Self Care | Payer: Self-pay | Attending: Internal Medicine | Admitting: Internal Medicine

## 2019-01-28 DIAGNOSIS — K0889 Other specified disorders of teeth and supporting structures: Secondary | ICD-10-CM

## 2019-01-28 LAB — POCT PREGNANCY, URINE: Preg Test, Ur: NEGATIVE

## 2019-01-28 MED ORDER — IBUPROFEN 800 MG PO TABS: 800.0000 mg | ORAL_TABLET | Freq: Three times a day (TID) | ORAL | 0 refills | Status: DC

## 2019-01-28 MED ORDER — HYDROCODONE-ACETAMINOPHEN 5-325 MG PO TABS: 1.0000 | ORAL_TABLET | Freq: Four times a day (QID) | ORAL | 0 refills | Status: DC | PRN

## 2019-01-28 MED ORDER — AMOXICILLIN-POT CLAVULANATE 875-125 MG PO TABS: 1.0000 | ORAL_TABLET | Freq: Two times a day (BID) | ORAL | 0 refills | Status: DC

## 2019-01-28 NOTE — ED Triage Notes (Addendum)
Patient has top and bottom dental pain, patient has a broken tooth and now has swelling   Patient reports menstrual cycle only last 2 days instead of 5.

## 2019-01-28 NOTE — Discharge Instructions (Signed)

## 2019-01-29 NOTE — ED Provider Notes (Signed)
[place]    CSN: [id number] Arrival date & time: 01/28/19  1426      History   Chief Complaint Chief Complaint  Patient presents with  . Dental Pain    HPI Alexandra Lowery is a 23 y.o. female history of migraines, presenting today for evaluation of dental pain.  Patient believes that her wisdom teeth are coming in and has pain to her upper and lower right jaws as well as her left lower jaw.  She states that this pain is been going on for approximately a month.  She was seen here previously and given antibiotics which did help temporarily.  Her pain has returned recently and has become more intense.  She has been unable to follow-up with dentistry and she is waiting for her Medicaid to begin.  She denies any fevers.  Denies trouble swallowing.  Denies neck stiffness or neck swelling.  She has been taking Tylenol, Aleve and ibuprofen without relief.   HPI  Past Medical History:  Diagnosis Date  . Chlamydia 01/18/2017  . Gonorrhea 2015 and 2017  . Hx of migraines     Patient Active Problem List   Diagnosis Date Noted  . Insufficient prenatal care in first trimester 04/21/2017  . Anemia affecting pregnancy, antepartum 04/21/2017  . Encounter for supervision of normal pregnancy, antepartum 04/10/2017  . Chlamydia infection affecting pregnancy in first trimester 01/22/2017    Past Surgical History:  Procedure Laterality Date  . WISDOM TOOTH EXTRACTION      OB History    Gravida  2   Para  1   Term  1   Preterm      AB      Living  1     SAB      TAB      Ectopic      Multiple      Live Births  1            Home Medications    Prior to Admission medications   Medication Sig Start Date End Date Taking? Authorizing Provider  NON FORMULARY Patient has been taking an otc pain medicine this morning, unsure what this medication is   Yes [provider]  acetaminophen (TYLENOL) 500 MG tablet Take 500 mg by mouth every 6 (six)  hours as needed for mild pain.    [provider]  amoxicillin-clavulanate (AUGMENTIN) 875-125 MG tablet Take 1 tablet by mouth every 12 (twelve) hours. 01/28/19   Wieters, Hallie C, PA-C  HYDROcodone-acetaminophen (NORCO/VICODIN) 5-325 MG tablet Take 1 tablet by mouth every 6 (six) hours as needed for severe pain. 01/28/19   Wieters, Hallie C, PA-C  ibuprofen (ADVIL) 800 MG tablet Take 1 tablet (800 mg total) by mouth 3 (three) times daily. 01/28/19   Wieters, Hallie C, PA-C  polyethylene glycol powder (GLYCOLAX/MIRALAX) powder Take 17 g 2 (two) times daily as needed by mouth for moderate constipation. 06/24/17   Constant, Peggy, MD  ferrous sulfate 325 (65 FE) MG tablet Take 1 tablet (325 mg total) by mouth 3 (three) times daily with meals. 07/23/17 01/28/19  Degele, Jenne Pane, MD    Family History Family History  Problem Relation Age of Onset  . Hypertension Maternal Grandmother     Social History Social History   Tobacco Use  . Smoking status: Never Smoker  . Smokeless tobacco: Never Used  Substance Use Topics  . Alcohol use: No  . Drug use: No     Allergies  Patient has no known allergies.   Review of Systems Review of Systems  Constitutional: Negative for activity change, appetite change, chills, fatigue and fever.  HENT: Positive for dental problem. Negative for congestion, ear pain, rhinorrhea, sinus pressure, sore throat and trouble swallowing.   Eyes: Negative for discharge and redness.  Respiratory: Negative for cough, chest tightness and shortness of breath.   Cardiovascular: Negative for chest pain.  Gastrointestinal: Negative for abdominal pain, diarrhea, nausea and vomiting.  Musculoskeletal: Negative for myalgias.  Skin: Negative for rash.  Neurological: Negative for dizziness, light-headedness and headaches.     Physical Exam Triage Vital Signs ED Triage Vitals  Enc Vitals Group     BP 01/28/19 1509 112/64     Pulse Rate 01/28/19 1509 63     Resp  01/28/19 1509 18     Temp 01/28/19 1509 98.7 F (37.1 C)     Temp Source 01/28/19 1509 Oral     SpO2 01/28/19 1509 100 %     Weight --      Height --      Head Circumference --      Peak Flow --      Pain Score 01/28/19 1506 10     Pain Loc --      Pain Edu? --      Excl. in GC? --    No data found.  Updated Vital Signs BP 112/64 (BP Location: Right Arm)   Pulse 63   Temp 98.7 F (37.1 C) (Oral)   Resp 18   LMP 01/26/2019   SpO2 100%   Visual Acuity Right Eye Distance:   Left Eye Distance:   Bilateral Distance:    Right Eye Near:   Left Eye Near:    Bilateral Near:     Physical Exam Vitals signs and nursing note reviewed.  Constitutional:      General: She is not in acute distress.    Appearance: She is well-developed.  HENT:     Head: Normocephalic and atraumatic.     Ears:     Comments: Bilateral ears without tenderness to palpation of external auricle, tragus and mastoid, EAC's without erythema or swelling, TM's with good bony landmarks and cone of light. Non erythematous.    Mouth/Throat:     Comments: Posterior molar/wisdom teeth seen encroaching other teeth as well as with surrounding gingival erythema, right upper jaw with posterior molar that appears fractured, surrounding gingival tenderness  No soft palate swelling, posterior pharynx patent, uvula midline without swelling Nontender to palpation of soft palate below tongue Eyes:     Conjunctiva/sclera: Conjunctivae normal.  Neck:     Musculoskeletal: Neck supple.     Comments: Full active range of motion of neck, no overlying erythema, swelling or lymphadenopathy Cardiovascular:     Rate and Rhythm: Normal rate and regular rhythm.     Heart sounds: No murmur.  Pulmonary:     Effort: Pulmonary effort is normal. No respiratory distress.     Breath sounds: Normal breath sounds.  Abdominal:     Palpations: Abdomen is soft.     Tenderness: There is no abdominal tenderness.  Skin:    General: Skin is  warm and dry.  Neurological:     Mental Status: She is alert.      UC Treatments / Results  Labs (all labs ordered are listed, but only abnormal results are displayed) Labs Reviewed  POCT PREGNANCY, URINE    EKG None  Radiology No results found.  Procedures Procedures (including critical care time)  Medications Ordered in UC Medications - No data to display  Initial Impression / Assessment and Plan / UC Course  I have reviewed the triage vital signs and the nursing notes.  Pertinent labs & imaging results that were available during my care of the patient were reviewed by me and considered in my medical decision making (see chart for details).     Patient with dental pain, possible possible gingivitis versus dental infection.  Will cover with another round of antibiotics today with Augmentin.  Pain may also be coming from encouraging wisdom teeth putting pressure on other surrounding teeth.  Recommended Tylenol and ibuprofen.  Did provide couple days worth of hydrocodone to use for more severe pain, nighttime pain.  Discussed importance of following up with dentistry for more permanent relief.Discussed strict return precautions. Patient verbalized understanding and is agreeable with plan.  Final Clinical Impressions(s) / UC Diagnoses   Final diagnoses:  Pain, dental     Discharge Instructions     Please use dental resource to contact offices to seek permenant treatment/relief.   Today we have given you an antibiotic. This should help with pain as any infection is cleared.   For pain please take 600mg -800mg  of Ibuprofen every 8 hours, take with 1000 mg of Tylenol Extra strength every 8 hours. These are safe to take together. Please take with food.   I have also provided 2 days worth of stronger pain medication. This should only be used for severe pain. Do not drive or operate machinery while taking this medication.   Please return if you start to experience  significant swelling of your face, experiencing fever.   ED Prescriptions    Medication Sig Dispense Auth. Provider   amoxicillin-clavulanate (AUGMENTIN) 875-125 MG tablet Take 1 tablet by mouth every 12 (twelve) hours. 14 tablet Wieters, Hallie C, PA-C   ibuprofen (ADVIL) 800 MG tablet Take 1 tablet (800 mg total) by mouth 3 (three) times daily. 21 tablet Wieters, Hallie C, PA-C   HYDROcodone-acetaminophen (NORCO/VICODIN) 5-325 MG tablet Take 1 tablet by mouth every 6 (six) hours as needed for severe pain. 8 tablet Wieters, [place] C, PA-C     Controlled Substance Prescriptions [place] Controlled Substance Registry consulted? Yes, I have consulted the [place] Controlled Substances Registry for this patient, and feel the risk/benefit ratio today is favorable for proceeding with this prescription for a controlled substance.   Lew DawesWieters, Hallie C, [place] JerseyPA-C 01/29/19 [phone]

## 2019-08-27 ENCOUNTER — Encounter (HOSPITAL_COMMUNITY): Payer: Self-pay

## 2019-08-27 ENCOUNTER — Ambulatory Visit (HOSPITAL_COMMUNITY)
Admission: EM | Admit: 2019-08-27 | Discharge: 2019-08-27 | Disposition: A | Discharge status: Home / Self Care | Payer: Medicaid Other | Attending: Family Medicine | Admitting: Family Medicine

## 2019-08-27 ENCOUNTER — Other Ambulatory Visit: Payer: Self-pay

## 2019-08-27 DIAGNOSIS — Z3202 Encounter for pregnancy test, result negative: Secondary | ICD-10-CM | POA: Diagnosis not present

## 2019-08-27 DIAGNOSIS — N760 Acute vaginitis: Secondary | ICD-10-CM

## 2019-08-27 LAB — POCT PREGNANCY, URINE: Preg Test, Ur: NEGATIVE

## 2019-08-27 LAB — POCT URINALYSIS DIP (DEVICE)
Bilirubin Urine: NEGATIVE
Glucose, UA: NEGATIVE mg/dL
Hgb urine dipstick: NEGATIVE
Ketones, ur: NEGATIVE mg/dL
Leukocytes,Ua: NEGATIVE
Nitrite: NEGATIVE
Protein, ur: NEGATIVE mg/dL
Specific Gravity, Urine: >=1.03 (ref 1.005–1.030)
Urobilinogen, UA: 0.2 mg/dL (ref 0.0–1.0)
pH: 5.5 (ref 5.0–8.0)

## 2019-08-27 LAB — POC URINE PREG, ED
Preg Test, Ur: NEGATIVE
Preg Test, Ur: NEGATIVE

## 2019-08-27 MED ORDER — METRONIDAZOLE 500 MG PO TABS: 500.0000 mg | ORAL_TABLET | Freq: Two times a day (BID) | ORAL | 0 refills | Status: DC

## 2019-08-27 NOTE — ED Notes (Signed)
Placed specimen in lab

## 2019-08-27 NOTE — Discharge Instructions (Addendum)
I have given you 7 days of metronidazole This will treat bacterial vaginosis The swab is being sent to the laboratory for testing We will call if anything comes back positive

## 2019-08-27 NOTE — ED Triage Notes (Signed)
Pt. States she last had sex ago, since she has been painful & states discharge/odor when urinating. Wants STD testing too.

## 2019-08-27 NOTE — ED Provider Notes (Signed)
[facility]    CSN: [id number] Arrival date & time: 08/27/19  1438      History   Chief Complaint Chief Complaint  Patient presents with  . S74.5    HPI Alexandra Lowery is a 24 y.o. female.   HPI  Patient has moderate vaginal irritation, odor, with no real discharge.  No pelvic pain.  No irregular menstrual bleeding.  Patient is not pregnant.  No sexual relations for 3 months.  No itching.  She would like to have STD testing.  Past Medical History:  Diagnosis Date  . Chlamydia 01/18/2017  . Gonorrhea 2015 and 2017  . Hx of migraines     Patient Active Problem List   Diagnosis Date Noted  . Insufficient prenatal care in first trimester 04/21/2017  . Anemia affecting pregnancy, antepartum 04/21/2017  . Encounter for supervision of normal pregnancy, antepartum 04/10/2017  . Chlamydia infection affecting pregnancy in first trimester 01/22/2017    Past Surgical History:  Procedure Laterality Date  . WISDOM TOOTH EXTRACTION      OB History    Gravida  2   Para  1   Term  1   Preterm      AB      Living  1     SAB      TAB      Ectopic      Multiple      Live Births  1            Home Medications    Prior to Admission medications   Medication Sig Start Date End Date Taking? Authorizing Provider  metroNIDAZOLE (FLAGYL) 500 MG tablet Take 1 tablet (500 mg total) by mouth 2 (two) times daily. 08/27/19   Eustace Moore, MD  ferrous sulfate 325 (65 FE) MG tablet Take 1 tablet (325 mg total) by mouth 3 (three) times daily with meals. 07/23/17 01/28/19  Degele, Kandra Nicolas, MD    Family History Family History  Problem Relation Age of Onset  . Healthy Mother   . Healthy Father   . Hypertension Maternal Grandmother     Social History Social History   Tobacco Use  . Smoking status: Never Smoker  . Smokeless tobacco: Never Used  Substance Use Topics  . Alcohol use: No  . Drug use: No     Allergies   Patient has no known  allergies.   Review of Systems Review of Systems  Constitutional: Negative for fever.  Gastrointestinal: Negative for abdominal pain.  Genitourinary: Positive for vaginal discharge. Negative for dysuria, menstrual problem, vaginal bleeding and vaginal pain.     Physical Exam Triage Vital Signs ED Triage Vitals  Enc Vitals Group     BP 08/27/19 1518 (!) 112/49     Pulse Rate 08/27/19 1518 95     Resp 08/27/19 1518 18     Temp 08/27/19 1518 98.5 F (36.9 C)     Temp Source 08/27/19 1518 Oral     SpO2 08/27/19 1518 92 %     Weight 08/27/19 1513 189 lb (85.7 kg)     Height --      Head Circumference --      Peak Flow --      Pain Score 08/27/19 1515 7     Pain Loc --      Pain Edu? --      Excl. in GC? --    No data found.  Updated Vital Signs BP (!) 112/49 (  BP Location: Right Arm)   Pulse 95   Temp 98.5 F (36.9 C) (Oral)   Resp 18   Wt 85.7 kg   LMP 06/04/2019   SpO2 92%   Breastfeeding No   BMI 27.91 kg/m      Physical Exam Constitutional:      General: She is not in acute distress.    Appearance: She is well-developed.  HENT:     Head: Normocephalic and atraumatic.  Eyes:     Conjunctiva/sclera: Conjunctivae normal.     Pupils: Pupils are equal, round, and reactive to light.  Cardiovascular:     Rate and Rhythm: Normal rate.  Pulmonary:     Effort: Pulmonary effort is normal. No respiratory distress.  Abdominal:     General: There is no distension.     Palpations: Abdomen is soft.  Genitourinary:    Comments: Exam not indicated.  Vaginal swab sent Musculoskeletal:        General: Normal range of motion.     Cervical back: Normal range of motion.  Skin:    General: Skin is warm and dry.  Neurological:     Mental Status: She is alert.  Psychiatric:        Mood and Affect: Mood normal.        Behavior: Behavior normal.      UC Treatments / Results  Labs (all labs ordered are listed, but only abnormal results are displayed) Labs Reviewed    POC URINE PREG, ED  POCT URINALYSIS DIP (DEVICE)  POCT PREGNANCY, URINE  POC URINE PREG, ED  CERVICOVAGINAL ANCILLARY ONLY  Pregnancy test is negative Urinalysis is negative  EKG   Radiology No results found.  Procedures Procedures (including critical care time)  Medications Ordered in UC Medications - No data to display  Initial Impression / Assessment and Plan / UC Course  I have reviewed the triage vital signs and the nursing notes.  Pertinent labs & imaging results that were available during my care of the patient were reviewed by me and considered in my medical decision making (see chart for details).      Final Clinical Impressions(s) / UC Diagnoses   Final diagnoses:  Acute vaginitis     Discharge Instructions     I have given you 7 days of metronidazole This will treat bacterial vaginosis The swab is being sent to the laboratory for testing We will call if anything comes back positive   ED Prescriptions    Medication Sig Dispense Auth. Provider   metroNIDAZOLE (FLAGYL) 500 MG tablet Take 1 tablet (500 mg total) by mouth 2 (two) times daily. 14 tablet Raylene Everts, MD     PDMP not reviewed this encounter.   Raylene Everts, MD 08/27/19 2041

## 2019-08-28 LAB — CERVICOVAGINAL ANCILLARY ONLY
Chlamydia: NEGATIVE
Neisseria Gonorrhea: NEGATIVE
Trichomonas: NEGATIVE

## 2020-02-02 ENCOUNTER — Encounter (HOSPITAL_COMMUNITY): Payer: Self-pay | Admitting: Emergency Medicine

## 2020-02-02 ENCOUNTER — Other Ambulatory Visit: Payer: Self-pay

## 2020-02-02 ENCOUNTER — Emergency Department (HOSPITAL_COMMUNITY): Payer: Medicaid Other

## 2020-02-02 ENCOUNTER — Emergency Department (HOSPITAL_COMMUNITY)
Admission: EM | Admit: 2020-02-02 | Discharge: 2020-02-02 | Disposition: A | Discharge status: Home / Self Care | Payer: Medicaid Other | Attending: Emergency Medicine | Admitting: Emergency Medicine

## 2020-02-02 ENCOUNTER — Ambulatory Visit (HOSPITAL_COMMUNITY)
Admission: EM | Admit: 2020-02-02 | Discharge: 2020-02-02 | Disposition: A | Discharge status: Home / Self Care | Payer: Medicaid Other | Attending: Physician Assistant | Admitting: Physician Assistant

## 2020-02-02 ENCOUNTER — Encounter (HOSPITAL_COMMUNITY): Payer: Self-pay

## 2020-02-02 DIAGNOSIS — R079 Chest pain, unspecified: Secondary | ICD-10-CM

## 2020-02-02 DIAGNOSIS — R9431 Abnormal electrocardiogram [ECG] [EKG]: Secondary | ICD-10-CM | POA: Diagnosis not present

## 2020-02-02 DIAGNOSIS — R42 Dizziness and giddiness: Secondary | ICD-10-CM

## 2020-02-02 DIAGNOSIS — H538 Other visual disturbances: Secondary | ICD-10-CM | POA: Diagnosis not present

## 2020-02-02 DIAGNOSIS — R0789 Other chest pain: Secondary | ICD-10-CM | POA: Diagnosis present

## 2020-02-02 LAB — BASIC METABOLIC PANEL
Anion gap: 9 (ref 5–15)
BUN: 9 mg/dL (ref 6–20)
CO2: 20 mmol/L — ABNORMAL LOW (ref 22–32)
Calcium: 9.5 mg/dL (ref 8.9–10.3)
Chloride: 109 mmol/L (ref 98–111)
Creatinine, Ser: 0.88 mg/dL (ref 0.44–1.00)
GFR calc Af Amer: >60 mL/min (ref >60)
GFR calc non Af Amer: >60 mL/min (ref >60)
Glucose, Bld: 87 mg/dL (ref 70–99)
Potassium: 3.9 mmol/L (ref 3.5–5.1)
Sodium: 138 mmol/L (ref 135–145)

## 2020-02-02 LAB — CBC
HCT: 35.9 % — ABNORMAL LOW (ref 36.0–46.0)
Hemoglobin: 10.1 g/dL — ABNORMAL LOW (ref 12.0–15.0)
MCH: 18.7 pg — ABNORMAL LOW (ref 26.0–34.0)
MCHC: 28.1 g/dL — ABNORMAL LOW (ref 30.0–36.0)
MCV: 66.5 fL — ABNORMAL LOW (ref 80.0–100.0)
Platelets: 324 10*3/uL (ref 150–400)
RBC: 5.4 MIL/uL — ABNORMAL HIGH (ref 3.87–5.11)
RDW: 22.1 % — ABNORMAL HIGH (ref 11.5–15.5)
WBC: 9.2 10*3/uL (ref 4.0–10.5)
nRBC: 0 % (ref 0.0–0.2)

## 2020-02-02 LAB — I-STAT BETA HCG BLOOD, ED (MC, WL, AP ONLY): I-stat hCG, quantitative: <5 m[IU]/mL (ref <5)

## 2020-02-02 LAB — TROPONIN I (HIGH SENSITIVITY): Troponin I (High Sensitivity): <2 ng/L (ref <18)

## 2020-02-02 MED ORDER — OMEPRAZOLE 20 MG PO CPDR
20.0000 mg | DELAYED_RELEASE_CAPSULE | Freq: Every day | ORAL | 0 refills | Status: DC
Start: 2020-02-02 — End: 2021-06-05

## 2020-02-02 MED ORDER — SODIUM CHLORIDE 0.9% FLUSH: 3.0000 mL | Freq: Once | INTRAVENOUS | Status: DC

## 2020-02-02 MED ORDER — IBUPROFEN 400 MG PO TABS
600.0000 mg | ORAL_TABLET | Freq: Once | ORAL | Status: AC
  Administered 2020-02-02: 600 mg via ORAL
  Filled 2020-02-02: qty 1

## 2020-02-02 NOTE — ED Triage Notes (Signed)
Pt. Stated, I started having chest tightness and pain. And every time I try to get up my chest will tighten

## 2020-02-02 NOTE — ED Triage Notes (Signed)
Pt presents today for chest tightness and shortness of breath x2 days. Pt states chest pain is only present when lying down, describes pain as sharp. Pt identifies pain at sternum, denies radiating pain to arms, or shoulder blades. Pt states pain is reproducible to palpation, and worse with palpation. Pt states she is only short of breath during episodes of pain. Pt noted to be breathing even and unlabored at this time with o2 sats of 100 on RA. Pt states she has been treating with tylenol and ibuprofen with out relief. No meds on board PTA.

## 2020-02-02 NOTE — ED Notes (Signed)
Patient is being discharged from the Urgent Care Center and sent to the Emergency Department via wheelchair by staff. Per Cam Hai, PA, patient is stable but in need of higher level of care due to chest pain, blurred vision. Patient is aware and verbalizes understanding of plan of care.  Vitals:   02/02/20 1032  BP: 119/77  Pulse: 86  Resp: 16  Temp: 99 F (37.2 C)  SpO2: 100%

## 2020-02-02 NOTE — Discharge Instructions (Addendum)
Given the symptoms and exam today, you need to be further evaluated in the Emergency Department today, our staff will escort you there

## 2020-02-02 NOTE — ED Provider Notes (Signed)
[facility]    CSN: [id number] Arrival date & time: 02/02/20  1011      History   Chief Complaint No chief complaint on file.    Alexandra Lowery is a 24 y.o. female.   Patient presents for chest pain, pain with breathing and feeling lightheaded.  She reports chest pain became suddenly worse over the last 4 days.  She reports it hurts to lay down primarily but is also there when she sitting up.  She reports last night and today the pain has been coming and going every 30 seconds and is more frequent when laying flat.  She also reports yesterday she became quite lightheaded throughout the day having a sit down a few times.  She reports today she is had some lightheadedness but also is reporting some change in her vision.  She reports she has not passed out but yesterday felt like she may get close to this.  She denies contact or glasses wearing.  No eye injuries.  She reports the chest pain is worse with deep breath and this occasionally takes her breath away.  She is not entirely clear whether she is short of breath at rest or if the pain is primarily causing this.  She points to her sternum when describing where the pain is.  She reports she has had pain in her chest before but this is different.  She denies feeling like her heart is beating fast.  Pain does not radiate to her left arm or neck.  She has not been sweaty or nauseous.  Denies cough, respiratory symptoms.  Denies abdominal pain.  She reports she is otherwise been healthy without any issues.  Last menstrual period was in May.  She denies recent lower extremity swelling.  She is not on any sort of birth control.     Past Medical History:  Diagnosis Date  . Chlamydia 01/18/2017  . Gonorrhea 2015 and 2017  . Hx of migraines     Patient Active Problem List   Diagnosis Date Noted  . Insufficient prenatal care in first trimester 04/21/2017  . Anemia affecting pregnancy, antepartum 04/21/2017  . Encounter for  supervision of normal pregnancy, antepartum 04/10/2017  . Chlamydia infection affecting pregnancy in first trimester 01/22/2017    Past Surgical History:  Procedure Laterality Date  . WISDOM TOOTH EXTRACTION      OB History    Gravida  2   Para  1   Term  1   Preterm      AB      Living  1     SAB      TAB      Ectopic      Multiple      Live Births  1            Home Medications    Prior to Admission medications   Medication Sig Start Date End Date Taking? Authorizing Provider  metroNIDAZOLE (FLAGYL) 500 MG tablet Take 1 tablet (500 mg total) by mouth 2 (two) times daily. 08/27/19   Eustace Moore, MD  ferrous sulfate 325 (65 FE) MG tablet Take 1 tablet (325 mg total) by mouth 3 (three) times daily with meals. 07/23/17 01/28/19  Degele, Kandra Nicolas, MD    Family History Family History  Problem Relation Age of Onset  . Healthy Mother   . Healthy Father   . Hypertension Maternal Grandmother     Social History Social History   Tobacco  Use  . Smoking status: Never Smoker  . Smokeless tobacco: Never Used  Vaping Use  . Vaping Use: Never used  Substance Use Topics  . Alcohol use: No  . Drug use: No     Allergies   Patient has no known allergies.   Review of Systems Review of Systems   Physical Exam Triage Vital Signs ED Triage Vitals  Enc Vitals Group     BP 02/02/20 1032 119/77     Pulse Rate 02/02/20 1032 86     Resp 02/02/20 1032 16     Temp 02/02/20 1032 99 F (37.2 C)     Temp Source 02/02/20 1032 Oral     SpO2 02/02/20 1032 100 %     Weight --      Height --      Head Circumference --      Peak Flow --      Pain Score 02/02/20 1033 10     Pain Loc --      Pain Edu? --      Excl. in [place]? --    Orthostatic VS for the past 24 hrs:  BP- Lying Pulse- Lying BP- Sitting Pulse- Sitting BP- Standing at 0 minutes Pulse- Standing at 0 minutes  02/02/20 1157 104/68 64 106/66 66 94/66 74    Updated Vital Signs BP 119/77 (BP  Location: Right Arm)   Pulse 86   Temp 99 F (37.2 C) (Oral)   Resp 16   LMP 12/21/2019 (Approximate)   SpO2 100%   Visual Acuity Right Eye Distance:   Left Eye Distance:   Bilateral Distance:    Right Eye Near:   Left Eye Near:    Bilateral Near:     Physical Exam Vitals and nursing note reviewed.  Constitutional:      General: She is not in acute distress.    Appearance: She is well-developed.     Comments: Patient appears uncomfortable  HENT:     Head: Normocephalic and atraumatic.     Nose: Nose normal.     Mouth/Throat:     Mouth: Mucous membranes are moist.     Pharynx: Oropharynx is clear.  Eyes:     General: No scleral icterus.    Extraocular Movements: Extraocular movements intact.     Comments: Conjunctiva slightly pale  Patient reports blurry vision when standing during orthostatics.  This did not correct with seated position.  Cardiovascular:     Rate and Rhythm: Normal rate and regular rhythm.     Heart sounds: No murmur heard.   Pulmonary:     Effort: Pulmonary effort is normal. No respiratory distress.     Breath sounds: Normal breath sounds.  Chest:     Chest wall: Tenderness (Sternal tenderness) present.  Abdominal:     Palpations: Abdomen is soft.     Tenderness: There is no abdominal tenderness.  Musculoskeletal:     Cervical back: Neck supple.     Right lower leg: No edema.     Left lower leg: No edema.     Comments: Pain in chest is not reproducible with movement only palpation.  Skin:    General: Skin is warm and dry.  Neurological:     Mental Status: She is alert.     Comments: No nystagmus.      UC Treatments / Results  Labs (all labs ordered are listed, but only abnormal results are displayed) Labs Reviewed - No data to display  EKG Sinus  rhythm with shortened PR interval at 0.11.  Abnormal EKG  Radiology No results found.  Procedures Procedures (including critical care time)  Medications Ordered in UC Medications -  No data to display  Initial Impression / Assessment and Plan / UC Course  I have reviewed the triage vital signs and the nursing notes.  Pertinent labs & imaging results that were available during my care of the patient were reviewed by me and considered in my medical decision making (see chart for details).     #Chest pain #Abnormal EKG #Lightheadedness #Blurry vision Patient is a 24 year old female presenting with chest pain, lightheadedness and blurred vision.  Found to have a shortened PR interval on EKG, not conclusive for WPW.  Rate was normal.  Patient not orthostatic however does have blurred vision and some lightheadedness with standing.  Encouraging pain is reproducible, and age only and less likely cardiac however without a completely normal EKG intermittent arrhythmia cannot be fully ruled out.  Given she self transported in persistent symptoms of lightheadedness and vision changes during visit felt she needs further evaluation in the emergency department today to rule out more serious conditions given the constellation of symptoms as presented.  She has stable vital signs but given she drove I will have staff member escort her to the [facility] emergency department.  Patient is agreeable to this plan of care.   Final Clinical Impressions(s) / UC Diagnoses   Final diagnoses:  Chest pain, unspecified type  Abnormal ECG  Light headedness  Blurred vision     Discharge Instructions     Given the symptoms and exam today, you need to be further evaluated in the Emergency Department today, our staff will escort you there    ED Prescriptions    None     PDMP not reviewed this encounter.   Hermelinda Medicus, PA-C 02/02/20 1326

## 2020-02-02 NOTE — Discharge Instructions (Signed)
We saw you in the ER for chest pain. Please take ibuprofen every 6 hours. Also take medications for acid reflux.

## 2020-02-03 NOTE — ED Provider Notes (Signed)
[facility] EMERGENCY DEPARTMENT Provider Note   CSN: [id number] Arrival date & time: 02/02/20  1217     History Chief Complaint  Patient presents with  . Chest Pain    Alexandra Lowery is a 24 y.o. female.  HPI    24 year old female comes in a chief complaint of chest pain.  Patient reports that she been having chest pain for the last week or so.  Chest pain is described as midsternal and substernal chest pain that has been fairly constant.  The pain is worse when she is laying down.  She denies any exertional component of pleuritic component to the pain. Pt has no hx of PE, DVT and denies any exogenous hormone (testosterone / estrogen) use, long distance travels or surgery in the past 6 weeks, active cancer, recent immobilization.   Past Medical History:  Diagnosis Date  . Chlamydia 01/18/2017  . Gonorrhea 2015 and 2017  . Hx of migraines     Patient Active Problem List   Diagnosis Date Noted  . Insufficient prenatal care in first trimester 04/21/2017  . Anemia affecting pregnancy, antepartum 04/21/2017  . Encounter for supervision of normal pregnancy, antepartum 04/10/2017  . Chlamydia infection affecting pregnancy in first trimester 01/22/2017    Past Surgical History:  Procedure Laterality Date  . WISDOM TOOTH EXTRACTION       OB History    Gravida  2   Para  1   Term  1   Preterm      AB      Living  1     SAB      TAB      Ectopic      Multiple      Live Births  1           Family History  Problem Relation Age of Onset  . Healthy Mother   . Healthy Father   . Hypertension Maternal Grandmother     Social History   Tobacco Use  . Smoking status: Never Smoker  . Smokeless tobacco: Never Used  Vaping Use  . Vaping Use: Never used  Substance Use Topics  . Alcohol use: No  . Drug use: No    Home Medications Prior to Admission medications   Medication Sig Start Date End Date Taking? Authorizing Provider    metroNIDAZOLE (FLAGYL) 500 MG tablet Take 1 tablet (500 mg total) by mouth 2 (two) times daily. Patient not taking: Reported on 02/02/2020 08/27/19   Eustace Moore, MD  omeprazole (PRILOSEC) 20 MG capsule Take 1 capsule (20 mg total) by mouth daily. 02/02/20   Derwood Kaplan, MD  ferrous sulfate 325 (65 FE) MG tablet Take 1 tablet (325 mg total) by mouth 3 (three) times daily with meals. 07/23/17 01/28/19  Degele, Kandra Nicolas, MD    Allergies    Patient has no known allergies.  Review of Systems   Review of Systems  Constitutional: Positive for activity change.  Respiratory: Negative for shortness of breath.   Cardiovascular: Positive for chest pain.  Gastrointestinal: Negative for nausea.  Neurological: Negative for dizziness.  All other systems reviewed and are negative.   Physical Exam Updated Vital Signs BP 128/82 (BP Location: Left Arm)   Pulse 76   Temp 98.8 F (37.1 C) (Oral)   Resp 20   Ht 5' 10.5" (1.791 m)   Wt 86.2 kg   LMP 12/22/2019   SpO2 100%   BMI 26.88 kg/m   Physical  Exam Vitals and nursing note reviewed.  Constitutional:      Appearance: She is well-developed.  HENT:     Head: Normocephalic and atraumatic.  Cardiovascular:     Rate and Rhythm: Normal rate.  Pulmonary:     Effort: Pulmonary effort is normal.  Abdominal:     General: Bowel sounds are normal.  Musculoskeletal:     Cervical back: Normal range of motion and neck supple.  Skin:    General: Skin is warm and dry.  Neurological:     Mental Status: She is alert and oriented to person, place, and time.     ED Results / Procedures / Treatments   Labs (all labs ordered are listed, but only abnormal results are displayed) Labs Reviewed  BASIC METABOLIC PANEL - Abnormal; Notable for the following components:      Result Value   CO2 20 (*)    All other components within normal limits  CBC - Abnormal; Notable for the following components:   RBC 5.40 (*)    Hemoglobin 10.1 (*)    HCT  35.9 (*)    MCV 66.5 (*)    MCH 18.7 (*)    MCHC 28.1 (*)    RDW 22.1 (*)    All other components within normal limits  I-STAT BETA HCG BLOOD, ED ([facility], [facility], [facility] ONLY)  TROPONIN I (HIGH SENSITIVITY)  TROPONIN I (HIGH SENSITIVITY)    EKG EKG Interpretation  Date/Time:  Tuesday February 02 2020 12:41:33 EDT Ventricular Rate:  73 PR Interval:  110 QRS Duration: 92 QT Interval:  402 QTC Calculation: 442 R Axis:   45 Text Interpretation: Sinus rhythm with sinus arrhythmia with short PR Otherwise normal ECG No acute changes Confirmed by Varney Biles ([id number]) on 02/02/2020 8:46:15 PM   Radiology DG Chest 2 View  Result Date: 02/02/2020 CLINICAL DATA:  Chest pain. EXAM: CHEST - 2 VIEW COMPARISON:  No recent. FINDINGS: Mediastinum hilar structures normal. Lungs are clear. No pleural effusion or pneumothorax. Heart size normal. Mild thoracic spine scoliosis. IMPRESSION: No acute cardiopulmonary disease. Electronically Signed   By: Marcello Moores  Register   On: 02/02/2020 14:27    Procedures Procedures (including critical care time)  Medications Ordered in ED Medications  ibuprofen (ADVIL) tablet 600 mg (600 mg Oral Given 02/02/20 2053)    ED Course  I have reviewed the triage vital signs and the nursing notes.  Pertinent labs & imaging results that were available during my care of the patient were reviewed by me and considered in my medical decision making (see chart for details).    MDM Rules/Calculators/A&P HEAR Score: 1                        24 year old comes in a chief complaint of chest pain.  She is having nonspecific chest pain for the last week.  Pain is constant.  HEAR score is 1.  There is a pleuritic component to the pain and patient alleges that the pain is positional, and worse when laying.  Although the pain is not improved when she sits up.  Differential diagnosis includes pericarditis, PE, pneumonia, pleurisy, esophagitis. Suspicion for ACS is quite low.  Hear score is 1.   Initial troponin and EKG are reassuring. Patient's well score is 0 and she is PERC negative.  Doubt PE at this time.  Suspicion is that she might have mild pericarditis versus costochondritis/esophagitis.  We will treat conservatively and advised patient to come back  to the ER if her symptoms get worse.  Patient is in agreement with the plan.  Final Clinical Impression(s) / ED Diagnoses Final diagnoses:  Nonspecific chest pain    Rx / DC Orders ED Discharge Orders         Ordered    omeprazole (PRILOSEC) 20 MG capsule  Daily     Discontinue  Reprint     02/02/20 2050           Derwood Kaplan, MD 02/03/20 2215

## 2020-07-04 ENCOUNTER — Ambulatory Visit (HOSPITAL_COMMUNITY)
Admission: EM | Admit: 2020-07-04 | Discharge: 2020-07-04 | Disposition: A | Discharge status: Home / Self Care | Payer: Medicaid Other | Attending: Family Medicine | Admitting: Family Medicine

## 2020-07-04 ENCOUNTER — Encounter (HOSPITAL_COMMUNITY): Payer: Self-pay

## 2020-07-04 ENCOUNTER — Other Ambulatory Visit: Payer: Self-pay

## 2020-07-04 DIAGNOSIS — H44002 Unspecified purulent endophthalmitis, left eye: Secondary | ICD-10-CM

## 2020-07-04 MED ORDER — TOBRAMYCIN 0.3 % OP SOLN
1.0000 [drp] | OPHTHALMIC | 0 refills | Status: DC
Start: 2020-07-04 — End: 2021-06-05

## 2020-07-04 MED ORDER — CEPHALEXIN 500 MG PO CAPS
500.0000 mg | ORAL_CAPSULE | Freq: Two times a day (BID) | ORAL | 0 refills | Status: DC
Start: 2020-07-04 — End: 2021-06-05

## 2020-07-04 NOTE — ED Provider Notes (Signed)
[facility]    CSN: [id number] Arrival date & time: 07/04/20  1458      History   Chief Complaint Chief Complaint  Patient presents with  . Eye Pain    HPI Alexandra Lowery is a 24 y.o. female.   HPI  Here for eye infection Present for a month Yellow discharge in corner of eye in the morning Pain under upper lid Vision is normal  Past Medical History:  Diagnosis Date  . Chlamydia 01/18/2017  . Gonorrhea 2015 and 2017  . Hx of migraines     Patient Active Problem List   Diagnosis Date Noted  . Insufficient prenatal care in first trimester 04/21/2017  . Anemia affecting pregnancy, antepartum 04/21/2017  . Encounter for supervision of normal pregnancy, antepartum 04/10/2017  . Chlamydia infection affecting pregnancy in first trimester 01/22/2017    Past Surgical History:  Procedure Laterality Date  . WISDOM TOOTH EXTRACTION      OB History    Gravida  2   Para  1   Term  1   Preterm      AB      Living  1     SAB      TAB      Ectopic      Multiple      Live Births  1            Home Medications    Prior to Admission medications   Medication Sig Start Date End Date Taking? Authorizing Provider  cephALEXin (KEFLEX) 500 MG capsule Take 1 capsule (500 mg total) by mouth 2 (two) times daily. 07/04/20   Eustace Moore, MD  omeprazole (PRILOSEC) 20 MG capsule Take 1 capsule (20 mg total) by mouth daily. 02/02/20   Derwood Kaplan, MD  tobramycin (TOBREX) 0.3 % ophthalmic solution Place 1 drop into the left eye every 4 (four) hours. 07/04/20   Eustace Moore, MD  ferrous sulfate 325 (65 FE) MG tablet Take 1 tablet (325 mg total) by mouth 3 (three) times daily with meals. 07/23/17 01/28/19  Degele, Kandra Nicolas, MD    Family History Family History  Problem Relation Age of Onset  . Healthy Mother   . Healthy Father   . Hypertension Maternal Grandmother     Social History Social History   Tobacco Use  . Smoking  status: Never Smoker  . Smokeless tobacco: Never Used  Vaping Use  . Vaping Use: Never used  Substance Use Topics  . Alcohol use: No  . Drug use: No     Allergies   Patient has no known allergies.   Review of Systems Review of Systems See hPI  Physical Exam Triage Vital Signs ED Triage Vitals  Enc Vitals Group     BP 07/04/20 1610 113/70     Pulse Rate 07/04/20 1610 62     Resp 07/04/20 1610 18     Temp 07/04/20 1610 98.4 F (36.9 C)     Temp Source 07/04/20 1610 Oral     SpO2 07/04/20 1610 100 %     Weight --      Height --      Head Circumference --      Peak Flow --      Pain Score 07/04/20 1608 8     Pain Loc --      Pain Edu? --      Excl. in [place]? --    No data found.  Updated  Vital Signs BP 113/70 (BP Location: Right Arm)   Pulse 62   Temp 98.4 F (36.9 C) (Oral)   Resp 18   LMP 06/30/2020 (Approximate)   SpO2 100%      Physical Exam Constitutional:      General: She is not in acute distress.    Appearance: She is well-developed.  HENT:     Head: Normocephalic and atraumatic.  Eyes:     General: Lids are normal. Lids are everted, no foreign bodies appreciated. Vision grossly intact. Gaze aligned appropriately.        Right eye: No foreign body.        Left eye: Discharge present.No foreign body.     Conjunctiva/sclera: Conjunctivae normal.     Pupils: Pupils are equal, round, and reactive to light.   Cardiovascular:     Rate and Rhythm: Normal rate.  Pulmonary:     Effort: Pulmonary effort is normal. No respiratory distress.  Abdominal:     General: There is no distension.     Palpations: Abdomen is soft.  Musculoskeletal:        General: Normal range of motion.     Cervical back: Normal range of motion.  Skin:    General: Skin is warm and dry.  Neurological:     Mental Status: She is alert.      UC Treatments / Results  Labs (all labs ordered are listed, but only abnormal results are displayed) Labs Reviewed - No data to  display  EKG   Radiology No results found.  Procedures Procedures (including critical care time)  Medications Ordered in UC Medications - No data to display  Initial Impression / Assessment and Plan / UC Course  I have reviewed the triage vital signs and the nursing notes.  Pertinent labs & imaging results that were available during my care of the patient were reviewed by me and considered in my medical decision making (see chart for details).     Will treat with antibiotics and warm compresses Final Clinical Impressions(s) / UC Diagnoses   Final diagnoses:  Eye infection, left     Discharge Instructions     Use the eyedrop every 4 hours while awake Take antibiotic 2 times a day for 5 days Call the eye doctor if not improving in 3 to 4 days   ED Prescriptions    Medication Sig Dispense Auth. Provider   cephALEXin (KEFLEX) 500 MG capsule Take 1 capsule (500 mg total) by mouth 2 (two) times daily. 10 capsule Eustace Moore, MD   tobramycin (TOBREX) 0.3 % ophthalmic solution Place 1 drop into the left eye every 4 (four) hours. 5 mL Eustace Moore, MD     PDMP not reviewed this encounter.   Eustace Moore, MD 07/04/20 1714

## 2020-07-04 NOTE — Discharge Instructions (Addendum)
Use the eyedrop every 4 hours while awake Take antibiotic 2 times a day for 5 days Call the eye doctor if not improving in 3 to 4 days

## 2020-07-04 NOTE — ED Triage Notes (Signed)
Pt in with c/o left eye drainage and crusting that has been going on for 1 month. States that sometimes she  wakes up and can't open her eye because it is swollen.  Pt has been using warm compress and Claritin with no relief  States that she feels like she has a knot in her eye

## 2020-10-28 ENCOUNTER — Other Ambulatory Visit: Payer: Self-pay

## 2020-10-28 ENCOUNTER — Ambulatory Visit (HOSPITAL_COMMUNITY)
Admission: EM | Admit: 2020-10-28 | Discharge: 2020-10-28 | Disposition: A | Discharge status: Home / Self Care | Payer: Medicaid Other | Attending: Emergency Medicine | Admitting: Emergency Medicine

## 2020-10-28 ENCOUNTER — Encounter (HOSPITAL_COMMUNITY): Payer: Self-pay | Admitting: Emergency Medicine

## 2020-10-28 DIAGNOSIS — H538 Other visual disturbances: Secondary | ICD-10-CM

## 2020-10-28 DIAGNOSIS — H5712 Ocular pain, left eye: Secondary | ICD-10-CM

## 2020-10-28 DIAGNOSIS — H1032 Unspecified acute conjunctivitis, left eye: Secondary | ICD-10-CM | POA: Diagnosis not present

## 2020-10-28 MED ORDER — ERYTHROMYCIN 5 MG/GM OP OINT
TOPICAL_OINTMENT | OPHTHALMIC | 0 refills | Status: DC
Start: 2020-10-28 — End: 2021-06-05

## 2020-10-28 NOTE — Discharge Instructions (Signed)
Use the erythromycin eye ointment 4 times a day for 7 days.  Keep using even if your eye feels better.   Use warm compress for pain and swelling.   You can take Ibuprofen or Tylenol as needed for pain relief.   Follow up with an eye doctor (ophthalmologist) if symptoms do not improve in the next 5-7 days.

## 2020-10-28 NOTE — ED Provider Notes (Signed)
[facility]   [id number] 10/28/20 Arrival Time: 1519  CC: EYE REDNESS  SUBJECTIVE:  Alexandra Lowery is a 24 y.o. female who presents with complaint of eye redness that began last night. Denies a precipitating event, trauma, or close contacts with similar symptoms. Has not tried OTC medications for this. Seen and evaluated for similar symptoms in Nov and treated with antibiotic drops.  Reports symptoms did not improve for almost a month and returned last night.  Reports havign discharge, blurry vision, and pain. Denies fever, chills, nausea, vomiting,  painful eye movements, halos, itching, double vision, FB sensation, periorbital erythema.   Denies contact lens use.    ROS: As per HPI.  All other pertinent ROS negative.     Past Medical History:  Diagnosis Date  . Chlamydia 01/18/2017  . Gonorrhea 2015 and 2017  . Hx of migraines    Past Surgical History:  Procedure Laterality Date  . WISDOM TOOTH EXTRACTION     No Known Allergies No current facility-administered medications on file prior to encounter.   Current Outpatient Medications on File Prior to Encounter  Medication Sig Dispense Refill  . cephALEXin (KEFLEX) 500 MG capsule Take 1 capsule (500 mg total) by mouth 2 (two) times daily. 10 capsule 0  . omeprazole (PRILOSEC) 20 MG capsule Take 1 capsule (20 mg total) by mouth daily. 30 capsule 0  . tobramycin (TOBREX) 0.3 % ophthalmic solution Place 1 drop into the left eye every 4 (four) hours. 5 mL 0  . [DISCONTINUED] ferrous sulfate 325 (65 FE) MG tablet Take 1 tablet (325 mg total) by mouth 3 (three) times daily with meals. 90 tablet 1   Social History   Socioeconomic History  . Marital status: Single    Spouse name: Not on file  . Number of children: Not on file  . Years of education: Not on file  . Highest education level: Not on file  Occupational History  . Not on file  Tobacco Use  . Smoking status: Never Smoker  . Smokeless tobacco: Never Used   Vaping Use  . Vaping Use: Never used  Substance and Sexual Activity  . Alcohol use: No  . Drug use: No  . Sexual activity: Not Currently    Partners: Male    Birth control/protection: None  Other Topics Concern  . Not on file  Social History Narrative  . Not on file   Social Determinants of Health   Financial Resource Strain: Not on file  Food Insecurity: Not on file  Transportation Needs: Not on file  Physical Activity: Not on file  Stress: Not on file  Social Connections: Not on file  Intimate Partner Violence: Not on file   Family History  Problem Relation Age of Onset  . Healthy Mother   . Healthy Father   . Hypertension Maternal Grandmother     OBJECTIVE:    Visual Acuity  Right Eye Distance: 20/30 Left Eye Distance: 20/30 Bilateral Distance: 20/30  Right Eye Near:   Left Eye Near:    Bilateral Near:      Vitals:   10/28/20 1554  BP: 109/65  Pulse: 89  Resp: 17  Temp: 99.1 F (37.3 C)  TempSrc: Oral  SpO2: 100%   Physical Exam Vitals and nursing note reviewed.  Constitutional:      General: She is not in acute distress.    Appearance: She is not ill-appearing, toxic-appearing or diaphoretic.  HENT:     Head: Normocephalic and atraumatic.  Nose: Nose normal.  Eyes:     General:        Left eye: Discharge present.    Extraocular Movements: Extraocular movements intact.     Conjunctiva/sclera:     Left eye: Left conjunctiva is injected. Exudate present. No chemosis or hemorrhage.    Pupils: Pupils are equal, round, and reactive to light.     Comments: Purulent drainage to left eye and crusting noted in eyelashes   Cardiovascular:     Rate and Rhythm: Normal rate.     Pulses: Normal pulses.  Pulmonary:     Effort: Pulmonary effort is normal.  Musculoskeletal:        General: Normal range of motion.     Cervical back: Normal range of motion and neck supple.  Skin:    General: Skin is warm and dry.  Neurological:     General: No focal  deficit present.     Mental Status: She is alert.  Psychiatric:        Mood and Affect: Mood normal.     ASSESSMENT & PLAN:  1. Acute conjunctivitis of left eye, unspecified acute conjunctivitis type   2. Pain of left eye   3. Blurry vision     Meds ordered this encounter  Medications  . erythromycin ophthalmic ointment    Sig: Place a 1/2 inch ribbon of ointment into the lower eyelid 4 times a day for 7 days    Dispense:  3.5 g    Refill:  0    Order Specific Question:   Supervising Provider    Answer:   Merrilee Jansky [[id number]]     Conjunctivitis Use erythromycin ointment as prescribed and to completion Warm compresses for pain, swelling Wash pillow cases, wash hands regularly with soap and water, avoid touching your face and eyes, wash door handles, light switches, remotes and other objects you frequently touch Follow up with ophthalmology if symptoms do not improve in the next few days   Return or follow up with PCP if symptoms persists such as fever, chills, redness, swelling, eye pain, painful eye movements, vision changes, etc...  Reviewed expectations re: course of current medical issues. Questions answered. Outlined signs and symptoms indicating need for more acute intervention. Patient verbalized understanding. After Visit Summary given.   Ivette Loyal, NP 10/28/20 1627

## 2020-10-28 NOTE — ED Triage Notes (Signed)
Patient c/o LFT lower eye swelling since last night.   Patient states "this keeps flaring up, I started having issues with this in November, the medication ya'll gave me didn't work".  Patient endorses green pus drainage from eye.   Patient endorses itchiness and headache.   Patient endorses blurriness in the LFT eye at times.

## 2020-12-14 ENCOUNTER — Ambulatory Visit (HOSPITAL_COMMUNITY)
Admission: EM | Admit: 2020-12-14 | Discharge: 2020-12-14 | Disposition: A | Discharge status: Home / Self Care | Payer: Medicaid Other | Attending: Emergency Medicine | Admitting: Emergency Medicine

## 2020-12-14 ENCOUNTER — Other Ambulatory Visit: Payer: Self-pay

## 2020-12-14 ENCOUNTER — Encounter (HOSPITAL_COMMUNITY): Payer: Self-pay

## 2020-12-14 DIAGNOSIS — J069 Acute upper respiratory infection, unspecified: Secondary | ICD-10-CM | POA: Diagnosis not present

## 2020-12-14 MED ORDER — PROMETHAZINE-DM 6.25-15 MG/5ML PO SYRP
5.0000 mL | ORAL_SOLUTION | Freq: Four times a day (QID) | ORAL | 0 refills | Status: DC | PRN
Start: 2020-12-14 — End: 2021-06-05

## 2020-12-14 MED ORDER — BENZONATATE 100 MG PO CAPS
200.0000 mg | ORAL_CAPSULE | Freq: Three times a day (TID) | ORAL | 0 refills | Status: DC | PRN
Start: 2020-12-14 — End: 2021-06-05

## 2020-12-14 MED ORDER — FLUTICASONE PROPIONATE 50 MCG/ACT NA SUSP
2.0000 | Freq: Every day | NASAL | 2 refills | Status: DC
Start: 2020-12-14 — End: 2021-06-05

## 2020-12-14 NOTE — ED Triage Notes (Signed)
Pt presents with non productive cough, sore throat, generalized body aches, and nasal drainage X 6 days.

## 2020-12-14 NOTE — Discharge Instructions (Signed)
You most likely have viral illness.   You can take Tylenol and/or Ibuprofen as needed for fever reduction and pain relief.   You can use the Tessalon perles as needed for cough.  You can use the Promethazine DM as needed for cough as night.   You can use Flonase 2 sprays in each nostril daily. I would also recommend either Zyrtec, Claritin, or Allegra daily.   For cough: honey 1/2 to 1 teaspoon (you can dilute the honey in water or another fluid).  You can use a humidifier for chest congestion and cough.  If you don't have a humidifier, you can sit in the bathroom with the hot shower running.    For sore throat: try warm salt water gargles, cepacol lozenges, throat spray, warm tea or water with lemon/honey, popsicles or ice, or OTC cold relief medicine for throat discomfort.    For congestion: take a daily anti-histamine like Zyrtec, Claritin, and a oral decongestant to help with post nasal drip that may be irritating your throat.    It is important to stay hydrated: drink plenty of fluids (water, gatorade/powerade/pedialyte, juices, or teas) to keep your throat moisturized and help further relieve irritation/discomfort.   Return or go to the Emergency Department if symptoms worsen or do not improve in the next few days.

## 2020-12-14 NOTE — ED Provider Notes (Signed)
[facility]    CSN: [id number] Arrival date & time: 12/14/20  7341      History   Chief Complaint Chief Complaint  Patient presents with  . URI  . Generalized Body Aches  . Cough    HPI Alexandra Lowery is a 25 y.o. female.   Patient here for evaluation of sore throat, cough, and body aches that have been ongoing for the past several days.  Also reports nasal congestion. Denies any recent sick contacts. Reports being vaccinated against COVID.  Has not tried any OTC medications or treatments.  Denies any trauma, injury, or other precipitating event.  Denies any specific alleviating or aggravating factors.  Denies any fevers, chest pain, shortness of breath, N/V/D, numbness, tingling, weakness, abdominal pain, or headaches.   ROS: As per HPI, all other pertinent ROS negative   The history is provided by the patient.    Past Medical History:  Diagnosis Date  . Chlamydia 01/18/2017  . Gonorrhea 2015 and 2017  . Hx of migraines     Patient Active Problem List   Diagnosis Date Noted  . Insufficient prenatal care in first trimester 04/21/2017  . Anemia affecting pregnancy, antepartum 04/21/2017  . Encounter for supervision of normal pregnancy, antepartum 04/10/2017  . Chlamydia infection affecting pregnancy in first trimester 01/22/2017    Past Surgical History:  Procedure Laterality Date  . WISDOM TOOTH EXTRACTION      OB History    Gravida  2   Para  1   Term  1   Preterm      AB      Living  1     SAB      IAB      Ectopic      Multiple      Live Births  1            Home Medications    Prior to Admission medications   Medication Sig Start Date End Date Taking? Authorizing Provider  benzonatate (TESSALON PERLES) 100 MG capsule Take 2 capsules (200 mg total) by mouth 3 (three) times daily as needed for cough. 12/14/20  Yes Ivette Loyal, NP  fluticasone (FLONASE) 50 MCG/ACT nasal spray Place 2 sprays into both nostrils daily.  12/14/20  Yes Ivette Loyal, NP  promethazine-dextromethorphan (PROMETHAZINE-DM) 6.25-15 MG/5ML syrup Take 5 mLs by mouth 4 (four) times daily as needed for cough. 12/14/20  Yes Ivette Loyal, NP  cephALEXin (KEFLEX) 500 MG capsule Take 1 capsule (500 mg total) by mouth 2 (two) times daily. 07/04/20   Eustace Moore, MD  erythromycin ophthalmic ointment Place a 1/2 inch ribbon of ointment into the lower eyelid 4 times a day for 7 days 10/28/20   Ivette Loyal, NP  omeprazole (PRILOSEC) 20 MG capsule Take 1 capsule (20 mg total) by mouth daily. 02/02/20   Derwood Kaplan, MD  tobramycin (TOBREX) 0.3 % ophthalmic solution Place 1 drop into the left eye every 4 (four) hours. 07/04/20   Eustace Moore, MD  ferrous sulfate 325 (65 FE) MG tablet Take 1 tablet (325 mg total) by mouth 3 (three) times daily with meals. 07/23/17 01/28/19  Degele, Kandra Nicolas, MD    Family History Family History  Problem Relation Age of Onset  . Healthy Mother   . Healthy Father   . Hypertension Maternal Grandmother     Social History Social History   Tobacco Use  . Smoking status: Never Smoker  .  Smokeless tobacco: Never Used  Vaping Use  . Vaping Use: Never used  Substance Use Topics  . Alcohol use: No  . Drug use: No     Allergies   Patient has no known allergies.   Review of Systems Review of Systems  HENT: Positive for congestion, sore throat and trouble swallowing.   Respiratory: Positive for cough.   All other systems reviewed and are negative.    Physical Exam Triage Vital Signs ED Triage Vitals  Enc Vitals Group     BP 12/14/20 0914 (!) 110/59     Pulse Rate 12/14/20 0914 81     Resp 12/14/20 0914 17     Temp 12/14/20 0914 98.9 F (37.2 C)     Temp Source 12/14/20 0914 Oral     SpO2 12/14/20 0914 100 %     Weight --      Height --      Head Circumference --      Peak Flow --      Pain Score 12/14/20 0913 6     Pain Loc --      Pain Edu? --      Excl. in GC? --    No  data found.  Updated Vital Signs BP (!) 110/59 (BP Location: Left Arm)   Pulse 81   Temp 98.9 F (37.2 C) (Oral)   Resp 17   LMP 12/08/2020   SpO2 100%   Visual Acuity Right Eye Distance:   Left Eye Distance:   Bilateral Distance:    Right Eye Near:   Left Eye Near:    Bilateral Near:     Physical Exam Vitals and nursing note reviewed.  Constitutional:      General: She is not in acute distress.    Appearance: Normal appearance. She is not ill-appearing, toxic-appearing or diaphoretic.  HENT:     Head: Normocephalic and atraumatic.     Nose: Congestion present.     Mouth/Throat:     Pharynx: Uvula midline. Posterior oropharyngeal erythema present. No pharyngeal swelling or oropharyngeal exudate.     Tonsils: No tonsillar exudate. 0 on the right. 0 on the left.  Eyes:     Conjunctiva/sclera: Conjunctivae normal.  Cardiovascular:     Rate and Rhythm: Normal rate.     Pulses: Normal pulses.     Heart sounds: Normal heart sounds.  Pulmonary:     Effort: Pulmonary effort is normal.     Breath sounds: Normal breath sounds. No decreased breath sounds, wheezing, rhonchi or rales.     Comments: Pt does have mild-moderate cough Abdominal:     General: Abdomen is flat.  Musculoskeletal:        General: Normal range of motion.     Cervical back: Normal range of motion.  Skin:    General: Skin is warm and dry.  Neurological:     General: No focal deficit present.     Mental Status: She is alert and oriented to person, place, and time.  Psychiatric:        Mood and Affect: Mood normal.      UC Treatments / Results  Labs (all labs ordered are listed, but only abnormal results are displayed) Labs Reviewed - No data to display  EKG   Radiology No results found.  Procedures Procedures (including critical care time)  Medications Ordered in UC Medications - No data to display  Initial Impression / Assessment and Plan / UC Course  I have reviewed  the triage vital  signs and the nursing notes.  Pertinent labs & imaging results that were available during my care of the patient were reviewed by me and considered in my medical decision making (see chart for details).     Assessment negative for red flags or concerns.  Likely viral URI with cough.  Patient declines COVID testing at this time.  Encouraged fluids and rest.  Prescribed Flonase 2 sprays in each nare daily for nasal congestion.  Tessalon perles and Promethazine DM as needed for cough.  Discussed conservative symptom management as described below in discharge instructions.  Follow up as needed.  \ Final Clinical Impressions(s) / UC Diagnoses   Final diagnoses:  Viral URI with cough     Discharge Instructions     You most likely have viral illness.   You can take Tylenol and/or Ibuprofen as needed for fever reduction and pain relief.   You can use the Tessalon perles as needed for cough.  You can use the Promethazine DM as needed for cough as night.   You can use Flonase 2 sprays in each nostril daily. I would also recommend either Zyrtec, Claritin, or Allegra daily.   For cough: honey 1/2 to 1 teaspoon (you can dilute the honey in water or another fluid).  You can use a humidifier for chest congestion and cough.  If you don't have a humidifier, you can sit in the bathroom with the hot shower running.    For sore throat: try warm salt water gargles, cepacol lozenges, throat spray, warm tea or water with lemon/honey, popsicles or ice, or OTC cold relief medicine for throat discomfort.    For congestion: take a daily anti-histamine like Zyrtec, Claritin, and a oral decongestant to help with post nasal drip that may be irritating your throat.    It is important to stay hydrated: drink plenty of fluids (water, gatorade/powerade/pedialyte, juices, or teas) to keep your throat moisturized and help further relieve irritation/discomfort.   Return or go to the Emergency Department if symptoms  worsen or do not improve in the next few days.      ED Prescriptions    Medication Sig Dispense Auth. Provider   fluticasone (FLONASE) 50 MCG/ACT nasal spray Place 2 sprays into both nostrils daily. 18.2 mL Ivette Loyal, NP   benzonatate (TESSALON PERLES) 100 MG capsule Take 2 capsules (200 mg total) by mouth 3 (three) times daily as needed for cough. 20 capsule Ivette Loyal, NP   promethazine-dextromethorphan (PROMETHAZINE-DM) 6.25-15 MG/5ML syrup Take 5 mLs by mouth 4 (four) times daily as needed for cough. 118 mL Ivette Loyal, NP     PDMP not reviewed this encounter.   Ivette Loyal, NP 12/14/20 [phone]

## 2021-02-21 ENCOUNTER — Encounter (HOSPITAL_COMMUNITY): Payer: Self-pay

## 2021-02-21 ENCOUNTER — Ambulatory Visit (HOSPITAL_COMMUNITY)
Admission: EM | Admit: 2021-02-21 | Discharge: 2021-02-21 | Disposition: A | Discharge status: Home / Self Care | Payer: Medicaid Other | Attending: Internal Medicine | Admitting: Internal Medicine

## 2021-02-21 ENCOUNTER — Other Ambulatory Visit: Payer: Self-pay

## 2021-02-21 DIAGNOSIS — B9689 Other specified bacterial agents as the cause of diseases classified elsewhere: Secondary | ICD-10-CM | POA: Diagnosis present

## 2021-02-21 DIAGNOSIS — N760 Acute vaginitis: Secondary | ICD-10-CM | POA: Diagnosis present

## 2021-02-21 DIAGNOSIS — N898 Other specified noninflammatory disorders of vagina: Secondary | ICD-10-CM | POA: Insufficient documentation

## 2021-02-21 LAB — POCT URINALYSIS DIPSTICK, ED / UC
Bilirubin Urine: NEGATIVE
Glucose, UA: NEGATIVE mg/dL
Ketones, ur: NEGATIVE mg/dL
Nitrite: NEGATIVE
Protein, ur: NEGATIVE mg/dL
Specific Gravity, Urine: 1.02 (ref 1.005–1.030)
Urobilinogen, UA: 1 mg/dL (ref 0.0–1.0)
pH: 8.5 — ABNORMAL HIGH (ref 5.0–8.0)

## 2021-02-21 LAB — POC URINE PREG, ED: Preg Test, Ur: NEGATIVE

## 2021-02-21 MED ORDER — METRONIDAZOLE 500 MG PO TABS
500.0000 mg | ORAL_TABLET | Freq: Two times a day (BID) | ORAL | 0 refills | Status: AC
Start: 2021-02-21 — End: 2021-02-28

## 2021-02-21 NOTE — Discharge Instructions (Addendum)
You likely have bacterial vaginosis due to symptoms.  You are being treated with antibiotic that will be taken for 7 days.  Urine culture and STD vaginal swab are pending.  We will call if test results are positive.  Please refrain from sexual activity until test results are complete.  Please go the hospital if any pelvic pain, more severe abdominal pain, or fever develop.

## 2021-02-21 NOTE — ED Triage Notes (Signed)
Pt c/o abdominal cramping x 3 days.  States she has been spotting and having vaginal discharge since this morning.   Pt c/o after showering having foul vaginal odor.

## 2021-02-21 NOTE — ED Provider Notes (Signed)
[facility]    CSN: [id number] Arrival date & time: 02/21/21  1605      History   Chief Complaint Chief Complaint  Patient presents with   Abdominal Pain   SEXUALLY TRANSMITTED DISEASE   Vaginal Discharge    HPI Alexandra Lowery is a 25 y.o. female.   Patient presents to the urgent care for 3-week history of intermittent lower abdominal cramping and vaginal discharge.  She describes the discharge as white, malodorous discharge.  Denies any pelvic pain, pain with intercourse, or fever.  Patient does endorse some spotting but states that her period is in a week, and spotting occurred 1 week prior to her previous period as well.  Last menstrual cycle was 02/01/2021.  Denies any known exposure to STD.  Denies any recent unprotected sexual intercourse.  Denies any urinary burning or frequency.   Abdominal Pain Vaginal Discharge  Past Medical History:  Diagnosis Date   Chlamydia 01/18/2017   Gonorrhea 2015 and 2017   Hx of migraines     Patient Active Problem List   Diagnosis Date Noted   Insufficient prenatal care in first trimester 04/21/2017   Anemia affecting pregnancy, antepartum 04/21/2017   Encounter for supervision of normal pregnancy, antepartum 04/10/2017   Chlamydia infection affecting pregnancy in first trimester 01/22/2017    Past Surgical History:  Procedure Laterality Date   WISDOM TOOTH EXTRACTION      OB History     Gravida  2   Para  1   Term  1   Preterm      AB      Living  1      SAB      IAB      Ectopic      Multiple      Live Births  1            Home Medications    Prior to Admission medications   Medication Sig Start Date End Date Taking? Authorizing Provider  metroNIDAZOLE (FLAGYL) 500 MG tablet Take 1 tablet (500 mg total) by mouth 2 (two) times daily for 7 days. 02/21/21 02/28/21 Yes Lance Muss, FNP  benzonatate (TESSALON PERLES) 100 MG capsule Take 2 capsules (200 mg total) by mouth 3 (three)  times daily as needed for cough. 12/14/20   Ivette Loyal, NP  cephALEXin (KEFLEX) 500 MG capsule Take 1 capsule (500 mg total) by mouth 2 (two) times daily. 07/04/20   Eustace Moore, MD  erythromycin ophthalmic ointment Place a 1/2 inch ribbon of ointment into the lower eyelid 4 times a day for 7 days 10/28/20   Ivette Loyal, NP  fluticasone [facility]) 50 MCG/ACT nasal spray Place 2 sprays into both nostrils daily. 12/14/20   Ivette Loyal, NP  omeprazole (PRILOSEC) 20 MG capsule Take 1 capsule (20 mg total) by mouth daily. 02/02/20   Derwood Kaplan, MD  promethazine-dextromethorphan (PROMETHAZINE-DM) 6.25-15 MG/5ML syrup Take 5 mLs by mouth 4 (four) times daily as needed for cough. 12/14/20   Ivette Loyal, NP  tobramycin (TOBREX) 0.3 % ophthalmic solution Place 1 drop into the left eye every 4 (four) hours. 07/04/20   Eustace Moore, MD  ferrous sulfate 325 (65 FE) MG tablet Take 1 tablet (325 mg total) by mouth 3 (three) times daily with meals. 07/23/17 01/28/19  Degele, Kandra Nicolas, MD    Family History Family History  Problem Relation Age of Onset   Healthy Mother  Healthy Father    Hypertension Maternal Grandmother     Social History Social History   Tobacco Use   Smoking status: Never   Smokeless tobacco: Never  Vaping Use   Vaping Use: Never used  Substance Use Topics   Alcohol use: No   Drug use: No     Allergies   Patient has no known allergies.   Review of Systems Review of Systems Per HPI  Physical Exam Triage Vital Signs ED Triage Vitals  Enc Vitals Group     BP 02/21/21 1739 105/73     Pulse Rate 02/21/21 1739 69     Resp 02/21/21 1739 18     Temp 02/21/21 1739 98.5 F (36.9 C)     Temp Source 02/21/21 1739 Oral     SpO2 02/21/21 1739 99 %     Weight --      Height --      Head Circumference --      Peak Flow --      Pain Score 02/21/21 1735 6     Pain Loc --      Pain Edu? --      Excl. in GC? --    No data found.  Updated Vital  Signs BP 105/73 (BP Location: Left Arm)   Pulse 69   Temp 98.5 F (36.9 C) (Oral)   Resp 18   LMP 02/01/2021 (Exact Date)   SpO2 99%   Visual Acuity Right Eye Distance:   Left Eye Distance:   Bilateral Distance:    Right Eye Near:   Left Eye Near:    Bilateral Near:     Physical Exam Constitutional:      General: She is not in acute distress.    Appearance: Normal appearance.  HENT:     Head: Normocephalic and atraumatic.  Eyes:     Extraocular Movements: Extraocular movements intact.     Conjunctiva/sclera: Conjunctivae normal.  Pulmonary:     Effort: Pulmonary effort is normal.  Abdominal:     General: Abdomen is flat. Bowel sounds are normal. There is no distension.     Tenderness: There is no abdominal tenderness.  Genitourinary:    Comments: Deferred with shared decision making.  Self swab performed. Neurological:     General: No focal deficit present.     Mental Status: She is alert and oriented to person, place, and time. Mental status is at baseline.  Psychiatric:        Mood and Affect: Mood normal.        Behavior: Behavior normal.        Thought Content: Thought content normal.        Judgment: Judgment normal.     UC Treatments / Results  Labs (all labs ordered are listed, but only abnormal results are displayed) Labs Reviewed  POCT URINALYSIS DIPSTICK, ED / UC - Abnormal; Notable for the following components:      Result Value   Hgb urine dipstick MODERATE (*)    pH 8.5 (*)    Leukocytes,Ua TRACE (*)    All other components within normal limits  URINE CULTURE  POC URINE PREG, ED  CERVICOVAGINAL ANCILLARY ONLY    EKG   Radiology No results found.  Procedures Procedures (including critical care time)  Medications Ordered in UC Medications - No data to display  Initial Impression / Assessment and Plan / UC Course  I have reviewed the triage vital signs and the nursing notes.  Pertinent  labs & imaging results that were available  during my care of the patient were reviewed by me and considered in my medical decision making (see chart for details).     Suspect bacterial vaginosis due to clinical symptoms and high pH on urinalysis.  Will decide to treat with the shared decision making with patient due to high suspicion for bacterial vaginosis. Vaginal swab for gonorrhea, chlamydia, trichomonas, bacterial vaginosis, candidiasis and urine culture are pending. Will change treatment regimen if results do not show bacterial vaginosis.  Patient was advised to go to the hospital if any pelvic pain or fever develops or if abdominal pain significantly worsens.  Patient to refrain from sexual activity until results are complete and until successful treatment. Discussed strict return precautions. Patient verbalized understanding and is agreeable with plan.  Final Clinical Impressions(s) / UC Diagnoses   Final diagnoses:  BV (bacterial vaginosis)  Vaginal discharge     Discharge Instructions      You likely have bacterial vaginosis due to symptoms.  You are being treated with antibiotic that will be taken for 7 days.  Urine culture and STD vaginal swab are pending.  We will call if test results are positive.  Please refrain from sexual activity until test results are complete.  Please go the hospital if any pelvic pain, more severe abdominal pain, or fever develop.     ED Prescriptions     Medication Sig Dispense Auth. Provider   metroNIDAZOLE (FLAGYL) 500 MG tablet Take 1 tablet (500 mg total) by mouth 2 (two) times daily for 7 days. 14 tablet Lance Muss, FNP      PDMP not reviewed this encounter.   Lance Muss, FNP 02/21/21 [phone]

## 2021-02-23 LAB — CERVICOVAGINAL ANCILLARY ONLY
Bacterial Vaginitis (gardnerella): POSITIVE — AB
Candida Glabrata: NEGATIVE
Candida Vaginitis: POSITIVE — AB
Chlamydia: NEGATIVE
Comment: NEGATIVE
Comment: NEGATIVE
Comment: NEGATIVE
Comment: NEGATIVE
Comment: NEGATIVE
Comment: NORMAL
Neisseria Gonorrhea: NEGATIVE
Trichomonas: NEGATIVE

## 2021-02-23 LAB — URINE CULTURE: Culture: <10000 — AB

## 2021-02-24 ENCOUNTER — Telehealth (HOSPITAL_COMMUNITY): Payer: Self-pay | Admitting: Emergency Medicine

## 2021-02-24 MED ORDER — FLUCONAZOLE 150 MG PO TABS
150.0000 mg | ORAL_TABLET | Freq: Once | ORAL | 0 refills | Status: AC
Start: 2021-02-24 — End: 2021-02-24

## 2021-03-08 ENCOUNTER — Encounter (HOSPITAL_COMMUNITY): Payer: Self-pay

## 2021-03-08 ENCOUNTER — Ambulatory Visit (HOSPITAL_COMMUNITY)
Admission: EM | Admit: 2021-03-08 | Discharge: 2021-03-08 | Disposition: A | Discharge status: Home / Self Care | Payer: Medicaid Other | Attending: Medical Oncology | Admitting: Medical Oncology

## 2021-03-08 ENCOUNTER — Other Ambulatory Visit: Payer: Self-pay

## 2021-03-08 DIAGNOSIS — N898 Other specified noninflammatory disorders of vagina: Secondary | ICD-10-CM | POA: Insufficient documentation

## 2021-03-08 MED ORDER — VALACYCLOVIR HCL 1 G PO TABS: 1000.0000 mg | ORAL_TABLET | Freq: Two times a day (BID) | ORAL | 0 refills | Status: AC

## 2021-03-08 MED ORDER — LIDOCAINE 2 % EX GEL: 1.0000 "application " | Freq: Four times a day (QID) | CUTANEOUS | 0 refills | Status: DC | PRN

## 2021-03-08 NOTE — ED Provider Notes (Signed)
[facility]    CSN: [id number] Arrival date & time: 03/08/21  1107      History   Chief Complaint Chief Complaint  Patient presents with   Vaginal Discharge    HPI Alexandra Lowery is a 25 y.o. female.   HPI  Vaginal Discharge: Patient states that for the past 2 days she has had clear thin vaginal discharge as well as vaginal lesions that are very painful especially when touched.  She states that these look somewhat similar to her friends herpes.  She reports that she had sex with an individual without a condom and 2019 who told her that he had herpes but was not currently having an outbreak.  She has not had any other signs or symptoms of this previously.  She has been trying to pat the area to help with pain.  No fevers, dysuria or pelvic pain.  Past Medical History:  Diagnosis Date   Chlamydia 01/18/2017   Gonorrhea 2015 and 2017   Hx of migraines     Patient Active Problem List   Diagnosis Date Noted   Insufficient prenatal care in first trimester 04/21/2017   Anemia affecting pregnancy, antepartum 04/21/2017   Encounter for supervision of normal pregnancy, antepartum 04/10/2017   Chlamydia infection affecting pregnancy in first trimester 01/22/2017    Past Surgical History:  Procedure Laterality Date   WISDOM TOOTH EXTRACTION      OB History     Gravida  2   Para  1   Term  1   Preterm      AB      Living  1      SAB      IAB      Ectopic      Multiple      Live Births  1            Home Medications    Prior to Admission medications   Medication Sig Start Date End Date Taking? Authorizing Provider  benzonatate (TESSALON PERLES) 100 MG capsule Take 2 capsules (200 mg total) by mouth 3 (three) times daily as needed for cough. 12/14/20   Ivette Loyal, NP  cephALEXin (KEFLEX) 500 MG capsule Take 1 capsule (500 mg total) by mouth 2 (two) times daily. 07/04/20   Eustace Moore, MD  erythromycin ophthalmic ointment Place  a 1/2 inch ribbon of ointment into the lower eyelid 4 times a day for 7 days 10/28/20   Ivette Loyal, NP  fluticasone [facility]) 50 MCG/ACT nasal spray Place 2 sprays into both nostrils daily. 12/14/20   Ivette Loyal, NP  omeprazole (PRILOSEC) 20 MG capsule Take 1 capsule (20 mg total) by mouth daily. 02/02/20   Derwood Kaplan, MD  promethazine-dextromethorphan (PROMETHAZINE-DM) 6.25-15 MG/5ML syrup Take 5 mLs by mouth 4 (four) times daily as needed for cough. 12/14/20   Ivette Loyal, NP  tobramycin (TOBREX) 0.3 % ophthalmic solution Place 1 drop into the left eye every 4 (four) hours. 07/04/20   Eustace Moore, MD  ferrous sulfate 325 (65 FE) MG tablet Take 1 tablet (325 mg total) by mouth 3 (three) times daily with meals. 07/23/17 01/28/19  Degele, Kandra Nicolas, MD    Family History Family History  Problem Relation Age of Onset   Healthy Mother    Healthy Father    Hypertension Maternal Grandmother     Social History Social History   Tobacco Use   Smoking status: Never   Smokeless  tobacco: Never  Vaping Use   Vaping Use: Never used  Substance Use Topics   Alcohol use: No   Drug use: No     Allergies   Patient has no known allergies.   Review of Systems Review of Systems  As stated above in HPI Physical Exam Triage Vital Signs ED Triage Vitals  Enc Vitals Group     BP 03/08/21 1234 104/60     Pulse Rate 03/08/21 1234 96     Resp 03/08/21 1234 18     Temp 03/08/21 1234 98.6 F (37 C)     Temp Source 03/08/21 1234 Oral     SpO2 03/08/21 1234 100 %     Weight --      Height --      Head Circumference --      Peak Flow --      Pain Score 03/08/21 1232 6     Pain Loc --      Pain Edu? --      Excl. in GC? --    No data found.  Updated Vital Signs BP 104/60 (BP Location: Right Arm)   Pulse 96   Temp 98.6 F (37 C) (Oral)   Resp 18   LMP 02/21/2021 (Exact Date)   SpO2 100%   Physical Exam Vitals and nursing note reviewed. Chaperone present: Chaperone  declined.  Genitourinary:    Comments: Few scattered slightly erythematic lesions of the external genitalia.  There is moderate amount of thin white discharge throughout.    UC Treatments / Results  Labs (all labs ordered are listed, but only abnormal results are displayed) Labs Reviewed  SARS CORONAVIRUS 2 (TAT 6-24 HRS)    EKG   Radiology No results found.  Procedures Procedures (including critical care time)  Medications Ordered in UC Medications - No data to display  Initial Impression / Assessment and Plan / UC Course  I have reviewed the triage vital signs and the nursing notes.  Pertinent labs & imaging results that were available during my care of the patient were reviewed by me and considered in my medical decision making (see chart for details).     New.  Discussed with patient that this does appear to be herpes simplex although we have to wait until her test results return to make an official diagnosis.  Since she is within treatment window for Valtrex we are going to go ahead and start this medication and give her lidocaine to use for comfort.  We discussed how to use these medications.  We discussed precautions as well.  STI testing also pending. Final Clinical Impressions(s) / UC Diagnoses   Final diagnoses:  None   Discharge Instructions   None    ED Prescriptions   None    PDMP not reviewed this encounter.   Rushie Chestnut, [place] 03/08/21 1304

## 2021-03-08 NOTE — Discharge Instructions (Addendum)
Not diagnosing you with herpes but in case your test comes back positive this is some information on it.

## 2021-03-08 NOTE — ED Triage Notes (Signed)
Pt in with c/o vaginal discharge and small bumps that she noticed a few days ago  Pt states she has sutures and around the sutures is a dime sized knot that is very tender  Pt states it hurts to sit

## 2021-03-10 ENCOUNTER — Telehealth (HOSPITAL_COMMUNITY): Payer: Self-pay | Admitting: Emergency Medicine

## 2021-03-10 LAB — CERVICOVAGINAL ANCILLARY ONLY
Bacterial Vaginitis (gardnerella): POSITIVE — AB
Candida Glabrata: NEGATIVE
Candida Vaginitis: POSITIVE — AB
Chlamydia: NEGATIVE
Comment: NEGATIVE
Comment: NEGATIVE
Comment: NEGATIVE
Comment: NEGATIVE
Comment: NEGATIVE
Comment: NORMAL
Neisseria Gonorrhea: NEGATIVE
Trichomonas: NEGATIVE

## 2021-03-10 LAB — HSV CULTURE AND TYPING

## 2021-03-10 MED ORDER — FLUCONAZOLE 150 MG PO TABS: 150.0000 mg | ORAL_TABLET | Freq: Once | ORAL | 0 refills | Status: AC

## 2021-03-10 MED ORDER — METRONIDAZOLE 500 MG PO TABS: 500.0000 mg | ORAL_TABLET | Freq: Two times a day (BID) | ORAL | 0 refills | Status: DC

## 2021-04-15 ENCOUNTER — Ambulatory Visit (HOSPITAL_COMMUNITY)
Admission: EM | Admit: 2021-04-15 | Discharge: 2021-04-15 | Disposition: A | Discharge status: Home / Self Care | Payer: Medicaid Other | Attending: Physician Assistant | Admitting: Physician Assistant

## 2021-04-15 ENCOUNTER — Encounter (HOSPITAL_COMMUNITY): Payer: Self-pay | Admitting: Emergency Medicine

## 2021-04-15 DIAGNOSIS — A6004 Herpesviral vulvovaginitis: Secondary | ICD-10-CM | POA: Diagnosis not present

## 2021-04-15 MED ORDER — VALACYCLOVIR HCL 1 G PO TABS: 500.0000 mg | ORAL_TABLET | Freq: Every day | ORAL | 0 refills | Status: AC

## 2021-04-15 NOTE — ED Triage Notes (Signed)
Pt is present today with c/o genital warts vaginal discharge, and vaginal irritation . Pt states that her out breaks stared x3 days agio.

## 2021-04-15 NOTE — ED Provider Notes (Signed)
[facility]    CSN: [id number] Arrival date & time: 04/15/21  1057      History   Chief Complaint Chief Complaint  Patient presents with   Genital Warts    HPI Alexandra Lowery is a 25 y.o. female.   Patient here today for evaluation of herpes simplex outbreak.  She reports current outbreak has been present for the last several days.  She states this is the third outbreak in 1 month, and she is worried she may need suppressive therapy.  She has tried to establish care with primary care provider with no luck.  She states most offices do not have any openings until November.  She denies any other concerns, and declines any further STD screening.    Past Medical History:  Diagnosis Date   Chlamydia 01/18/2017   Gonorrhea 2015 and 2017   Hx of migraines     Patient Active Problem List   Diagnosis Date Noted   Insufficient prenatal care in first trimester 04/21/2017   Anemia affecting pregnancy, antepartum 04/21/2017   Encounter for supervision of normal pregnancy, antepartum 04/10/2017   Chlamydia infection affecting pregnancy in first trimester 01/22/2017    Past Surgical History:  Procedure Laterality Date   WISDOM TOOTH EXTRACTION      OB History     Gravida  2   Para  1   Term  1   Preterm      AB      Living  1      SAB      IAB      Ectopic      Multiple      Live Births  1            Home Medications    Prior to Admission medications   Medication Sig Start Date End Date Taking? Authorizing Provider  valACYclovir (VALTREX) 1000 MG tablet Take 0.5 tablets (500 mg total) by mouth daily. 04/15/21 05/15/21 Yes Tomi Bamberger, PA-C  benzonatate (TESSALON PERLES) 100 MG capsule Take 2 capsules (200 mg total) by mouth 3 (three) times daily as needed for cough. 12/14/20   Ivette Loyal, NP  cephALEXin (KEFLEX) 500 MG capsule Take 1 capsule (500 mg total) by mouth 2 (two) times daily. 07/04/20   Eustace Moore, MD  erythromycin  ophthalmic ointment Place a 1/2 inch ribbon of ointment into the lower eyelid 4 times a day for 7 days 10/28/20   Ivette Loyal, NP  fluticasone [facility]) 50 MCG/ACT nasal spray Place 2 sprays into both nostrils daily. 12/14/20   Ivette Loyal, NP  Lidocaine 2 % GEL Apply 1 application topically 4 (four) times daily as needed. 03/08/21   Rushie Chestnut, PA-C  metroNIDAZOLE (FLAGYL) 500 MG tablet Take 1 tablet (500 mg total) by mouth 2 (two) times daily. 03/10/21   Merrilee Jansky, MD  omeprazole (PRILOSEC) 20 MG capsule Take 1 capsule (20 mg total) by mouth daily. 02/02/20   Derwood Kaplan, MD  promethazine-dextromethorphan (PROMETHAZINE-DM) 6.25-15 MG/5ML syrup Take 5 mLs by mouth 4 (four) times daily as needed for cough. 12/14/20   Ivette Loyal, NP  tobramycin (TOBREX) 0.3 % ophthalmic solution Place 1 drop into the left eye every 4 (four) hours. 07/04/20   Eustace Moore, MD  ferrous sulfate 325 (65 FE) MG tablet Take 1 tablet (325 mg total) by mouth 3 (three) times daily with meals. 07/23/17 01/28/19  Degele, Kandra Nicolas, MD  Family History Family History  Problem Relation Age of Onset   Healthy Mother    Healthy Father    Hypertension Maternal Grandmother     Social History Social History   Tobacco Use   Smoking status: Never   Smokeless tobacco: Never  Vaping Use   Vaping Use: Never used  Substance Use Topics   Alcohol use: No   Drug use: No     Allergies   Patient has no known allergies.   Review of Systems Review of Systems  Constitutional:  Negative for chills and fever.  HENT:  Negative for congestion.   Eyes:  Negative for discharge and redness.  Genitourinary:  Positive for genital sores.    Physical Exam Triage Vital Signs ED Triage Vitals  Enc Vitals Group     BP 04/15/21 1146 97/66     Pulse Rate 04/15/21 1146 72     Resp 04/15/21 1146 17     Temp 04/15/21 1146 99.5 F (37.5 C)     Temp Source 04/15/21 1146 Oral     SpO2 04/15/21 1146 98 %      Weight --      Height --      Head Circumference --      Peak Flow --      Pain Score 04/15/21 1144 9     Pain Loc --      Pain Edu? --      Excl. in GC? --    No data found.  Updated Vital Signs BP 97/66   Pulse 72   Temp 99.5 F (37.5 C) (Oral)   Resp 17   SpO2 98%       Physical Exam Vitals and nursing note reviewed.  Constitutional:      Appearance: Normal appearance.  HENT:     Head: Normocephalic and atraumatic.     Nose: Nose normal.  Cardiovascular:     Rate and Rhythm: Normal rate.  Pulmonary:     Effort: Pulmonary effort is normal.  Neurological:     Mental Status: She is alert.  Psychiatric:        Mood and Affect: Mood normal.        Thought Content: Thought content normal.     UC Treatments / Results  Labs (all labs ordered are listed, but only abnormal results are displayed) Labs Reviewed - No data to display  EKG   Radiology No results found.  Procedures Procedures (including critical care time)  Medications Ordered in UC Medications - No data to display  Initial Impression / Assessment and Plan / UC Course  I have reviewed the triage vital signs and the nursing notes.  Pertinent labs & imaging results that were available during my care of the patient were reviewed by me and considered in my medical decision making (see chart for details).    Valtrex refilled for daily use for the next month.  Information provided for local GYN office, which hopefully she will be able to get into sooner than PCP.  Encouraged follow-up in office with any further concerns or questions.  Final Clinical Impressions(s) / UC Diagnoses   Final diagnoses:  Herpes simplex vulvovaginitis     Discharge Instructions      Take 1/2 tab Valtrex daily. Follow up with any concerns.      ED Prescriptions     Medication Sig Dispense Auth. Provider   valACYclovir (VALTREX) 1000 MG tablet Take 0.5 tablets (500 mg total) by mouth daily.  15 tablet Tomi Bamberger, PA-C      PDMP not reviewed this encounter.   Tomi Bamberger, PA-C 04/15/21 1219

## 2021-04-15 NOTE — Discharge Instructions (Addendum)
Take 1/2 tab Valtrex daily. Follow up with any concerns.

## 2021-06-05 ENCOUNTER — Ambulatory Visit (INDEPENDENT_AMBULATORY_CARE_PROVIDER_SITE_OTHER): Payer: Medicaid Other | Admitting: Family

## 2021-06-05 ENCOUNTER — Encounter: Payer: Self-pay | Admitting: Family

## 2021-06-05 ENCOUNTER — Other Ambulatory Visit: Payer: Self-pay

## 2021-06-05 VITALS — BP 100/70 | HR 66 | Temp 98.4°F | Resp 18 | Ht 70.5 in | Wt 181.4 lb

## 2021-06-05 DIAGNOSIS — R300 Dysuria: Secondary | ICD-10-CM

## 2021-06-05 DIAGNOSIS — Z6825 Body mass index (BMI) 25.0-25.9, adult: Secondary | ICD-10-CM

## 2021-06-05 DIAGNOSIS — R252 Cramp and spasm: Secondary | ICD-10-CM

## 2021-06-05 DIAGNOSIS — Z7689 Persons encountering health services in other specified circumstances: Secondary | ICD-10-CM | POA: Diagnosis not present

## 2021-06-05 DIAGNOSIS — A6004 Herpesviral vulvovaginitis: Secondary | ICD-10-CM | POA: Diagnosis not present

## 2021-06-05 DIAGNOSIS — E663 Overweight: Secondary | ICD-10-CM

## 2021-06-05 LAB — POCT URINALYSIS DIPSTICK
Bilirubin, UA: NEGATIVE
Blood, UA: NEGATIVE
Glucose, UA: NEGATIVE
Ketones, UA: NEGATIVE
Leukocytes, UA: NEGATIVE
Nitrite, UA: NEGATIVE
Protein, UA: NEGATIVE
Spec Grav, UA: >=1.03 — AB (ref 1.010–1.025)
Urobilinogen, UA: 0.2 E.U./dL
pH, UA: 6 (ref 5.0–8.0)

## 2021-06-05 LAB — POCT URINE PREGNANCY: Preg Test, Ur: NEGATIVE

## 2021-06-05 MED ORDER — VALACYCLOVIR HCL 1 G PO TABS: 1000.0000 mg | ORAL_TABLET | Freq: Three times a day (TID) | ORAL | 1 refills | Status: DC

## 2021-06-05 MED ORDER — VALACYCLOVIR HCL 1 G PO TABS: 1000.0000 mg | ORAL_TABLET | Freq: Three times a day (TID) | ORAL | 1 refills | Status: AC

## 2021-06-05 NOTE — Progress Notes (Signed)
Provider: Carilyn Goodpasture Jonette Wassel FNP-C   Pcp, No  Patient Care Team: Pcp, No as PCP - General  Extended Emergency Contact Information Primary Emergency Contact: [name],April Address: [address]          [place], [place] [address] [place] Home Phone: [phone] Mobile Phone: [phone] Relation: Mother  Code Status:  Full Code  Goals of care: Advanced Directive information Advanced Directives 06/05/2021  Does Patient Have a Medical Advance Directive? No  Would patient like information on creating a medical advance directive? -     Chief Complaint  Patient presents with   Establish Care    New patient to establish care. Patient following up on HPV. Patient keeps having outbreaks back to back. Patient currently has outbreak.    HPI:  Pt is a 25 y.o. female seen today to establish care for medical management of chronic diseases. Has a medical history of gonorrhea,Chamydia and HPV   She was seen in ED 04/15/2021 for Herpes simplex outbreak for the third outbreak in one month.Valtrex 1000 mg tablet to take 1/2 tablet ( 500 mg ) by mouth daily was prescribed.she was advised to follow up with Gynecology. States has x 3 lesion that are very painful. Also hurts whenever she urinates. Currently not sexually active.LMP 05/16/2021.  Denies any denies any fever,chills,nausea,vomiting,abdominal pain,flank pain,urgency,frequency,difficult urination or hematuria.    Also complains of left hand and left foot toes intermittent cramping that occur at least twice per week.no numbness or tingling.No weakness of extremities.States has had symptoms since after having her baby in 2014.Has been trying to see specialist but did not have any PCP.   Due for influenza vaccine,Tetanus  and COVID-19 booster vaccine.declines Influenza vaccine today states plans to go on her vacation in a few weeks does not want to take any vaccine that can make her sick.      Past Medical History:  Diagnosis  Date   Chlamydia 01/18/2017   Gonorrhea 2015 and 2017   HPV in female    Hx of migraines    Past Surgical History:  Procedure Laterality Date   WISDOM TOOTH EXTRACTION      No Known Allergies  Allergies as of 06/05/2021   No Known Allergies      Medication List        Accurate as of June 05, 2021  9:32 AM. If you have any questions, ask your nurse or doctor.          STOP taking these medications    benzonatate 100 MG capsule Commonly known as: Lawyer Stopped by: Caesar Bookman, NP   cephALEXin 500 MG capsule Commonly known as: KEFLEX Stopped by: Caesar Bookman, NP   erythromycin ophthalmic ointment Stopped by: Caesar Bookman, NP   fluticasone 50 MCG/ACT nasal spray Commonly known as: FLONASE Stopped by: Caesar Bookman, NP   Lidocaine 2 % Gel Stopped by: Caesar Bookman, NP   metroNIDAZOLE 500 MG tablet Commonly known as: FLAGYL Stopped by: Caesar Bookman, NP   omeprazole 20 MG capsule Commonly known as: PRILOSEC Stopped by: Caesar Bookman, NP   promethazine-dextromethorphan 6.25-15 MG/5ML syrup Commonly known as: PROMETHAZINE-DM Stopped by: Caesar Bookman, NP   tobramycin 0.3 % ophthalmic solution Commonly known as: Tobrex Stopped by: Caesar Bookman, NP        Review of Systems  Constitutional:  Negative for appetite change, chills, fatigue, fever and unexpected weight change.  HENT:  Negative for congestion, dental  problem, ear discharge, ear pain, facial swelling, hearing loss, nosebleeds, postnasal drip, rhinorrhea, sinus pressure, sinus pain, sneezing, sore throat, tinnitus and trouble swallowing.   Eyes:  Negative for pain, discharge, redness, itching and visual disturbance.  Respiratory:  Negative for cough, chest tightness, shortness of breath and wheezing.   Cardiovascular:  Negative for chest pain, palpitations and leg swelling.  Gastrointestinal:  Negative for abdominal distention, abdominal pain, blood in stool,  constipation, diarrhea, nausea and vomiting.  Endocrine: Negative for cold intolerance, heat intolerance, polydipsia, polyphagia and polyuria.  Genitourinary:  Negative for difficulty urinating, dysuria, flank pain, frequency and urgency.       LMP 05/16/2021    Musculoskeletal:  Negative for arthralgias, back pain, gait problem, joint swelling, myalgias, neck pain and neck stiffness.  Skin:  Negative for color change, pallor, rash and wound.  Neurological:  Negative for dizziness, syncope, speech difficulty, weakness, light-headedness, numbness and headaches.       Cramps on the left hand and left leg   Hematological:  Does not bruise/bleed easily.  Psychiatric/Behavioral:  Negative for agitation, behavioral problems, confusion, hallucinations, self-injury, sleep disturbance and suicidal ideas. The patient is not nervous/anxious.    There is no immunization history for the selected administration types on file for this patient. Pertinent  Health Maintenance Due  Topic Date Due   PAP SMEAR-Modifier  Never done   INFLUENZA VACCINE  Never done   Fall Risk  06/05/2021  Falls in the past year? 0  Number falls in past yr: 0  Injury with Fall? 0  Risk for fall due to : No Fall Risks  Follow up Falls evaluation completed   Functional Status Survey:    Vitals:   06/05/21 0928  BP: 100/70  Pulse: 66  Resp: 18  Temp: 98.4 F (36.9 C)  SpO2: 99%  Weight: 181 lb 6.4 oz (82.3 kg)  Height: 5' 10.5" (1.791 m)   Body mass index is 25.66 kg/m. Physical Exam Vitals reviewed. Exam conducted with a chaperone present (Evie Jones,CMA).  Constitutional:      General: She is not in acute distress.    Appearance: Normal appearance. She is normal weight. She is not ill-appearing or diaphoretic.  HENT:     Head: Normocephalic.     Right Ear: Tympanic membrane, ear canal and external ear normal. There is no impacted cerumen.     Left Ear: Tympanic membrane, ear canal and external ear normal.  There is no impacted cerumen.     Nose: Nose normal. No congestion or rhinorrhea.     Mouth/Throat:     Mouth: Mucous membranes are moist.     Pharynx: Oropharynx is clear. No oropharyngeal exudate or posterior oropharyngeal erythema.  Eyes:     General: No scleral icterus.       Right eye: No discharge.        Left eye: No discharge.     Extraocular Movements: Extraocular movements intact.     Conjunctiva/sclera: Conjunctivae normal.     Pupils: Pupils are equal, round, and reactive to light.  Neck:     Vascular: No carotid bruit.  Cardiovascular:     Rate and Rhythm: Normal rate and regular rhythm.     Pulses: Normal pulses.     Heart sounds: Normal heart sounds. No murmur heard.   No friction rub. No gallop.  Pulmonary:     Effort: Pulmonary effort is normal. No respiratory distress.     Breath sounds: Normal breath sounds. No wheezing,  rhonchi or rales.  Chest:     Chest wall: No tenderness.  Abdominal:     General: Bowel sounds are normal. There is no distension.     Palpations: Abdomen is soft. There is no mass.     Tenderness: There is no abdominal tenderness. There is no right CVA tenderness, left CVA tenderness, guarding or rebound.  Genitourinary:    Exam position: Lithotomy position.     Labia:        Right: Tenderness and lesion present. No rash.        Left: No rash, tenderness or lesion.      Comments: X 3 lesion vesicles with erythema tender to touch on right inner vulva-vaginal  area on 7 O'clock position.Declined vaginal internal exam due to tenderness of lesion  Musculoskeletal:        General: No swelling or tenderness. Normal range of motion.     Cervical back: Normal range of motion. No rigidity or tenderness.     Right lower leg: No edema.     Left lower leg: No edema.  Lymphadenopathy:     Cervical: No cervical adenopathy.     Lower Body: No right inguinal adenopathy. Left inguinal adenopathy present.  Skin:    General: Skin is warm and dry.      Coloration: Skin is not pale.     Findings: No bruising, erythema, lesion or rash.  Neurological:     Mental Status: She is alert and oriented to person, place, and time.     Cranial Nerves: No cranial nerve deficit.     Sensory: No sensory deficit.     Motor: No weakness.     Coordination: Coordination normal.     Gait: Gait normal.  Psychiatric:        Mood and Affect: Mood normal.        Speech: Speech normal.        Behavior: Behavior normal.        Thought Content: Thought content normal.        Judgment: Judgment normal.    Labs reviewed: No results for input(s): NA, K, CL, CO2, GLUCOSE, BUN, CREATININE, CALCIUM, MG, PHOS in the last 8760 hours. No results for input(s): AST, ALT, ALKPHOS, BILITOT, PROT, ALBUMIN in the last 8760 hours. No results for input(s): WBC, NEUTROABS, HGB, HCT, MCV, PLT in the last 8760 hours. No results found for: TSH No results found for: HGBA1C No results found for: CHOL, HDL, LDLCALC, LDLDIRECT, TRIG, CHOLHDL  Significant Diagnostic Results in last 30 days:  No results found.  Assessment/Plan  1. Encounter to establish care No previous immunization records for review. No lab work for review. Recommended to schedule for annual Physical examination and fasting labs.   2. Herpes simplex vulvovaginitis X 3 lesion vesicles with erythema tender to touch on right inner vulva-vaginal  area on 7 O'clock position. Has completed valacyclovir treatment x 3 times over one month given in ED  Request refills to use whenever she has breakout again. SE discussed.will follow up with Gyn and for Pap smear. Will schedule for fasting labs and Physical exam.  - Ambulatory referral to Gynecology - POCT urine pregnancy negative  - valACYclovir (VALTREX) 1000 MG tablet; Take 1 tablet (1,000 mg total) by mouth 3 (three) times daily for 14 days.  Dispense: 21 tablet; Refill: 1  3. Hand cramps Reports ongoing intermittent hand cramping up to 15 seconds since she had  her Epidural injection during delivery in 2014  -  Ambulatory referral to Neurology  4. Foot cramps On going since she received her Epidural - Ambulatory referral to Neurology  5. Overweight with body mass index (BMI) 25.0-29.9 - dietary modification and exercise   6. BMI 25.0-25.9,adult BMI 25.66 Dietary modification and exercise as above   7. Dysuria Afebrile - POCT urine pregnancy negative  - POC Urinalysis Dipstick negative   Family/ staff Communication: Reviewed plan of care with patient verbalized understanding.  Labs/tests ordered:  - POCT urine pregnancy negative  - POC Urinalysis Dipstick negative   Next Appointment : 2 weeks for Annual Physical Examination with fasting labs.  Caesar Bookman, NP

## 2021-06-07 ENCOUNTER — Encounter: Payer: Self-pay | Admitting: Neurology

## 2021-06-23 ENCOUNTER — Telehealth: Payer: Self-pay

## 2021-06-23 NOTE — Telephone Encounter (Signed)
Patient called and request to speak to PCP Ngetich, Donalee Citrin, NP . I was informed of this information by front administration Kim.S. She states that all Medical Assistants were gone to lunch and I had left for the day. I let her know that this message should of been sent to Medical Assistant working with PCP Ngetich, Dinah C, NP at the time in order to get back to patient in timely manner. I called patient and no answer. I was unable to leave patient a voicemail on voicemail box either. I will attempt another call later. I'm unsure if patient left a voicemail on Clinical Intake phone

## 2021-06-29 ENCOUNTER — Telehealth: Payer: Self-pay

## 2021-06-29 NOTE — Telephone Encounter (Signed)
Message left on clinical intake voicemail:   Incoming call received from patient stating she needs another medication as her current medication is not working.   I reviewed medication list and no medication are listed for patient. Called patient back, unable to leave a message on voicemail due to voicemail full. I will try to reach patient again.

## 2021-07-11 ENCOUNTER — Ambulatory Visit: Payer: Medicaid Other | Admitting: Family

## 2021-07-17 ENCOUNTER — Ambulatory Visit: Payer: Medicaid Other | Admitting: Family

## 2021-07-28 NOTE — Telephone Encounter (Signed)
Called patient and unable to leave voicemail.  

## 2021-08-10 NOTE — Telephone Encounter (Signed)
Noted  

## 2021-08-10 NOTE — Telephone Encounter (Signed)
Called patient again and she answered the phone. Patient states that she didn't know that we called in the past. Patient states that her question was if medication could be changed to something else. Patient currently takes "Valacyclovir 1000mg  one tablet daily". Patient states that she takes this medication as directed but she still has outbreaks. Patient states that she also needs to be seen because she ran out of medication "Valacyclovir". Patient scheduled to see PCP Ngetich, , NP tomorrow 08/11/2021 at 10am to discuss further options. Message routed to PCP Ngetich, 08/13/2021, NP .

## 2021-08-11 ENCOUNTER — Ambulatory Visit: Payer: Medicaid Other | Admitting: Family

## 2021-08-15 ENCOUNTER — Other Ambulatory Visit: Payer: Self-pay

## 2021-08-15 ENCOUNTER — Encounter: Payer: Self-pay | Admitting: Family

## 2021-08-15 ENCOUNTER — Ambulatory Visit (INDEPENDENT_AMBULATORY_CARE_PROVIDER_SITE_OTHER): Payer: Medicaid Other | Admitting: Family

## 2021-08-15 VITALS — BP 110/80 | HR 67 | Temp 97.7°F | Resp 16 | Ht 70.5 in | Wt 172.8 lb

## 2021-08-15 DIAGNOSIS — Z20822 Contact with and (suspected) exposure to covid-19: Secondary | ICD-10-CM | POA: Diagnosis not present

## 2021-08-15 DIAGNOSIS — R634 Abnormal weight loss: Secondary | ICD-10-CM | POA: Diagnosis not present

## 2021-08-15 DIAGNOSIS — A6004 Herpesviral vulvovaginitis: Secondary | ICD-10-CM

## 2021-08-15 DIAGNOSIS — F320 Major depressive disorder, single episode, mild: Secondary | ICD-10-CM

## 2021-08-15 DIAGNOSIS — R630 Anorexia: Secondary | ICD-10-CM

## 2021-08-15 LAB — POCT URINE PREGNANCY: Preg Test, Ur: NEGATIVE

## 2021-08-15 MED ORDER — CITALOPRAM HYDROBROMIDE 10 MG PO TABS: 10.0000 mg | ORAL_TABLET | Freq: Every day | ORAL | 3 refills | Status: DC

## 2021-08-15 MED ORDER — VALACYCLOVIR HCL 1 G PO TABS: 1000.0000 mg | ORAL_TABLET | Freq: Every day | ORAL | 0 refills | Status: AC

## 2021-08-15 NOTE — Patient Instructions (Signed)
Citalopram Tablets What is this medication? CITALOPRAM (sye TAL oh pram) treats depression. It increases the amount of serotonin in the brain, a hormone that helps regulate mood. It belongs to a group of medications called SSRIs. This medicine may be used for other purposes; ask your health care provider or pharmacist if you have questions. COMMON BRAND NAME(S): Celexa What should I tell my care team before I take this medication? They need to know if you have any of these conditions: Bipolar disorder or a family history of bipolar disorder Bleeding disorders Glaucoma Heart disease History of irregular heartbeat Kidney disease Liver disease Low levels of magnesium or potassium in the blood Receiving electroconvulsive therapy Seizures Suicidal thoughts, plans, or attempt; a previous suicide attempt by you or a family member Take medications that treat or prevent blood clots Thyroid disease An unusual or allergic reaction to citalopram, escitalopram, other medications, foods, dyes, or preservatives Pregnant or trying to become pregnant Breast-feeding How should I use this medication? Take this medication by mouth with a glass of water. Follow the directions on the prescription label. You can take it with or without food. Take your medication at regular intervals. Do not take your medication more often than directed. Do not stop taking this medication suddenly except upon the advice of your care team. Stopping this medication too quickly may cause serious side effects or your condition may worsen. A special MedGuide will be given to you by the pharmacist with each prescription and refill. Be sure to read this information carefully each time. Talk to your care team about the use of this medication in children. Special care may be needed. Patients over 17 years old may have a stronger reaction and need a smaller dose. Overdosage: If you think you have taken too much of this medicine contact a  poison control center or emergency room at once. NOTE: This medicine is only for you. Do not share this medicine with others. What if I miss a dose? If you miss a dose, take it as soon as you can. If it is almost time for your next dose, take only that dose. Do not take double or extra doses. What may interact with this medication? Do not take this medication with any of the following: Certain medications for fungal infections like fluconazole, itraconazole, ketoconazole, posaconazole, voriconazole Cisapride Dronedarone Escitalopram Linezolid MAOIs like Carbex, Eldepryl, Marplan, Nardil, and Parnate Methylene blue (injected into a vein) Pimozide Thioridazine This medication may also interact with the following: Alcohol Amphetamines Aspirin and aspirin-like medications Carbamazepine Certain medications for depression, anxiety, or psychotic disturbances Certain medications for infections like chloroquine, clarithromycin, erythromycin, furazolidone, isoniazid, pentamidine Certain medications for migraine headaches like almotriptan, eletriptan, frovatriptan, naratriptan, rizatriptan, sumatriptan, zolmitriptan Certain medications for sleep Certain medications that treat or prevent blood clots like dalteparin, enoxaparin, warfarin Cimetidine Diuretics Dofetilide Fentanyl Lithium Methadone Metoprolol NSAIDs, medications for pain and inflammation, like ibuprofen or naproxen Omeprazole Other medications that prolong the QT interval (cause an abnormal heart rhythm) Procarbazine Rasagiline Supplements like St. John's wort, kava kava, valerian Tramadol Tryptophan Ziprasidone This list may not describe all possible interactions. Give your health care provider a list of all the medicines, herbs, non-prescription drugs, or dietary supplements you use. Also tell them if you smoke, drink alcohol, or use illegal drugs. Some items may interact with your medicine. What should I watch for while  using this medication? Tell your care team if your symptoms do not get better or if they get worse. Visit your  care team for regular checks on your progress. Because it may take several weeks to see the full effects of this medication, it is important to continue your treatment as prescribed. Watch for new or worsening thoughts of suicide or depression. This includes sudden changes in mood, behavior, or thoughts. These changes can happen at any time but are more common in the beginning of treatment or after a change in dose. Call your care team right away if you experience these thoughts or worsening depression. Manic episodes may happen in patients with bipolar disorder who take this medication. Watch for changes in feelings or behaviors such as feeling anxious, nervous, agitated, panicky, irritable, hostile, aggressive, impulsive, severely restless, overly excited and hyperactive, or trouble sleeping. These symptoms can happen at anytime but are more common in the beginning of treatment or after a change in dose. Call you care team right away if you notice any of these symptoms. You may get drowsy or dizzy. Do not drive, use machinery, or do anything that needs mental alertness until you know how this medication affects you. Do not stand or sit up quickly, especially if you are an older patient. This reduces the risk of dizzy or fainting spells. Alcohol may interfere with the effect of this medication. Avoid alcoholic drinks. Your mouth may get dry. Chewing sugarless gum or sucking hard candy, and drinking plenty of water may help. Contact your care team if the problem does not go away or is severe. What side effects may I notice from receiving this medication? Side effects that you should report to your care team as soon as possible: Allergic reactions--skin rash, itching, hives, swelling of the face, lips, tongue, or throat Bleeding--bloody or black, tar-like stools, red or dark brown urine, vomiting  blood or brown material that looks like coffee grounds, small, red or purple spots on skin, unusual bleeding or bruising Heart rhythm changes--fast or irregular heartbeat, dizziness, feeling faint or lightheaded, chest pain, trouble breathing Low sodium level--muscle weakness, fatigue, dizziness, headache, confusion Serotonin syndrome--irritability, confusion, fast or irregular heartbeat, muscle stiffness, twitching muscles, sweating, high fever, seizure, chills, vomiting, diarrhea Sudden eye pain or change in vision such as blurry vision, seeing halos around lights, vision loss Thoughts of suicide or self-harm, worsening mood, feelings of depression Side effects that usually do not require medical attention (report to your care team if they continue or are bothersome): Change in sex drive or performance Diarrhea Dry mouth Excessive sweating Nausea Tremors or shaking Upset stomach This list may not describe all possible side effects. Call your doctor for medical advice about side effects. You may report side effects to FDA at 1-800-FDA-1088. Where should I keep my medication? Keep out of reach of children and pets. Store at room temperature between 15 and 30 degrees C (59 and 86 degrees F). Throw away any unused medication after the expiration date. NOTE: This sheet is a summary. It may not cover all possible information. If you have questions about this medicine, talk to your doctor, pharmacist, or health care provider.  2022 Elsevier/Gold Standard (2020-08-01 00:00:00)

## 2021-08-15 NOTE — Progress Notes (Signed)
Provider: Marlowe Sax FNP-C  Alayasia Breeding, Nelda Bucks, NP  Patient Care Team: Loralie Malta, Nelda Bucks, NP as PCP - General (Family Medicine)  Extended Emergency Contact Information Primary Emergency Contact: [name],April Address: [address]          [place], [place] [address] Johnnette Litter of [place] Phone: [phone] Mobile Phone: [phone] Relation: Mother  Code Status:  Full Code  Goals of care: Advanced Directive information Advanced Directives 08/15/2021  Does Patient Have a Medical Advance Directive? No  Would patient like information on creating a medical advance directive? No - Patient declined     Chief Complaint  Patient presents with   Acute Visit    Patient complains of headache from Covid exposure, and needing different HSV prescription.     HPI:  Pt is a 26 y.o. female seen today for an acute visit for evaluation of headache from exposure to COVID-19 at work one week ago.states did not have a a mask had to give report to one of her evening shift Co-work met with another one as she was walking out.both Co-workers tested positive.She denies any fever,chills,cough,fatigue,body aches,runny nose,chest tightness,chest pain,palpitation or shortness of breath. States has been loosing weight.eats two meals then snacks throughout the day.Has no appetite.feels depressed.sometimes finds herself crying without any reason. No thoughts of SI  Also request refills for Valcyclovir states would like to have at hand since has been breakout every now and then.wonders whether she can switch to Acyclovir instead.Made aware Acyclovir will be multiple twice daily.Valacyclovir broken down to acyclovir in the body.will continue with current valacyclovir for now.      Past Medical History:  Diagnosis Date   Chlamydia 01/18/2017   Gonorrhea 2015 and 2017   HPV in female    Hx of migraines    Past Surgical History:  Procedure Laterality Date   WISDOM TOOTH EXTRACTION      No Known  Allergies  Outpatient Encounter Medications as of 08/15/2021  Medication Sig   [DISCONTINUED] ferrous sulfate 325 (65 FE) MG tablet Take 1 tablet (325 mg total) by mouth 3 (three) times daily with meals.   [DISCONTINUED] valACYclovir (VALTREX) 1000 MG tablet Take 1 tablet by mouth daily.   No facility-administered encounter medications on file as of 08/15/2021.    Review of Systems  Constitutional:  Positive for unexpected weight change. Negative for appetite change, chills, fatigue and fever.       Weight loss   HENT:  Negative for congestion, dental problem, ear discharge, ear pain, facial swelling, hearing loss, nosebleeds, postnasal drip, rhinorrhea, sinus pressure, sinus pain, sneezing, sore throat, tinnitus and trouble swallowing.   Eyes:  Negative for pain, discharge, redness, itching and visual disturbance.  Respiratory:  Negative for cough, chest tightness, shortness of breath and wheezing.   Cardiovascular:  Negative for chest pain, palpitations and leg swelling.  Gastrointestinal:  Negative for abdominal distention, abdominal pain, blood in stool, constipation, diarrhea, nausea and vomiting.  Endocrine: Negative for cold intolerance, heat intolerance, polydipsia, polyphagia and polyuria.  Genitourinary:        LMP  07/26/2021   Musculoskeletal:  Negative for arthralgias, back pain, gait problem, joint swelling, myalgias, neck pain and neck stiffness.  Skin:  Negative for color change, pallor and rash.  Neurological:  Negative for dizziness, weakness, light-headedness, numbness and headaches.  Psychiatric/Behavioral:  Negative for agitation, behavioral problems, confusion, hallucinations, self-injury, sleep disturbance and suicidal ideas. The patient is not nervous/anxious.        Depressed  Immunization History  Administered Date(s) Administered   DTaP 06/22/1997, 03/26/2002, 05/28/2002   Hepatitis A 04/01/2007   Hepatitis B 1995/09/27, 06/22/1997, 03/26/2002   HiB (PRP-OMP)  06/22/1997   Moderna Sars-Covid-2 Vaccination 07/12/2020, 08/10/2020   Tdap 12/27/2006   Varicella 05/28/2002, 04/01/2007   Pertinent  Health Maintenance Due  Topic Date Due   PAP SMEAR-Modifier  Never done   INFLUENZA VACCINE  Never done   Fall Risk 02/21/2021 03/08/2021 04/15/2021 06/05/2021 08/15/2021  Falls in the past year? - - - 0 0  Was there an injury with Fall? - - - 0 0  Fall Risk Category Calculator - - - 0 0  Fall Risk Category - - - Low Low  Patient Fall Risk Level _0   Patient at Risk for Falls Due to - - - No Fall Risks No Fall Risks  Fall risk Follow up - - - Falls evaluation completed Falls evaluation completed   Functional Status Survey:    Vitals:   08/15/21 1118  BP: 110/80  Pulse: 67  Resp: 16  Temp: 97.7 F (36.5 C)  SpO2: 96%  Weight: 172 lb 12.8 oz (78.4 kg)  Height: 5' 10.5" (1.791 m)   Body mass index is 24.44 kg/m. Physical Exam Vitals reviewed.  Constitutional:      General: She is not in acute distress.    Appearance: Normal appearance. She is normal weight. She is not ill-appearing or diaphoretic.  HENT:     Head: Normocephalic.     Right Ear: Tympanic membrane, ear canal and external ear normal. There is no impacted cerumen.     Left Ear: Tympanic membrane, ear canal and external ear normal. There is no impacted cerumen.     Nose: Nose normal. No congestion or rhinorrhea.     Mouth/Throat:     Mouth: Mucous membranes are moist.     Pharynx: Oropharynx is clear. No oropharyngeal exudate or posterior oropharyngeal erythema.  Eyes:     General: No scleral icterus.       Right eye: No discharge.        Left eye: No discharge.     Extraocular Movements: Extraocular movements intact.     Conjunctiva/sclera: Conjunctivae normal.     Pupils: Pupils are equal, round, and reactive to light.  Neck:     Vascular: No carotid bruit.  Cardiovascular:     Rate and Rhythm: Normal rate  and regular rhythm.     Pulses: Normal pulses.     Heart sounds: Normal heart sounds. No murmur heard.   No friction rub. No gallop.  Pulmonary:     Effort: Pulmonary effort is normal. No respiratory distress.     Breath sounds: Normal breath sounds. No wheezing, rhonchi or rales.  Chest:     Chest wall: No tenderness.  Abdominal:     General: Bowel sounds are normal. There is no distension.     Palpations: Abdomen is soft. There is no mass.     Tenderness: There is no abdominal tenderness. There is no right CVA tenderness, left CVA tenderness, guarding or rebound.  Musculoskeletal:        General: No swelling or tenderness. Normal range of motion.     Cervical back: Normal range of motion. No rigidity or tenderness.     Right lower leg: No edema.     Left lower leg: No edema.  Lymphadenopathy:  Cervical: No cervical adenopathy.  Skin:    General: Skin is warm and dry.     Coloration: Skin is not pale.     Findings: No bruising, erythema, lesion or rash.  Neurological:     Mental Status: She is alert and oriented to person, place, and time.     Cranial Nerves: No cranial nerve deficit.     Sensory: No sensory deficit.     Motor: No weakness.     Coordination: Coordination normal.     Gait: Gait normal.  Psychiatric:        Mood and Affect: Mood normal.        Speech: Speech normal.        Behavior: Behavior normal.        Thought Content: Thought content normal.        Judgment: Judgment normal.    Labs reviewed: No results for input(s): NA, K, CL, CO2, GLUCOSE, BUN, CREATININE, CALCIUM, MG, PHOS in the last 8760 hours. No results for input(s): AST, ALT, ALKPHOS, BILITOT, PROT, ALBUMIN in the last 8760 hours. No results for input(s): WBC, NEUTROABS, HGB, HCT, MCV, PLT in the last 8760 hours. No results found for: TSH No results found for: HGBA1C No results found for: CHOL, HDL, LDLCALC, LDLDIRECT, TRIG, CHOLHDL  Significant Diagnostic Results in last 30 days:  No  results found.  Assessment/Plan  1. Exposure to COVID-19 virus Afebrile  Asymptomatic  - SARS-COV-2 RNA,(COVID-19) QUAL NAAT - POCT urine pregnancy negative   2. 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   - valACYclovir (VALTREX) 1000 MG tablet; Take 1 tablet (1,000 mg total) by mouth daily.  Dispense: 90 tablet; Refill: 0 - POCT urine pregnancy  3. Weight loss Reports progressive weight loss suspect due to not eating or skipping meals.depression could be a contributory factor. Will check TSH to evaluate for thyroid abnormalities.  - TSH - POCT urine pregnancy  4. Decreased appetite TSH as above  - POCT urine pregnancy  5. Current mild episode of major depressive disorder without prior episode ([place]) Worsening symptoms decreased oral intake,crying for no reason.  - citalopram (CELEXA) 10 MG tablet; Take 1 tablet (10 mg total) by mouth daily.  Dispense: 30 tablet; Refill: 3 - POCT urine pregnancy  Family/ staff Communication: Reviewed plan of care with patient  verbalized understanding   Labs/tests ordered:  TSH as above  - POCT urine pregnancy - SARS-COV-2 RNA,(COVID-19) QUAL NAAT  Next Appointment: one month for follow up depression   Sandrea Hughs, NP

## 2021-08-16 LAB — TSH: TSH: 3.61 mIU/L

## 2021-08-16 LAB — SARS-COV-2 RNA,(COVID-19) QUALITATIVE NAAT: SARS CoV2 RNA: NOT DETECTED

## 2021-08-24 NOTE — Progress Notes (Deleted)
[facility] Clinic Note - Initial Visit   Date: 08/24/21  Alexandra Lowery MRN: [id number] DOB: [birth year]   Dear Dr Kelby Fam, Nelda Bucks, NP:  Thank you for your kind referral of Alexandra Lowery for consultation of ***. Although her history is well known to you, please allow [place] to reiterate it for the purpose of our medical record. The patient was accompanied to the clinic by *** who also provides collateral information.     History of Present Illness: Alexandra Lowery is a 26 y.o. ***-handed female with history of STD and anxiety presenting for evaluation of muscle cramps.   Left hand and foot cramps  Out-side paper records, electronic medical record, and images have been reviewed where available and summarized as: *** No results found for: HGBA1C No results found for: VITAMINB12 Lab Results  Component Value Date   TSH 3.61 08/15/2021   No results found for: Micheline Rough  Past Medical History:  Diagnosis Date   Chlamydia 01/18/2017   Gonorrhea 2015 and 2017   HPV in female    Hx of migraines     Past Surgical History:  Procedure Laterality Date   WISDOM TOOTH EXTRACTION       Medications:  Outpatient Encounter Medications as of 08/25/2021  Medication Sig   citalopram (CELEXA) 10 MG tablet Take 1 tablet (10 mg total) by mouth daily.   valACYclovir (VALTREX) 1000 MG tablet Take 1 tablet (1,000 mg total) by mouth daily.   [DISCONTINUED] ferrous sulfate 325 (65 FE) MG tablet Take 1 tablet (325 mg total) by mouth 3 (three) times daily with meals.   No facility-administered encounter medications on file as of 08/25/2021.    Allergies: No Known Allergies  Family History: Family History  Problem Relation Age of Onset   Healthy Mother    Healthy Father    Hypertension Maternal Grandmother     Social History: Social History   Tobacco Use   Smoking status: Never   Smokeless tobacco: Never  Vaping Use   Vaping  Use: Every day  Substance Use Topics   Alcohol use: Yes    Comment: Social   Drug use: No   Social History   Social History Narrative   Tobacco use, amount per day now:   Past tobacco use, amount per day:   How many years did you use tobacco:   Alcohol use (drinks per week):   Diet:   Do you drink/eat things with caffeine:   Marital status:   Single                               What year were you married?   Do you live in a house, apartment, assisted living, condo, trailer, etc.? House   Is it one or more stories?   How many persons live in your home? 7   Do you have pets in your home?( please list) No   Highest Level of education completed? 12th Grade   Current or past profession:   Do you exercise? No                                 Type and how often?   Do you have a living will? No   Do you have a DNR form?    No  If not, do you want to discuss one?   Do you have signed POA/HPOA forms?  No                      If so, please bring to you appointment      Do you have any difficulty bathing or dressing yourself? No    Do you have any difficulty preparing food or eating? No   Do you have any difficulty managing your medications? No   Do you have any difficulty managing your finances? No   Do you have any difficulty affording your medications? No    Vital Signs:  LMP 07/26/2021    General Medical Exam:  *** General:  Well appearing, comfortable.   Eyes/ENT: see cranial nerve examination.   Neck:   No carotid bruits. Respiratory:  Clear to auscultation, good air entry bilaterally.   Cardiac:  Regular rate and rhythm, no murmur.   Extremities:  No deformities, edema, or skin discoloration.  Skin:  No rashes or lesions.  Neurological Exam: MENTAL STATUS including orientation to time, place, person, recent and remote memory, attention span and concentration, language, and fund of knowledge is ***normal.  Speech is not dysarthric.  CRANIAL  NERVES: II:  No visual field defects.  Unremarkable fundi.   III-IV-VI: Pupils equal round and reactive to light.  Normal conjugate, extra-ocular eye movements in all directions of gaze.  No nystagmus.  No ptosis***.   V:  Normal facial sensation.    VII:  Normal facial symmetry and movements.   VIII:  Normal hearing and vestibular function.   IX-X:  Normal palatal movement.   XI:  Normal shoulder shrug and head rotation.   XII:  Normal tongue strength and range of motion, no deviation or fasciculation.  MOTOR:  No atrophy, fasciculations or abnormal movements.  No pronator drift.   Upper Extremity:  Right  Left  Deltoid  5/5   5/5   Biceps  5/5   5/5   Triceps  5/5   5/5   Infraspinatus 5/5  5/5  Medial pectoralis 5/5  5/5  Wrist extensors  5/5   5/5   Wrist flexors  5/5   5/5   Finger extensors  5/5   5/5   Finger flexors  5/5   5/5   Dorsal interossei  5/5   5/5   Abductor pollicis  5/5   5/5   Tone (Ashworth scale)  0  0   Lower Extremity:  Right  Left  Hip flexors  5/5   5/5   Hip extensors  5/5   5/5   Adductor 5/5  5/5  Abductor 5/5  5/5  Knee flexors  5/5   5/5   Knee extensors  5/5   5/5   Dorsiflexors  5/5   5/5   Plantarflexors  5/5   5/5   Toe extensors  5/5   5/5   Toe flexors  5/5   5/5   Tone (Ashworth scale)  0  0   MSRs:  Right        Left                  brachioradialis 2+  2+  biceps 2+  2+  triceps 2+  2+  patellar 2+  2+  ankle jerk 2+  2+  Hoffman no  no  plantar response down  down   SENSORY:  Normal and symmetric perception of light touch, pinprick,  vibration, and proprioception.  Romberg's sign absent.   COORDINATION/GAIT: Normal finger-to- nose-finger and heel-to-shin.  Intact rapid alternating movements bilaterally.  Able to rise from a chair without using arms.  Gait narrow based and stable. Tandem and stressed gait intact.    IMPRESSION: ***  PLAN/RECOMMENDATIONS:  *** Return to clinic in *** months.  Total time spent:  ***   Thank you for allowing me to participate in patient's care.  If I can answer any additional questions, I would be pleased to do so.    Sincerely,    Naidelyn Parrella K. [name] Pronto, DO

## 2021-08-25 ENCOUNTER — Ambulatory Visit: Payer: Medicaid Other | Admitting: Neurology

## 2021-10-05 ENCOUNTER — Encounter: Payer: Medicaid Other | Admitting: Family

## 2021-10-08 NOTE — Progress Notes (Signed)
  This encounter was created in error - please disregard. No show 

## 2021-10-30 ENCOUNTER — Ambulatory Visit: Payer: Medicaid Other | Admitting: Neurology

## 2021-10-31 ENCOUNTER — Telehealth: Payer: Self-pay

## 2021-10-31 DIAGNOSIS — R252 Cramp and spasm: Secondary | ICD-10-CM

## 2021-10-31 NOTE — Telephone Encounter (Signed)
Incoming call received from patient stating she was referred to neurology 4 months ago and finally had the appointment yesterday. Patient states she arrived 10 minutes late and they refused to see her. Patient states she called today and the receptionist at the neurology office was rude and stated they will not be able to reschedule her. ? ?Patient is asking if we can call Neurology on her behalf to see if they will schedule her or send her to a different neurology because she really needs to see one.  ? ?I called [facility] and left a message to inquire about this incident and requested a return call. ? ?I will forward to Ngetich, Dinah C, NP as a FYI while I await a return call  ?

## 2021-10-31 NOTE — Telephone Encounter (Signed)
Noted.will send another referral if indicated.  ?

## 2021-11-01 NOTE — Telephone Encounter (Signed)
Christina with [facility] called back stating she spoke with patient earlier yesterday and she is surprised that patient said she was rude as she probably did not like what they had to say in reference to enforcing late arrival and no show policy. ? ?Per Trula Ore patient arrived 10 min late to this time, no showed in January 2023, and per the physicians, patient can not be rescheduled. Trula Ore hates that the patient is experiencing this however the policy is discussed via the new patient packet that patient received. ? ?Dinah please place another referral for a different location ?

## 2021-11-01 NOTE — Telephone Encounter (Signed)
Neurology referral reordered

## 2021-12-04 ENCOUNTER — Telehealth: Payer: Self-pay | Admitting: *Deleted

## 2021-12-04 MED ORDER — VALACYCLOVIR HCL 1 G PO TABS: 1000.0000 mg | ORAL_TABLET | Freq: Every day | ORAL | 0 refills | Status: DC

## 2021-12-04 NOTE — Telephone Encounter (Signed)
May refill valtrex 1000 mg tablet one by mouth daily # 30  ?Patient need to schedule for her Routine follow up appointment in June,2023.   ?

## 2021-12-04 NOTE — Telephone Encounter (Signed)
Patient notified and agreed and scheduled an appointment for 01/24/2022 with [place].  ?Rx sent to pharmacy.  ?

## 2021-12-04 NOTE — Telephone Encounter (Signed)
Patient called and stated that she is having an outbreak and request a Rx for her Valtrex to be sent to pharmacy. Not in current medication list.  ? ?Please Advise.  ?

## 2021-12-11 ENCOUNTER — Telehealth: Payer: Self-pay

## 2021-12-11 NOTE — Telephone Encounter (Signed)
Message left on clinical intake voicemail:  ? ?Patient called stating she call on the 26th requesting a refill on Valtrex and as of today the pharmacy has not heard anything from [place] and she really needs this medication. ? ? ?I check chart and it appears patient called on the 24th and rx was sent to [facility]. ? ?I called that patient and left a message informing her rx was sent as she requested on the 24th. Below are the details from the rx  ? ? ? ?valACYclovir (VALTREX) 1000 MG tablet 30 tablet 0 12/04/2021    ?Sig - Route: Take 1 tablet (1,000 mg total) by mouth daily. - Oral   ?Sent to pharmacy as: valACYclovir (VALTREX) 1000 MG tablet   ?E-Prescribing Status: Receipt confirmed by pharmacy (12/04/2021  4:42 PM EDT)   ? ? ?I called [facility] and spoke with Maralyn Sago who confirmed that rx was received on 12/04/21. Maralyn Sago states rx was on file and she will prepare medication and get it ready ?

## 2022-01-23 ENCOUNTER — Encounter: Payer: Self-pay | Admitting: Family

## 2022-01-24 ENCOUNTER — Encounter: Payer: Medicaid Other | Admitting: Family

## 2022-01-24 NOTE — Progress Notes (Signed)
  This encounter was created in error - please disregard. No show 

## 2022-01-25 ENCOUNTER — Encounter: Payer: Self-pay | Admitting: Nurse Practitioner

## 2022-01-25 ENCOUNTER — Ambulatory Visit (INDEPENDENT_AMBULATORY_CARE_PROVIDER_SITE_OTHER): Payer: Medicaid Other | Admitting: Nurse Practitioner

## 2022-01-25 VITALS — BP 110/70 | HR 85 | Temp 98.0°F | Resp 16 | Ht 70.5 in | Wt 165.2 lb

## 2022-01-25 DIAGNOSIS — D649 Anemia, unspecified: Secondary | ICD-10-CM | POA: Diagnosis not present

## 2022-01-25 DIAGNOSIS — B369 Superficial mycosis, unspecified: Secondary | ICD-10-CM | POA: Diagnosis not present

## 2022-01-25 DIAGNOSIS — N926 Irregular menstruation, unspecified: Secondary | ICD-10-CM

## 2022-01-25 DIAGNOSIS — A6004 Herpesviral vulvovaginitis: Secondary | ICD-10-CM

## 2022-01-25 MED ORDER — VALACYCLOVIR HCL 1 G PO TABS: ORAL_TABLET | ORAL | 0 refills | Status: DC

## 2022-01-25 MED ORDER — VALACYCLOVIR HCL 1 G PO TABS: 1000.0000 mg | ORAL_TABLET | Freq: Every day | ORAL | 0 refills | Status: DC

## 2022-01-25 MED ORDER — TERBINAFINE HCL 250 MG PO TABS: 250.0000 mg | ORAL_TABLET | Freq: Every day | ORAL | 0 refills | Status: DC

## 2022-01-25 NOTE — Patient Instructions (Addendum)
Continue valacyclovir daily for HSV, if you get a flare increase to twice daily for 7 days   To start terbinafine 250 mg by mouth daily for fungal infection- to follow up in 2 weeks to make sure this has cleared Do not drink alcohol while on medication

## 2022-01-25 NOTE — Progress Notes (Addendum)
Careteam: Patient Care Team: Ngetich, Nelda Bucks, NP as PCP - General (Family Medicine)  PLACE OF SERVICE:  [place] Directive information Does Patient Have a Medical Advance Directive?: No, Would patient like information on creating a medical advance directive?: No - Patient declined  No Known Allergies  Chief Complaint  Patient presents with   Acute Visit    Patient has sores all over her legs, back, and stomach. Needs someone to check areas. Patient states this has been ongoing 1 week and 1/2.     HPI: Patient is a 26 y.o. female due to sores and rashes. Has notices them for about 1.5 weeks. Very itchy and has spread throughout her body.  No pain or drainage.  Reports she is constantly itching her body. Started on her feet, went to her legs, stomach, arms and back.    She is taking valtrex 1000 mg daily- reports she was on acyclovir and felt like this was better for her.   Review of Systems:  Review of Systems  Constitutional:  Negative for chills and fever.  Skin:  Positive for itching and rash.    Past Medical History:  Diagnosis Date   Chlamydia 01/18/2017   Gonorrhea 2015 and 2017   HPV in female    Hx of migraines    Past Surgical History:  Procedure Laterality Date   WISDOM TOOTH EXTRACTION     Social History:   reports that she has never smoked. She has never used smokeless tobacco. She reports current alcohol use. She reports that she does not use drugs.  Family History  Problem Relation Age of Onset   Healthy Mother    Healthy Father    Hypertension Maternal Grandmother     Medications: Patient's Medications  New Prescriptions   No medications on file  Previous Medications   No medications on file  Modified Medications   No medications on file  Discontinued Medications   CITALOPRAM (CELEXA) 10 MG TABLET    Take 1 tablet (10 mg total) by mouth daily.   VALACYCLOVIR (VALTREX) 1000 MG TABLET    Take 1 tablet (1,000 mg total) by  mouth daily.    Physical Exam:  Vitals:   01/25/22 1337  BP: 110/70  Pulse: 85  Resp: 16  Temp: 98 F (36.7 C)  SpO2: 99%  Weight: 165 lb 3.2 oz (74.9 kg)  Height: 5' 10.5" (1.791 m)   Body mass index is 23.37 kg/m. Wt Readings from Last 3 Encounters:  01/25/22 165 lb 3.2 oz (74.9 kg)  08/15/21 172 lb 12.8 oz (78.4 kg)  06/05/21 181 lb 6.4 oz (82.3 kg)    Physical Exam Constitutional:      General: She is not in acute distress.    Appearance: She is well-developed. She is not diaphoretic.  HENT:     Head: Normocephalic and atraumatic.     Mouth/Throat:     Pharynx: No oropharyngeal exudate.  Eyes:     Conjunctiva/sclera: Conjunctivae normal.     Pupils: Pupils are equal, round, and reactive to light.  Cardiovascular:     Rate and Rhythm: Normal rate and regular rhythm.     Heart sounds: Normal heart sounds.  Pulmonary:     Effort: Pulmonary effort is normal.     Breath sounds: Normal breath sounds.  Abdominal:     General: Bowel sounds are normal.     Palpations: Abdomen is soft.  Musculoskeletal:     Cervical back: Normal  range of motion and neck supple.     Right lower leg: No edema.     Left lower leg: No edema.  Skin:    General: Skin is warm and dry.     Findings: Rash (scattered raised small irregular patches to foot, leg, abdomen, back and arms) present.  Neurological:     Mental Status: She is alert.  Psychiatric:        Mood and Affect: Mood normal.     Labs reviewed: Basic Metabolic Panel: Recent Labs    08/15/21 1216  TSH 3.61   Liver Function Tests: No results for input(s): "AST", "ALT", "ALKPHOS", "BILITOT", "PROT", "ALBUMIN" in the last 8760 hours. No results for input(s): "LIPASE", "AMYLASE" in the last 8760 hours. No results for input(s): "AMMONIA" in the last 8760 hours. CBC: No results for input(s): "WBC", "NEUTROABS", "HGB", "HCT", "MCV", "PLT" in the last 8760 hours. Lipid Panel: No results for input(s): "CHOL", "HDL",  "LDLCALC", "TRIG", "CHOLHDL", "LDLDIRECT" in the last 8760 hours. TSH: Recent Labs    08/15/21 1216  TSH 3.61   A1C: No results found for: "HGBA1C"   Assessment/Plan 1. Fungal infection of skin Due to the diffuse nature of the tinea corporis will treat with oral medication.  - terbinafine (LAMISIL) 250 MG tablet; Take 1 tablet (250 mg total) by mouth daily.  Dispense: 14 tablet; Refill: 0 - CMP with eGFR([facility]) - CBC with Differential/Platelet  2. Herpes simplex vulvovaginitis - valACYclovir (VALTREX) 1000 MG tablet; Daily by mouth for suppression, for flare increase to twice daily for 7 days.  Dispense: 90 tablet; Refill: 0   Return in about 2 weeks (around 02/08/2022). Carlos American. [place], [facility] [phone]    Addendum to note; labs came back with critical hgb, pt was called and reports she has abnormal menses lasting 13-14 days, last was 12/26/21 and now she is current one menses which started 01/21/22.  Notes that she has had chronic chest pain and gone to the ED several times to have this worked up but with negative findings. She does note that she is having shortness of breath today and some headaches.   3. Abnormal menses -likely causing anemia.  - Ambulatory referral to Gynecology  4. Symptomatic anemia Educated to go to the ED at this time, she is in agreement  - Ambulatory referral to Gynecology

## 2022-01-26 ENCOUNTER — Telehealth: Payer: Self-pay

## 2022-01-26 ENCOUNTER — Telehealth: Payer: Self-pay | Admitting: Nurse Practitioner

## 2022-01-26 LAB — COMPLETE METABOLIC PANEL WITH GFR
AG Ratio: 1.3 (calc) (ref 1.0–2.5)
ALT: 9 U/L (ref 6–29)
AST: 15 U/L (ref 10–30)
Albumin: 4.1 g/dL (ref 3.6–5.1)
Alkaline phosphatase (APISO): 51 U/L (ref 31–125)
BUN: 7 mg/dL (ref 7–25)
CO2: 23 mmol/L (ref 20–32)
Calcium: 9 mg/dL (ref 8.6–10.2)
Chloride: 108 mmol/L (ref 98–110)
Creat: 0.62 mg/dL (ref 0.50–0.96)
Globulin: 3.1 g/dL (calc) (ref 1.9–3.7)
Glucose, Bld: 81 mg/dL (ref 65–139)
Potassium: 4.1 mmol/L (ref 3.5–5.3)
Sodium: 138 mmol/L (ref 135–146)
Total Bilirubin: 0.3 mg/dL (ref 0.2–1.2)
Total Protein: 7.2 g/dL (ref 6.1–8.1)
eGFR: 127 mL/min/{1.73_m2} (ref >=60)

## 2022-01-26 LAB — CBC WITH DIFFERENTIAL/PLATELET
Absolute Monocytes: 453 cells/uL (ref 200–950)
Basophils Absolute: 48 cells/uL (ref 0–200)
Basophils Relative: 1.1 %
Eosinophils Absolute: 101 cells/uL (ref 15–500)
Eosinophils Relative: 2.3 %
HCT: 24.4 % — ABNORMAL LOW (ref 35.0–45.0)
Hemoglobin: 6.3 g/dL — ABNORMAL LOW (ref 11.7–15.5)
Lymphs Abs: 1443 cells/uL (ref 850–3900)
MCH: 15.8 pg — ABNORMAL LOW (ref 27.0–33.0)
MCHC: 25.8 g/dL — ABNORMAL LOW (ref 32.0–36.0)
MCV: 61.3 fL — ABNORMAL LOW (ref 80.0–100.0)
Monocytes Relative: 10.3 %
Neutro Abs: 2354 cells/uL (ref 1500–7800)
Neutrophils Relative %: 53.5 %
Platelets: 398 10*3/uL (ref 140–400)
RBC: 3.98 10*6/uL (ref 3.80–5.10)
RDW: 20.5 % — ABNORMAL HIGH (ref 11.0–15.0)
Total Lymphocyte: 32.8 %
WBC: 4.4 10*3/uL (ref 3.8–10.8)

## 2022-01-26 LAB — CBC MORPHOLOGY

## 2022-01-26 NOTE — Telephone Encounter (Signed)
Aware, have sent pt to hospital for evaluation

## 2022-01-26 NOTE — Telephone Encounter (Signed)
Forwarded call to provider.

## 2022-01-26 NOTE — Telephone Encounter (Signed)
Lab tech from [employer] called to report a stat order for patient. Patients hemoglobin is low.

## 2022-01-26 NOTE — Addendum Note (Signed)
Addended by: Sharon Seller on: 01/26/2022 12:07 PM   Modules accepted: Orders, Level of Service

## 2022-01-27 ENCOUNTER — Emergency Department (HOSPITAL_BASED_OUTPATIENT_CLINIC_OR_DEPARTMENT_OTHER)
Admission: EM | Admit: 2022-01-27 | Discharge: 2022-01-27 | Discharge status: Against Medical Advice | Payer: Medicaid Other

## 2022-01-27 ENCOUNTER — Other Ambulatory Visit: Payer: Self-pay

## 2022-01-27 NOTE — ED Notes (Signed)
Patient called in lobby and outside.  Patient not found.

## 2022-01-29 ENCOUNTER — Telehealth: Payer: Self-pay | Admitting: *Deleted

## 2022-01-29 NOTE — Telephone Encounter (Signed)
Appointment scheduled in office on Wednesday with Dinah.   Patient stated that you told her that you were going to write her a work note to be out of work the last time you saw her and send it through [employer] and it is not there.  Patient is upset because she goes back to work tomorrow with no work note and stated that if she gets fired it is on you.   Stated that she went to 3 different places to be seen over the weekend and None of them had her blood so she left without being see.   Please Advise.     Has an appointment scheduled for Wednesday 01/31/2022 with Dinah at 9:20

## 2022-01-29 NOTE — Telephone Encounter (Signed)
Patient notified and agreed. Patient stated that she will go to [facility] ER.

## 2022-01-29 NOTE — Telephone Encounter (Signed)
Work note completed for Friday, she still needed to say in the emergency department to be evaluated and to be instructed by a provider. I believe she still needs to do this based on symptoms and recent lab work.

## 2022-01-29 NOTE — Telephone Encounter (Signed)
Patient called back and stated that she was returning your call. Stated that she went to the ER but they told her that they did not have "no Blood" so she left.  Stated that she went to [facility] and an Urgent Care but was never seen.   Patient is also stating that she is suppose to go back to work tomorrow but have no documentation. Stated that nothing has been done yet and she is still feeling back.    Patient wants you to call her at #[phone]

## 2022-01-29 NOTE — Telephone Encounter (Signed)
She needs to make appt or to go to the emergency department for evaluation

## 2022-01-30 ENCOUNTER — Emergency Department (HOSPITAL_COMMUNITY): Payer: Medicaid Other

## 2022-01-30 ENCOUNTER — Encounter (HOSPITAL_COMMUNITY): Payer: Self-pay

## 2022-01-30 ENCOUNTER — Encounter: Payer: Self-pay | Admitting: Family

## 2022-01-30 ENCOUNTER — Other Ambulatory Visit: Payer: Self-pay

## 2022-01-30 ENCOUNTER — Observation Stay (HOSPITAL_COMMUNITY)
Admission: EM | Admit: 2022-01-30 | Discharge: 2022-01-31 | Disposition: A | Discharge status: Home / Self Care | Payer: Medicaid Other | Attending: Internal Medicine | Admitting: Internal Medicine

## 2022-01-30 DIAGNOSIS — D649 Anemia, unspecified: Secondary | ICD-10-CM | POA: Diagnosis present

## 2022-01-30 DIAGNOSIS — N939 Abnormal uterine and vaginal bleeding, unspecified: Secondary | ICD-10-CM | POA: Insufficient documentation

## 2022-01-30 DIAGNOSIS — D509 Iron deficiency anemia, unspecified: Secondary | ICD-10-CM | POA: Diagnosis not present

## 2022-01-30 DIAGNOSIS — E538 Deficiency of other specified B group vitamins: Secondary | ICD-10-CM | POA: Insufficient documentation

## 2022-01-30 DIAGNOSIS — Z79899 Other long term (current) drug therapy: Secondary | ICD-10-CM | POA: Insufficient documentation

## 2022-01-30 DIAGNOSIS — R42 Dizziness and giddiness: Secondary | ICD-10-CM | POA: Diagnosis present

## 2022-01-30 LAB — ABO/RH: ABO/RH(D): B POS

## 2022-01-30 LAB — BASIC METABOLIC PANEL
Anion gap: 10 (ref 5–15)
BUN: 10 mg/dL (ref 6–20)
CO2: 22 mmol/L (ref 22–32)
Calcium: 9.4 mg/dL (ref 8.9–10.3)
Chloride: 106 mmol/L (ref 98–111)
Creatinine, Ser: 0.81 mg/dL (ref 0.44–1.00)
GFR, Estimated: >60 mL/min (ref >60)
Glucose, Bld: 90 mg/dL (ref 70–99)
Potassium: 3.7 mmol/L (ref 3.5–5.1)
Sodium: 138 mmol/L (ref 135–145)

## 2022-01-30 LAB — CBC WITH DIFFERENTIAL/PLATELET
Abs Immature Granulocytes: 0.02 10*3/uL (ref 0.00–0.07)
Basophils Absolute: 0 10*3/uL (ref 0.0–0.1)
Basophils Relative: 0 %
Eosinophils Absolute: 0.1 10*3/uL (ref 0.0–0.5)
Eosinophils Relative: 1 %
HCT: 25.3 % — ABNORMAL LOW (ref 36.0–46.0)
Hemoglobin: 6.8 g/dL — CL (ref 12.0–15.0)
Immature Granulocytes: 0 %
Lymphocytes Relative: 29 %
Lymphs Abs: 2 10*3/uL (ref 0.7–4.0)
MCH: 15.4 pg — ABNORMAL LOW (ref 26.0–34.0)
MCHC: 26.9 g/dL — ABNORMAL LOW (ref 30.0–36.0)
MCV: 57.2 fL — ABNORMAL LOW (ref 80.0–100.0)
Monocytes Absolute: 0.6 10*3/uL (ref 0.1–1.0)
Monocytes Relative: 8 %
Neutro Abs: 4.1 10*3/uL (ref 1.7–7.7)
Neutrophils Relative %: 62 %
Platelets: 538 10*3/uL — ABNORMAL HIGH (ref 150–400)
RBC: 4.42 MIL/uL (ref 3.87–5.11)
RDW: 22.7 % — ABNORMAL HIGH (ref 11.5–15.5)
WBC: 6.7 10*3/uL (ref 4.0–10.5)
nRBC: 0 % (ref 0.0–0.2)

## 2022-01-30 LAB — PREPARE RBC (CROSSMATCH)

## 2022-01-30 MED ORDER — SODIUM CHLORIDE 0.9 % IV SOLN
10.0000 mL/h | Freq: Once | INTRAVENOUS | Status: AC
  Administered 2022-01-31: 10 mL/h via INTRAVENOUS

## 2022-01-30 NOTE — ED Provider Notes (Incomplete)
[facility] EMERGENCY DEPARTMENT Provider Note   CSN: [id number] Arrival date & time: 01/30/22  1954     History {Add pertinent medical, surgical, social history, OB history to HPI:1} Chief Complaint  Patient presents with   Dizziness   Shortness of Breath    Alexandra Lowery is a 26 y.o. female.  The history is provided by the patient and medical records.  Dizziness Associated symptoms: shortness of breath   Shortness of Breath  26 y.o. F presenting to the ED with lightheadedness and SOB.  States she has been feeling this way for nearly 1 week.  She reports often feeling lightheaded with standing and needing to sit down right away or she feels like she will pass out.  Denies LOC or true syncope.  She does report having menstrual period, currently on her cycle and has been using "super tampons" that fill up within about an hour.  States last cycle was 12 days, one before that was 19 days.  States periods are not always this long but occasionally so.  She does not currently have a OB/GYN that she follows with, PCP was trying to set her up with one.  States prior blood transfusion in 2019 when pregnant with her son due to Fe+ deficiency anemia.  She has not had any transfusions since that time.  She is not currently taking iron supplements.  She does report eating about 7 cups of ice a day.  Home Medications Prior to Admission medications   Medication Sig Start Date End Date Taking? Authorizing Provider  terbinafine (LAMISIL) 250 MG tablet Take 1 tablet (250 mg total) by mouth daily. 01/25/22   Sharon Seller, NP  valACYclovir (VALTREX) 1000 MG tablet Daily by mouth for suppression, for flare increase to twice daily for 7 days. 01/25/22   Sharon Seller, NP  ferrous sulfate 325 (65 FE) MG tablet Take 1 tablet (325 mg total) by mouth 3 (three) times daily with meals. 07/23/17 01/28/19  Degele, Kandra Nicolas, MD      Allergies    Patient has no known allergies.     Review of Systems   Review of Systems  Respiratory:  Positive for shortness of breath.   Neurological:  Positive for light-headedness.  All other systems reviewed and are negative.   Physical Exam Updated Vital Signs BP 111/73 (BP Location: Left Arm)   Pulse (!) 103   Temp 98.6 F (37 C) (Oral)   Resp 16   Ht 5\' 10"  (1.778 m)   Wt 74.8 kg   SpO2 100%   BMI 23.68 kg/m  Physical Exam Vitals and nursing note reviewed.  Constitutional:      Appearance: She is well-developed.  HENT:     Head: Normocephalic and atraumatic.  Eyes:     Conjunctiva/sclera: Conjunctivae normal.     Pupils: Pupils are equal, round, and reactive to light.     Comments: Conjunctiva pale  Cardiovascular:     Rate and Rhythm: Normal rate and regular rhythm.     Heart sounds: Normal heart sounds.  Pulmonary:     Effort: Pulmonary effort is normal.     Breath sounds: Normal breath sounds.  Abdominal:     General: Bowel sounds are normal.     Palpations: Abdomen is soft.  Musculoskeletal:        General: Normal range of motion.     Cervical back: Normal range of motion.  Skin:    General: Skin is warm  and dry.  Neurological:     Mental Status: She is alert and oriented to person, place, and time.     ED Results / Procedures / Treatments   Labs (all labs ordered are listed, but only abnormal results are displayed) Labs Reviewed  CBC WITH DIFFERENTIAL/PLATELET - Abnormal; Notable for the following components:      Result Value   Hemoglobin 6.8 (*)    HCT 25.3 (*)    MCV 57.2 (*)    MCH 15.4 (*)    MCHC 26.9 (*)    RDW 22.7 (*)    Platelets 538 (*)    All other components within normal limits  BASIC METABOLIC PANEL  VITAMIN B12  FOLATE  IRON AND TIBC  FERRITIN  RETICULOCYTES  I-STAT BETA HCG BLOOD, ED ([facility], [facility], [facility] ONLY)  TYPE AND SCREEN  ABO/RH  PREPARE RBC (CROSSMATCH)    EKG None  Radiology DG Chest 2 View  Result Date: 01/30/2022 CLINICAL DATA:  Shortness of breath  EXAM: CHEST - 2 VIEW COMPARISON:  02/02/2020 FINDINGS: The heart size and mediastinal contours are within normal limits. Both lungs are clear. Mild scoliosis IMPRESSION: No active cardiopulmonary disease. Electronically Signed   By: Jasmine Pang M.D.   On: 01/30/2022 20:43    Procedures Procedures  {Document cardiac monitor, telemetry assessment procedure when appropriate:1}  Medications Ordered in ED Medications  0.9 %  sodium chloride infusion (has no administration in time range)    ED Course/ Medical Decision Making/ A&P                           Medical Decision Making Amount and/or Complexity of Data Reviewed Labs: ordered. ECG/medicine tests: ordered and independent interpretation performed.  Risk Prescription drug management. Decision regarding hospitalization.   26 year old female presenting to the ED with lightheadedness and shortness of breath.  Symptoms ongoing for the past week.  She was told recently that labs were abnormal, was directed to [facility] but left when she was told they did not have blood there for transfusion.  Does report history of iron deficiency anemia and heavy periods.  She is not currently on iron supplementation.  She is afebrile, nontoxic.  She is slightly tachycardic on exam but otherwise hemodynamically stable.  Conjunctive are pale.  She is ambulatory.  Labs today with hemoglobin of 6.8.  Based on her indices, suspicion of iron deficiency.  May also have bone loss contributing due to her heavy menses.  We will send anemia panel, transfuse.   Final Clinical Impression(s) / ED Diagnoses Final diagnoses:  None    Rx / DC Orders ED Discharge Orders     None

## 2022-01-30 NOTE — ED Provider Triage Note (Addendum)
Emergency Medicine Provider Triage Evaluation Note  Alexandra Lowery , a 26 y.o. female  was evaluated in triage.  Pt complains of shortness of breath onset 5 days ago.  Has associated dizziness.  Was evaluated evaluated on 6/15 and hemoglobin at that time was 6.5 however patient left AMA.  Patient has a history of anemia and heavy menstrual periods.  Denies taking iron supplements.  Denies hemoptysis or rectal bleeding..    Review of Systems  Positive: As per HPI Negative:   Physical Exam  BP 111/73 (BP Location: Left Arm)   Pulse (!) 103   Temp 98.6 F (37 C) (Oral)   Resp 16   SpO2 100%  Gen:   Awake, no distress   Resp:  Normal effort, increased work of breathing MSK:   Moves extremities without difficulty  Other:    Medical Decision Making  Medically screening exam initiated at 8:23 PM.  Appropriate orders placed.  Alexandra Lowery was informed that the remainder of the evaluation will be completed by another provider, this initial triage assessment does not replace that evaluation, and the importance of remaining in the ED until their evaluation is complete.  Work-up initiated   Jahmiya Guidotti A, PA-C 01/30/22 2026   9:02 PM - RN notified of hemoglobin of 6.8. Working on room assignment.    Luddie Boghosian A, PA-C 01/30/22 2104

## 2022-01-30 NOTE — ED Triage Notes (Signed)
Pt reports dizziness and SOB onset 5 days ago. She was seen at [facility] on 6/15 and her hgb was 6.5 but left AMA. Hx of anemia and heavy menstrual periods.Denies rectal bleeding.

## 2022-01-30 NOTE — ED Notes (Signed)
HGB 6.8. Dr. Silverio Lay informed via epic secure chat

## 2022-01-31 ENCOUNTER — Encounter: Payer: Medicaid Other | Admitting: Family

## 2022-01-31 ENCOUNTER — Encounter (HOSPITAL_COMMUNITY): Payer: Self-pay | Admitting: Family Medicine

## 2022-01-31 DIAGNOSIS — D649 Anemia, unspecified: Secondary | ICD-10-CM

## 2022-01-31 DIAGNOSIS — N939 Abnormal uterine and vaginal bleeding, unspecified: Secondary | ICD-10-CM | POA: Diagnosis present

## 2022-01-31 HISTORY — DX: Abnormal uterine and vaginal bleeding, unspecified: N93.9

## 2022-01-31 HISTORY — DX: Anemia, unspecified: D64.9

## 2022-01-31 LAB — RETICULOCYTES
Immature Retic Fract: 18 % — ABNORMAL HIGH (ref 2.3–15.9)
RBC.: 4.17 MIL/uL (ref 3.87–5.11)
Retic Count, Absolute: 44.2 10*3/uL (ref 19.0–186.0)
Retic Ct Pct: 1.1 % (ref 0.4–3.1)

## 2022-01-31 LAB — VITAMIN B12: Vitamin B-12: 191 pg/mL (ref 180–914)

## 2022-01-31 LAB — BASIC METABOLIC PANEL
Anion gap: 5 (ref 5–15)
BUN: 8 mg/dL (ref 6–20)
CO2: 24 mmol/L (ref 22–32)
Calcium: 8.8 mg/dL — ABNORMAL LOW (ref 8.9–10.3)
Chloride: 108 mmol/L (ref 98–111)
Creatinine, Ser: 0.63 mg/dL (ref 0.44–1.00)
GFR, Estimated: >60 mL/min (ref >60)
Glucose, Bld: 88 mg/dL (ref 70–99)
Potassium: 3.6 mmol/L (ref 3.5–5.1)
Sodium: 137 mmol/L (ref 135–145)

## 2022-01-31 LAB — FERRITIN: Ferritin: 1 ng/mL — ABNORMAL LOW (ref 11–307)

## 2022-01-31 LAB — I-STAT BETA HCG BLOOD, ED (MC, WL, AP ONLY): I-stat hCG, quantitative: <5 m[IU]/mL (ref <5)

## 2022-01-31 LAB — TSH: TSH: 3.206 u[IU]/mL (ref 0.350–4.500)

## 2022-01-31 LAB — IRON AND TIBC
Iron: 10 ug/dL — ABNORMAL LOW (ref 28–170)
Saturation Ratios: 2 % — ABNORMAL LOW (ref 10.4–31.8)
TIBC: 573 ug/dL — ABNORMAL HIGH (ref 250–450)
UIBC: 563 ug/dL

## 2022-01-31 LAB — FOLATE: Folate: 5.9 ng/mL — ABNORMAL LOW (ref >5.9)

## 2022-01-31 LAB — HEMOGLOBIN AND HEMATOCRIT, BLOOD
HCT: 25 % — ABNORMAL LOW (ref 36.0–46.0)
Hemoglobin: 7.2 g/dL — ABNORMAL LOW (ref 12.0–15.0)

## 2022-01-31 LAB — PREPARE RBC (CROSSMATCH)

## 2022-01-31 LAB — HIV ANTIBODY (ROUTINE TESTING W REFLEX): HIV Screen 4th Generation wRfx: NONREACTIVE

## 2022-01-31 MED ORDER — ACETAMINOPHEN 325 MG PO TABS
650.0000 mg | ORAL_TABLET | Freq: Four times a day (QID) | ORAL | Status: DC | PRN
  Administered 2022-01-31: 650 mg via ORAL
  Filled 2022-01-31: qty 2

## 2022-01-31 MED ORDER — GUAIFENESIN 100 MG/5ML PO LIQD
5.0000 mL | ORAL | Status: DC | PRN
  Filled 2022-01-31: qty 5

## 2022-01-31 MED ORDER — ONDANSETRON HCL 4 MG/2ML IJ SOLN: 4.0000 mg | Freq: Four times a day (QID) | INTRAMUSCULAR | Status: DC | PRN

## 2022-01-31 MED ORDER — HYDRALAZINE HCL 20 MG/ML IJ SOLN: 10.0000 mg | INTRAMUSCULAR | Status: DC | PRN

## 2022-01-31 MED ORDER — METOPROLOL TARTRATE 5 MG/5ML IV SOLN: 5.0000 mg | INTRAVENOUS | Status: DC | PRN

## 2022-01-31 MED ORDER — ACETAMINOPHEN 650 MG RE SUPP: 650.0000 mg | Freq: Four times a day (QID) | RECTAL | Status: DC | PRN

## 2022-01-31 MED ORDER — FERROUS SULFATE 325 (65 FE) MG PO TABS
325.0000 mg | ORAL_TABLET | Freq: Every day | ORAL | Status: DC
  Administered 2022-01-31: 325 mg via ORAL
  Filled 2022-01-31: qty 1

## 2022-01-31 MED ORDER — SENNOSIDES-DOCUSATE SODIUM 8.6-50 MG PO TABS: 1.0000 | ORAL_TABLET | Freq: Every evening | ORAL | Status: DC | PRN

## 2022-01-31 MED ORDER — SODIUM CHLORIDE 0.9 % IV SOLN
510.0000 mg | Freq: Once | INTRAVENOUS | Status: AC
  Administered 2022-01-31: 510 mg via INTRAVENOUS
  Filled 2022-01-31: qty 17

## 2022-01-31 MED ORDER — FOLIC ACID 1 MG PO TABS
1.0000 mg | ORAL_TABLET | Freq: Every day | ORAL | Status: DC
  Administered 2022-01-31: 1 mg via ORAL
  Filled 2022-01-31: qty 1

## 2022-01-31 MED ORDER — ONDANSETRON HCL 4 MG PO TABS: 4.0000 mg | ORAL_TABLET | Freq: Four times a day (QID) | ORAL | Status: DC | PRN

## 2022-01-31 MED ORDER — SODIUM CHLORIDE 0.9% IV SOLUTION: Freq: Once | INTRAVENOUS | Status: AC

## 2022-01-31 MED ORDER — DOCUSATE SODIUM 100 MG PO CAPS
100.0000 mg | ORAL_CAPSULE | Freq: Two times a day (BID) | ORAL | Status: DC
  Administered 2022-01-31: 100 mg via ORAL
  Filled 2022-01-31: qty 1

## 2022-01-31 MED ORDER — SENNOSIDES-DOCUSATE SODIUM 8.6-50 MG PO TABS: 1.0000 | ORAL_TABLET | Freq: Every evening | ORAL | 3 refills | Status: DC | PRN

## 2022-01-31 MED ORDER — FERROUS SULFATE 325 (65 FE) MG PO TABS: 325.0000 mg | ORAL_TABLET | Freq: Every day | ORAL | 3 refills | Status: DC

## 2022-01-31 MED ORDER — IPRATROPIUM-ALBUTEROL 0.5-2.5 (3) MG/3ML IN SOLN: 3.0000 mL | RESPIRATORY_TRACT | Status: DC | PRN

## 2022-01-31 MED ORDER — SODIUM CHLORIDE 0.9% IV SOLUTION: Freq: Once | INTRAVENOUS | Status: DC

## 2022-01-31 NOTE — Discharge Summary (Signed)
Physician Discharge Summary  Alexandra Lowery ZOX:[id number] DOB: Jun 19, 1996 DOA: 01/30/2022  PCP: Caesar Bookman, NP  Admit date: 01/30/2022 Discharge date: 01/31/2022  Admitted From: Home Disposition: Home  Recommendations for Outpatient Follow-up:  Follow up with PCP in 1-2 weeks Please obtain BMP/CBC in one week your next doctors visit.  Follow-up outpatient with OB/GYN Iron supplements and bowel regimen prescribed.  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  Went to her PCP and blood work showed hemoglobin of 6.3 therefore sent to the ED on 6/15 but patient left due to long wait time.  Since then she has had worsening of her symptoms.  Her last menstrual cycle was 12 days ago.  Her admission hemoglobin was 5.8, MCV 57.2.  1 unit PRBC was ordered.     Assessment & Plan:  Principal Problem:   Symptomatic anemia Active Problems:   Abnormal uterine bleeding     Symptomatic anemia; abnormal uterine bleeding  Microcytic anemia Folic acid deficiency -Admission hemoglobin 6.8.  After 1 unit of PRBC hemoglobin has come up to 7.2 we will give her another unit of PRBC as patient is still feeling some fatigue.  She would like to go home therefore we will discharge her later today after another unit of PRBC transfusion.  In the meantime we will also give her 1 dose of IV iron in the hospital and oral repletion along with bowel regimen upon discharge.  She will benefit from outpatient OB/GYN follow-up and PCP follow-up for repeat blood work and her heavy menstruation management. -Ferritin levels 1, folate 5.9.       Subjective: Has some exertional dyspnea but slightly improved compared to yesterday.  She states she has felt like  this for several months now.  She used to see an OB/GYN provider but has not seen one recently.  Discharge Exam: Vitals:   01/31/22 1142 01/31/22 1144  BP: 106/65 93/60  Pulse: 67 67  Resp:  16  Temp:  97.9 F (36.6 C)  SpO2: 100%    Vitals:   01/31/22 0900 01/31/22 1129 01/31/22 1142 01/31/22 1144  BP: 99/73 (!) 96/55 106/65 93/60  Pulse: 65 67 67 67  Resp: 17 16  16   Temp: 98.1 F (36.7 C) 97.7 F (36.5 C)  97.9 F (36.6 C)  TempSrc: Oral Oral  Oral  SpO2: 100%  100%   Weight:      Height:        General: Pt is alert, awake, not in acute distress Cardiovascular: RRR, S1/S2 +, no rubs, no gallops Respiratory: CTA bilaterally, no wheezing, no rhonchi Abdominal: Soft, NT, ND, bowel sounds + Extremities: no edema, no cyanosis  Discharge Instructions   Allergies as of 01/31/2022   No Known Allergies      Medication List     TAKE these medications    ferrous sulfate 325 (65 FE) MG tablet Take 1 tablet (325 mg total) by mouth daily with breakfast.   senna-docusate 8.6-50 MG tablet Commonly known as: Senokot-S Take 1 tablet by mouth at bedtime as needed for mild constipation.   terbinafine 250 MG tablet Commonly known as: LAMISIL Take 1 tablet (250 mg total) by mouth daily.   valACYclovir 1000 MG tablet Commonly known as: VALTREX Daily by mouth for suppression, for  flare increase to twice daily for 7 days.        No Known Allergies  You were cared for by a hospitalist during your hospital stay. If you have any questions about your discharge medications or the care you received while you were in the hospital after you are discharged, you can call the unit and asked to speak with the hospitalist on call if the hospitalist that took care of you is not available. Once you are discharged, your primary care physician will handle any further medical issues. Please note that no refills for any discharge medications will be authorized once you are discharged, as it  is imperative that you return to your primary care physician (or establish a relationship with a primary care physician if you do not have one) for your aftercare needs so that they can reassess your need for medications and monitor your lab values.   Procedures/Studies: DG Chest 2 View  Result Date: 01/30/2022 CLINICAL DATA:  Shortness of breath EXAM: CHEST - 2 VIEW COMPARISON:  02/02/2020 FINDINGS: The heart size and mediastinal contours are within normal limits. Both lungs are clear. Mild scoliosis IMPRESSION: No active cardiopulmonary disease. Electronically Signed   By: Donavan Foil M.D.   On: 01/30/2022 20:43     The results of significant diagnostics from this hospitalization (including imaging, microbiology, ancillary and laboratory) are listed below for reference.     Microbiology: No results found for this or any previous visit (from the past 240 hour(s)).   Labs: BNP (last 3 results) No results for input(s): "BNP" in the last 8760 hours. Basic Metabolic Panel: Recent Labs  Lab 01/25/22 1412 01/30/22 2030 01/31/22 0640  NA 138 138 137  K 4.1 3.7 3.6  CL 108 106 108  CO2 23 22 24   GLUCOSE 81 90 88  BUN 7 10 8   CREATININE 0.62 0.81 0.63  CALCIUM 9.0 9.4 8.8*   Liver Function Tests: Recent Labs  Lab 01/25/22 1412  AST 15  ALT 9  BILITOT 0.3  PROT 7.2   No results for input(s): "LIPASE", "AMYLASE" in the last 168 hours. No results for input(s): "AMMONIA" in the last 168 hours. CBC: Recent Labs  Lab 01/25/22 1412 01/30/22 2030 01/31/22 0640  WBC 4.4 6.7  --   NEUTROABS 2,354 4.1  --   HGB 6.3* 6.8* 7.2*  HCT 24.4* 25.3* 25.0*  MCV 61.3* 57.2*  --   PLT 398 538*  --    Cardiac Enzymes: No results for input(s): "CKTOTAL", "CKMB", "CKMBINDEX", "TROPONINI" in the last 168 hours. BNP: Invalid input(s): "POCBNP" CBG: No results for input(s): "GLUCAP" in the last 168 hours. D-Dimer No results for input(s): "DDIMER" in the last 72 hours. Hgb A1c No  results for input(s): "HGBA1C" in the last 72 hours. Lipid Profile No results for input(s): "CHOL", "HDL", "LDLCALC", "TRIG", "CHOLHDL", "LDLDIRECT" in the last 72 hours. Thyroid function studies Recent Labs    01/31/22 0853  TSH 3.206   Anemia work up Recent Labs    01/30/22 2343  VITAMINB12 191  FOLATE 5.9*  FERRITIN 1*  TIBC 573*  IRON 10*  RETICCTPCT 1.1   Urinalysis    Component Value Date/Time   COLORURINE YELLOW 08/06/2017 2155   APPEARANCEUR CLOUDY (A) 08/06/2017 2155   LABSPEC 1.020 02/21/2021 1732   PHURINE 8.5 (H) 02/21/2021 1732   GLUCOSEU NEGATIVE 02/21/2021 1732   HGBUR MODERATE (A) 02/21/2021 1732   BILIRUBINUR Negative 06/05/2021 [place] 02/21/2021 1732  PROTEINUR Negative 06/05/2021 1038   PROTEINUR NEGATIVE 02/21/2021 1732   UROBILINOGEN 0.2 06/05/2021 1038   UROBILINOGEN 1.0 02/21/2021 1732   NITRITE Negative 06/05/2021 1038   NITRITE NEGATIVE 02/21/2021 1732   LEUKOCYTESUR Negative 06/05/2021 1038   LEUKOCYTESUR TRACE (A) 02/21/2021 1732   Sepsis Labs Recent Labs  Lab 01/25/22 1412 01/30/22 2030  WBC 4.4 6.7   Microbiology No results found for this or any previous visit (from the past 240 hour(s)).   Time coordinating discharge:  I have spent 35 minutes face to face with the patient and on the ward discussing the patients care, assessment, plan and disposition with other care givers. >50% of the time was devoted counseling the patient about the risks and benefits of treatment/Discharge disposition and coordinating care.   SIGNED:   Dimple Nanas, MD  [facility] 01/31/2022, 12:09 PM   If 7PM-7AM, please contact night-coverage

## 2022-01-31 NOTE — ED Notes (Signed)
Breakfast tray delivered

## 2022-01-31 NOTE — H&P (Signed)
History and Physical    TANIKKA BRESNAN GUY:[id number] DOB: 10-04-1995 DOA: 01/30/2022  PCP: Caesar Bookman, NP   Patient coming from: Home   Chief Complaint: DOE, lightheaded, fatigue, low Hgb  HPI: Alexandra Lowery is a pleasant 26 y.o. female with medical history significant for long and heavy menstrual periods now presents emergency department with lightheadedness, exertional dyspnea, and fatigue.  She had blood work performed by her PCPs clinic on 01/25/2022, was found to have hemoglobin of 6.3, and was directed to the ED for evaluation.  She went to an ED on 01/27/2022 but eventually left due to long wait times.  She has continued to experience worsening lightheadedness, dyspnea, and fatigue, prompting her return tonight.  She reports a long history of periods lasting up to 19 days and with heavy volume.  Last menstrual period lasted 12 days and she is not currently bleeding.  She denies melena or hematochezia.  She reports taking oral contraceptive previously but experienced intolerable cramping.   ED Course: Upon arrival to the ED, patient is found to be afebrile and saturating well on room air with heart rate 103 and systolic blood pressure 110.  Blood work notable for hemoglobin 6.8 with MCV 57.2 and platelets 538.  Anemia panel was sent and 1 unit of RBC ordered for immediate transfusion from the ED.  Review of Systems:  All other systems reviewed and apart from HPI, are negative.  Past Medical History:  Diagnosis Date   Chlamydia 01/18/2017   Gonorrhea 2015 and 2017   HPV in female    Hx of migraines     Past Surgical History:  Procedure Laterality Date   WISDOM TOOTH EXTRACTION      Social History:   reports that she has never smoked. She has never used smokeless tobacco. She reports current alcohol use. She reports that she does not use drugs.  No Known Allergies  Family History  Problem Relation Age of Onset   Healthy Mother    Healthy Father     Hypertension Maternal Grandmother      Prior to Admission medications   Medication Sig Start Date End Date Taking? Authorizing Provider  terbinafine (LAMISIL) 250 MG tablet Take 1 tablet (250 mg total) by mouth daily. 01/25/22   Sharon Seller, NP  valACYclovir (VALTREX) 1000 MG tablet Daily by mouth for suppression, for flare increase to twice daily for 7 days. 01/25/22   Sharon Seller, NP  ferrous sulfate 325 (65 FE) MG tablet Take 1 tablet (325 mg total) by mouth 3 (three) times daily with meals. 07/23/17 01/28/19  Degele, Kandra Nicolas, MD    Physical Exam: Vitals:   01/30/22 2026 01/31/22 0004 01/31/22 0016 01/31/22 0016  BP:  104/74 (!) 93/53 (!) 93/53  Pulse:  83 88 80  Resp:  16 18 18   Temp:  98.3 F (36.8 C) (!) 97.4 F (36.3 C) (!) 97.3 F (36.3 C)  TempSrc:   Oral Tympanic  SpO2:   100% 100%  Weight: 74.8 kg     Height: 5\' 10"  (1.778 m)       Constitutional: NAD, calm  Eyes: PERTLA, lids and conjunctivae normal ENMT: Mucous membranes are moist. Posterior pharynx clear of any exudate or lesions.   Neck: supple, no masses  Respiratory: no wheezing, no crackles. No accessory muscle use.  Cardiovascular: S1 & S2 heard, regular rate and rhythm. No extremity edema.  Abdomen: No distension, no tenderness, soft. Bowel sounds active.  Musculoskeletal: no  clubbing / cyanosis. No joint deformity upper and lower extremities.   Skin: no significant rashes, lesions, ulcers. Warm, dry, well-perfused. Neurologic: CN 2-12 grossly intact. Moving all extremities. Alert and oriented.  Psychiatric: Pleasant. Cooperative.    Labs and Imaging on Admission: I have personally reviewed following labs and imaging studies  CBC: Recent Labs  Lab 01/25/22 1412 01/30/22 2030  WBC 4.4 6.7  NEUTROABS 2,354 4.1  HGB 6.3* 6.8*  HCT 24.4* 25.3*  MCV 61.3* 57.2*  PLT 398 538*   Basic Metabolic Panel: Recent Labs  Lab 01/25/22 1412 01/30/22 2030  NA 138 138  K 4.1 3.7  CL 108 106   CO2 23 22  GLUCOSE 81 90  BUN 7 10  CREATININE 0.62 0.81  CALCIUM 9.0 9.4   GFR: Estimated Creatinine Clearance: 114.8 mL/min (by C-G formula based on SCr of 0.81 mg/dL). Liver Function Tests: Recent Labs  Lab 01/25/22 1412  AST 15  ALT 9  BILITOT 0.3  PROT 7.2   No results for input(s): "LIPASE", "AMYLASE" in the last 168 hours. No results for input(s): "AMMONIA" in the last 168 hours. Coagulation Profile: No results for input(s): "INR", "PROTIME" in the last 168 hours. Cardiac Enzymes: No results for input(s): "CKTOTAL", "CKMB", "CKMBINDEX", "TROPONINI" in the last 168 hours. BNP (last 3 results) No results for input(s): "PROBNP" in the last 8760 hours. HbA1C: No results for input(s): "HGBA1C" in the last 72 hours. CBG: No results for input(s): "GLUCAP" in the last 168 hours. Lipid Profile: No results for input(s): "CHOL", "HDL", "LDLCALC", "TRIG", "CHOLHDL", "LDLDIRECT" in the last 72 hours. Thyroid Function Tests: No results for input(s): "TSH", "T4TOTAL", "FREET4", "T3FREE", "THYROIDAB" in the last 72 hours. Anemia Panel: Recent Labs    01/30/22 2343  VITAMINB12 191  FOLATE 5.9*  FERRITIN 1*  TIBC 573*  IRON 10*  RETICCTPCT 1.1   Urine analysis:    Component Value Date/Time   COLORURINE YELLOW 08/06/2017 2155   APPEARANCEUR CLOUDY (A) 08/06/2017 2155   LABSPEC 1.020 02/21/2021 1732   PHURINE 8.5 (H) 02/21/2021 1732   GLUCOSEU NEGATIVE 02/21/2021 1732   HGBUR MODERATE (A) 02/21/2021 1732   BILIRUBINUR Negative 06/05/2021 1038   KETONESUR NEGATIVE 02/21/2021 1732   PROTEINUR Negative 06/05/2021 1038   PROTEINUR NEGATIVE 02/21/2021 1732   UROBILINOGEN 0.2 06/05/2021 1038   UROBILINOGEN 1.0 02/21/2021 1732   NITRITE Negative 06/05/2021 1038   NITRITE NEGATIVE 02/21/2021 1732   LEUKOCYTESUR Negative 06/05/2021 1038   LEUKOCYTESUR TRACE (A) 02/21/2021 1732   Sepsis Labs: @LABRCNTIP (procalcitonin:4,lacticidven:4) )No results found for this or any  previous visit (from the past 240 hour(s)).   Radiological Exams on Admission: DG Chest 2 View  Result Date: 01/30/2022 CLINICAL DATA:  Shortness of breath EXAM: CHEST - 2 VIEW COMPARISON:  02/02/2020 FINDINGS: The heart size and mediastinal contours are within normal limits. Both lungs are clear. Mild scoliosis IMPRESSION: No active cardiopulmonary disease. Electronically Signed   By: 02/04/2020 M.D.   On: 01/30/2022 20:43    EKG: Independently reviewed. Sinus tachycardia, rate 104, incomplete RBBB.   Assessment/Plan   1. Symptomatic anemia; abnormal uterine bleeding  - Pt with hx of prolonged and heavy periods presents with fatigue, lightheadedness, DOE, and low Hgb on outpatient blood work  - She is not currently bleeding and is hemodynamically stable   - Initial Hgb 6.8 (10.1 in June 2021)  - 1 unit of RBC is transfusing in ED  - Reassess symptoms and H&H post-transfusion, follow-up with OBGYN outpatient  DVT prophylaxis: SCDs  Code Status: Full  Level of Care: Level of care: Med-Surg Family Communication: None present  Disposition Plan:  Patient is from: home  Anticipated d/c is to: home  Anticipated d/c date is: 01/31/22  Patient currently: Pending post-transfusion H&H  Consults called: none Admission status: Observation     Briscoe Deutscher, MD [facility]  01/31/2022, 1:12 AM

## 2022-02-01 LAB — TYPE AND SCREEN
ABO/RH(D): B POS
Antibody Screen: NEGATIVE
Unit division: 0
Unit division: 0

## 2022-02-01 LAB — BPAM RBC
Blood Product Expiration Date: 202306262359
Blood Product Expiration Date: 202307122359
ISSUE DATE / TIME: 202306202353
ISSUE DATE / TIME: 202306211109
Unit Type and Rh: 1700
Unit Type and Rh: 7300

## 2022-02-08 ENCOUNTER — Encounter: Payer: Medicaid Other | Admitting: Family

## 2022-02-08 NOTE — Progress Notes (Signed)
  This encounter was created in error - please disregard. No show 

## 2022-06-08 ENCOUNTER — Encounter: Payer: Medicaid Other | Admitting: Family

## 2022-06-11 NOTE — Progress Notes (Signed)
  This encounter was created in error - please disregard. No show 

## 2022-06-26 ENCOUNTER — Other Ambulatory Visit: Payer: Self-pay | Admitting: Nurse Practitioner

## 2022-06-26 DIAGNOSIS — A6004 Herpesviral vulvovaginitis: Secondary | ICD-10-CM

## 2022-08-13 NOTE — L&D Delivery Note (Signed)
[facility] Delivery Note  Alexandra Lowery is a 27 y.o. G3P2002 s/p SVD at [redacted]w[redacted]d. She was admitted for SOL.   ROM: 0h 36m with clear fluid GBS Status: Negative/-- (08/27 0923) Maximum Maternal Temperature: 98.33F   Labor Progress: Initial SVE: 5/90/-2. She then progressed to complete.   Delivery Date/Time: 05/01/23 0734 Delivery: Called to room and patient was complete and pushing. Head delivered LOP. No nuchal cord present. Shoulder and body delivered in usual fashion. Infant with spontaneous cry, placed on mother's abdomen, dried and stimulated. Cord clamped x 2 after 1-minute delay, and cut by patient's mother. Cord blood drawn. Placenta delivered spontaneously with gentle cord traction. Fundus firm with massage and Pitocin. TXA given at the time of delivery given h/o PPH. Labia, perineum, vagina, and cervix inspected inspected with a second degree laceration noted which was repaired in the usual fashion.  Baby Weight: pending  Placenta: Sent to L&D Complications: None Lacerations: second degree, repaired EBL: 493 mL Analgesia: Epidural   Infant:  APGAR (1 MIN):  9 APGAR (5 MINS):  10

## 2022-08-17 ENCOUNTER — Ambulatory Visit (HOSPITAL_COMMUNITY)
Admission: EM | Admit: 2022-08-17 | Discharge: 2022-08-17 | Disposition: A | Discharge status: Home / Self Care | Payer: Medicaid Other | Attending: Family Medicine | Admitting: Family Medicine

## 2022-08-17 ENCOUNTER — Encounter (HOSPITAL_COMMUNITY): Payer: Self-pay | Admitting: Emergency Medicine

## 2022-08-17 DIAGNOSIS — Z1152 Encounter for screening for COVID-19: Secondary | ICD-10-CM | POA: Insufficient documentation

## 2022-08-17 DIAGNOSIS — Z79899 Other long term (current) drug therapy: Secondary | ICD-10-CM | POA: Diagnosis not present

## 2022-08-17 DIAGNOSIS — Z3201 Encounter for pregnancy test, result positive: Secondary | ICD-10-CM | POA: Insufficient documentation

## 2022-08-17 DIAGNOSIS — R110 Nausea: Secondary | ICD-10-CM | POA: Diagnosis present

## 2022-08-17 DIAGNOSIS — Z331 Pregnant state, incidental: Secondary | ICD-10-CM

## 2022-08-17 DIAGNOSIS — J069 Acute upper respiratory infection, unspecified: Secondary | ICD-10-CM | POA: Diagnosis present

## 2022-08-17 DIAGNOSIS — Z113 Encounter for screening for infections with a predominantly sexual mode of transmission: Secondary | ICD-10-CM | POA: Diagnosis not present

## 2022-08-17 LAB — POC URINE PREG, ED: Preg Test, Ur: POSITIVE — AB

## 2022-08-17 MED ORDER — CLOTRIMAZOLE 1 % EX CREA: TOPICAL_CREAM | CUTANEOUS | 0 refills | Status: DC

## 2022-08-17 NOTE — Discharge Instructions (Addendum)
Your pregnancy test was positive   You have been swabbed for COVID, and the test will result in the next 24 hours. Our staff will call you if positive. If the COVID test is positive, you should quarantine for 5 days from the start of your symptoms  Apply clotrimazole to the rash areas 2 times daily.  Please follow-up with your primary care and OB/GYN

## 2022-08-17 NOTE — ED Triage Notes (Signed)
Pt reports for about 4 days having headache, congestion, waking up in sweats, nausea lower abd pains, and dizzy.   Pt has concerns about skin discoloration and itching for over a month. Reports went to her PCP and they did blood work but never informed what it is.

## 2022-08-17 NOTE — ED Provider Notes (Addendum)
[facility]    CSN: [id number] Arrival date & time: 08/17/22  1612      History   Chief Complaint Chief Complaint  Patient presents with   Headache   Nasal Congestion    HPI Alexandra Lowery is a 27 y.o. female.    Headache  Here for congestion and cough and dizziness and fever to 102.  Symptoms began on January 1.  She has had some nausea but no vomiting or diarrhea.  Last menstrual cycle was December 10.  She is still concerned about pregnancy  Also for about a month she has had a rash return; it is on her trunk, anterior and posterior and itches.  Her doctor saw her for it in the summer 2023 and did some lab and treated her with terbinafine for 2 weeks.  She states that it did not help, but the rash to go away within a month of taking the medication.  Past Medical History:  Diagnosis Date   Chlamydia 01/18/2017   Gonorrhea 2015 and 2017   HPV in female    Hx of migraines     Patient Active Problem List   Diagnosis Date Noted   Symptomatic anemia 01/31/2022   Abnormal uterine bleeding 01/31/2022   [redacted] weeks gestation of pregnancy 09/18/2017   Insufficient prenatal care in first trimester 04/21/2017   Anemia affecting pregnancy, antepartum 04/21/2017   Encounter for supervision of normal pregnancy, antepartum 04/10/2017   Chlamydia infection affecting pregnancy in first trimester 01/22/2017    Past Surgical History:  Procedure Laterality Date   WISDOM TOOTH EXTRACTION      OB History     Gravida  2   Para  1   Term  1   Preterm      AB      Living  1      SAB      IAB      Ectopic      Multiple      Live Births  1            Home Medications    Prior to Admission medications   Medication Sig Start Date End Date Taking? Authorizing Provider  clotrimazole (LOTRIMIN) 1 % cream Apply to affected area 2 times daily till better 08/17/22  Yes Payton Prinsen, Janace Aris, MD  metroNIDAZOLE (FLAGYL) 500 MG tablet Take 1 tablet (500 mg  total) by mouth 2 (two) times daily. 08/21/22   Lamptey, Britta Mccreedy, MD  valACYclovir (VALTREX) 1000 MG tablet TAKE 1 TABLET BY MOUTH EVERY DAY. FOR FLARE INCREASE TO TWICE DAILY FOR 7 DAYS. 06/26/22   Ngetich, Donalee Citrin, NP    Family History Family History  Problem Relation Age of Onset   Healthy Mother    Healthy Father    Hypertension Maternal Grandmother     Social History Social History   Tobacco Use   Smoking status: Never   Smokeless tobacco: Never  Vaping Use   Vaping Use: Every day  Substance Use Topics   Alcohol use: Yes    Comment: Social   Drug use: No     Allergies   Patient has no known allergies.   Review of Systems Review of Systems  Neurological:  Positive for headaches.     Physical Exam Triage Vital Signs ED Triage Vitals  Enc Vitals Group     BP 08/17/22 1751 125/76     Pulse Rate 08/17/22 1751 98     Resp 08/17/22 1751  17     Temp 08/17/22 1751 98.7 F (37.1 C)     Temp Source 08/17/22 1751 Oral     SpO2 08/17/22 1751 98 %     Weight --      Height --      Head Circumference --      Peak Flow --      Pain Score 08/17/22 1748 7     Pain Loc --      Pain Edu? --      Excl. in GC? --    No data found.  Updated Vital Signs BP 125/76 (BP Location: Left Arm)   Pulse 98   Temp 98.7 F (37.1 C) (Oral)   Resp 17   LMP 07/22/2022   SpO2 98%   Visual Acuity Right Eye Distance:   Left Eye Distance:   Bilateral Distance:    Right Eye Near:   Left Eye Near:    Bilateral Near:     Physical Exam Vitals reviewed.  Constitutional:      General: She is not in acute distress.    Appearance: She is not toxic-appearing.  HENT:     Right Ear: Tympanic membrane and ear canal normal.     Left Ear: Tympanic membrane and ear canal normal.     Nose: Congestion present.     Mouth/Throat:     Mouth: Mucous membranes are moist.     Pharynx: No oropharyngeal exudate or posterior oropharyngeal erythema.  Eyes:     Extraocular Movements:  Extraocular movements intact.     Conjunctiva/sclera: Conjunctivae normal.     Pupils: Pupils are equal, round, and reactive to light.  Cardiovascular:     Rate and Rhythm: Normal rate and regular rhythm.     Heart sounds: No murmur heard. Pulmonary:     Effort: Pulmonary effort is normal. No respiratory distress.     Breath sounds: No stridor. No wheezing, rhonchi or rales.  Abdominal:     Palpations: Abdomen is soft.     Tenderness: There is no abdominal tenderness.  Musculoskeletal:     Cervical back: Neck supple.  Lymphadenopathy:     Cervical: No cervical adenopathy.  Skin:    Capillary Refill: Capillary refill takes less than 2 seconds.     Coloration: Skin is not jaundiced or pale.     Comments: There is a scattered fairly macular rash, with irregular sizes from half a centimeter in size to 2 cm in size.  This rash is on her chest and her back and her abdomen.  Neurological:     General: No focal deficit present.     Mental Status: She is alert and oriented to person, place, and time.  Psychiatric:        Behavior: Behavior normal.      UC Treatments / Results  Labs (all labs ordered are listed, but only abnormal results are displayed) Labs Reviewed  POC URINE PREG, ED - Abnormal; Notable for the following components:      Result Value   Preg Test, Ur POSITIVE (*)    All other components within normal limits  CERVICOVAGINAL ANCILLARY ONLY - Abnormal; Notable for the following components:   Trichomonas Positive (*)    Bacterial Vaginitis (gardnerella) Positive (*)    All other components within normal limits  SARS CORONAVIRUS 2 (TAT 6-24 HRS)    EKG   Radiology No results found.  Procedures Procedures (including critical care time)  Medications Ordered in UC Medications -  No data to display  Initial Impression / Assessment and Plan / UC Course  I have reviewed the triage vital signs and the nursing notes.  Pertinent labs & imaging results that were  available during my care of the patient were reviewed by me and considered in my medical decision making (see chart for details).        UPT is positive.  We have discussed antinausea measures.  She is swabbed for COVID and if positive she is a candidate for Paxlovid, as her last EGFR in June of this year was greater than 60  She had requested swabs for STDs, and staff will notify her and treat per protocol any positives.  Since she is pregnant I am not going to treat with a systemic tablet for the tinea corporis.  I will treat topical it first Final Clinical Impressions(s) / UC Diagnoses   Final diagnoses:  Viral upper respiratory tract infection  Nausea  Screen for STD (sexually transmitted disease)  Pregnancy test-positive     Discharge Instructions      Your pregnancy test was positive   You have been swabbed for COVID, and the test will result in the next 24 hours. Our staff will call you if positive. If the COVID test is positive, you should quarantine for 5 days from the start of your symptoms  Apply clotrimazole to the rash areas 2 times daily.  Please follow-up with your primary care and OB/GYN      ED Prescriptions     Medication Sig Dispense Auth. Provider   clotrimazole (LOTRIMIN) 1 % cream Apply to affected area 2 times daily till better 60 g Neeti Knudtson, Janace Aris, MD      PDMP not reviewed this encounter.   Zenia Resides, MD 08/17/22 1849    Zenia Resides, MD 08/22/22 [phone]

## 2022-08-18 LAB — SARS CORONAVIRUS 2 (TAT 6-24 HRS): SARS Coronavirus 2: NEGATIVE

## 2022-08-20 LAB — CERVICOVAGINAL ANCILLARY ONLY
Bacterial Vaginitis (gardnerella): POSITIVE — AB
Candida Glabrata: NEGATIVE
Candida Vaginitis: NEGATIVE
Chlamydia: NEGATIVE
Comment: NEGATIVE
Comment: NEGATIVE
Comment: NEGATIVE
Comment: NEGATIVE
Comment: NEGATIVE
Comment: NORMAL
Neisseria Gonorrhea: NEGATIVE
Trichomonas: POSITIVE — AB

## 2022-08-21 ENCOUNTER — Telehealth (HOSPITAL_COMMUNITY): Payer: Self-pay | Admitting: Emergency Medicine

## 2022-08-21 MED ORDER — METRONIDAZOLE 500 MG PO TABS: 500.0000 mg | ORAL_TABLET | Freq: Two times a day (BID) | ORAL | 0 refills | Status: DC

## 2022-10-06 ENCOUNTER — Encounter (HOSPITAL_COMMUNITY): Payer: Self-pay

## 2022-10-06 ENCOUNTER — Ambulatory Visit (HOSPITAL_COMMUNITY)
Admission: EM | Admit: 2022-10-06 | Discharge: 2022-10-06 | Disposition: A | Discharge status: Home / Self Care | Payer: Medicaid Other | Attending: Family Medicine | Admitting: Family Medicine

## 2022-10-06 DIAGNOSIS — J069 Acute upper respiratory infection, unspecified: Secondary | ICD-10-CM | POA: Diagnosis not present

## 2022-10-06 DIAGNOSIS — R110 Nausea: Secondary | ICD-10-CM | POA: Diagnosis not present

## 2022-10-06 DIAGNOSIS — R5383 Other fatigue: Secondary | ICD-10-CM | POA: Insufficient documentation

## 2022-10-06 DIAGNOSIS — O99511 Diseases of the respiratory system complicating pregnancy, first trimester: Secondary | ICD-10-CM

## 2022-10-06 DIAGNOSIS — Z3A11 11 weeks gestation of pregnancy: Secondary | ICD-10-CM | POA: Insufficient documentation

## 2022-10-06 LAB — BASIC METABOLIC PANEL
Anion gap: 13 (ref 5–15)
BUN: <5 mg/dL — ABNORMAL LOW (ref 6–20)
CO2: 20 mmol/L — ABNORMAL LOW (ref 22–32)
Calcium: 9.4 mg/dL (ref 8.9–10.3)
Chloride: 98 mmol/L (ref 98–111)
Creatinine, Ser: 0.66 mg/dL (ref 0.44–1.00)
GFR, Estimated: >60 mL/min (ref >60)
Glucose, Bld: 64 mg/dL — ABNORMAL LOW (ref 70–99)
Potassium: 3.5 mmol/L (ref 3.5–5.1)
Sodium: 131 mmol/L — ABNORMAL LOW (ref 135–145)

## 2022-10-06 LAB — CBC
HCT: 37.6 % (ref 36.0–46.0)
Hemoglobin: 12.1 g/dL (ref 12.0–15.0)
MCH: 26 pg (ref 26.0–34.0)
MCHC: 32.2 g/dL (ref 30.0–36.0)
MCV: 80.7 fL (ref 80.0–100.0)
Platelets: 222 10*3/uL (ref 150–400)
RBC: 4.66 MIL/uL (ref 3.87–5.11)
RDW: 16.4 % — ABNORMAL HIGH (ref 11.5–15.5)
WBC: 8.6 10*3/uL (ref 4.0–10.5)
nRBC: 0 % (ref 0.0–0.2)

## 2022-10-06 LAB — POC INFLUENZA A AND B ANTIGEN (URGENT CARE ONLY)
INFLUENZA A ANTIGEN, POC: NEGATIVE
INFLUENZA B ANTIGEN, POC: NEGATIVE

## 2022-10-06 MED ORDER — VITAMIN B-6 25 MG PO TABS: 25.0000 mg | ORAL_TABLET | Freq: Three times a day (TID) | ORAL | 2 refills | Status: DC | PRN

## 2022-10-06 MED ORDER — ACETAMINOPHEN 325 MG PO TABS
ORAL_TABLET | ORAL | Status: AC
  Filled 2022-10-06: qty 3

## 2022-10-06 MED ORDER — SODIUM CHLORIDE 0.9 % IV BOLUS
1000.0000 mL | Freq: Once | INTRAVENOUS | Status: AC
  Administered 2022-10-06: 1000 mL via INTRAVENOUS

## 2022-10-06 MED ORDER — METOCLOPRAMIDE HCL 5 MG/ML IJ SOLN
INTRAMUSCULAR | Status: AC
  Filled 2022-10-06: qty 2

## 2022-10-06 MED ORDER — METOCLOPRAMIDE HCL 5 MG/ML IJ SOLN
5.0000 mg | Freq: Once | INTRAMUSCULAR | Status: AC
  Administered 2022-10-06: 5 mg via INTRAMUSCULAR

## 2022-10-06 MED ORDER — ACETAMINOPHEN 325 MG PO TABS
975.0000 mg | ORAL_TABLET | Freq: Once | ORAL | Status: AC
  Administered 2022-10-06: 975 mg via ORAL

## 2022-10-06 NOTE — ED Triage Notes (Signed)
Patient here for HA, cough, ST, N&V, and lightheadedness this morning. Patient is pregnant. Has not tried any medications. 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 No travel.

## 2022-10-06 NOTE — Discharge Instructions (Signed)
Your influenza test was negative. Please do your best to ensure adequate fluid intake in order to avoid dehydration. If you find that you are unable to tolerate drinking fluids regularly please proceed to the Emergency Department for evaluation.  You have had labs (blood tests) sent today. We will call you with any significant abnormalities or if there is need to begin or change treatment or pursue further follow up.  You may also review your test results online through [facility]. If you do not have a MyChart account, instructions to sign up should be on your discharge paperwork.

## 2022-10-08 NOTE — ED Provider Notes (Signed)
[facility]   [id number] 10/06/22 Arrival Time: 1005  ASSESSMENT & PLAN:  1. Other fatigue   2. Viral URI with cough   3. [redacted] weeks gestation of pregnancy   4. Nausea without vomiting   5. Respiratory system disease complicating pregnancy in first trimester    Discussed typical duration of likely viral illness. Reviewed: Labs Reviewed  BASIC METABOLIC PANEL - Abnormal; Notable for the following components:      Result Value   Sodium 131 (*)    CO2 20 (*)    Glucose, Bld 64 (*)    BUN <5 (*)    All other components within normal limits  CBC - Abnormal; Notable for the following components:   RDW 16.4 (*)    All other components within normal limits  POC INFLUENZA A AND B ANTIGEN (URGENT CARE ONLY)   Influenza negative. RN to inform regarding repeat BMP this week to see Na trend. Tolerating PO fluids. OTC symptom care as needed.  Discharge Medication List as of 10/06/2022  1:42 PM     START taking these medications   Details  pyridOXINE (VITAMIN B6) 25 MG tablet Take 1 tablet (25 mg total) by mouth 3 (three) times daily as needed (nausea)., Starting Sat 10/06/2022, Normal         Follow-up Information     Go to  [facility] ([facility]).   Why: If symptoms worsen in any way. Contact information: [address],  [place]  [phone]                Reviewed expectations re: course of current medical issues. Questions answered. Outlined signs and symptoms indicating need for more acute intervention. Understanding verbalized. After Visit Summary given.   SUBJECTIVE: History from: Patient. Alexandra Lowery is a 27 y.o. female. Patient here for HA, cough, ST, N without V, and lightheadedness; gradual onset over past 24 hours. Patient is 54 w pregnant. No complications. Denies abd pain, vaginal bleeding or LOF. No tx PTA. 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 No travel.   Denies: fever. Normal PO intake without n/v/d.  OBJECTIVE:  Vitals:   10/06/22 1030 10/06/22 1035 10/06/22 1313  BP: 101/65  (!) 90/51  Pulse: 91  88  Resp: 16  18  Temp: 100.2 F (37.9 C)  99.5 F (37.5 C)  TempSrc: Oral  Oral  SpO2: 100%  97%  Weight:  79.8 kg   Height:  5' 10.5" (1.791 m)     General appearance: alert; no distress Eyes: PERRLA; EOMI; conjunctiva normal HENT: [place]; AT; with nasal congestion Neck: supple  Lungs: speaks full sentences without difficulty; unlabored; CTAB; dry cough Extremities: no edema Skin: warm and dry Neurologic: normal gait Psychological: alert and cooperative; normal mood and affect  Labs: Results for orders placed or performed during the hospital encounter of [id number]  Basic metabolic panel  Result Value Ref Range   Sodium 131 (L) 135 - 145 mmol/L   Potassium 3.5 3.5 - 5.1 mmol/L   Chloride 98 98 - 111 mmol/L   CO2 20 (L) 22 - 32 mmol/L   Glucose, Bld 64 (L) 70 - 99 mg/dL   BUN <5 (L) 6 - 20 mg/dL   Creatinine, Ser 0.66 0.44 - 1.00 mg/dL   Calcium 9.4 8.9 - 10.3 mg/dL   GFR, Estimated >60 >60 mL/min   Anion gap 13 5 - 15  CBC  Result Value Ref Range   WBC 8.6 4.0 - 10.5 K/uL   RBC 4.66 3.87 - 5.11 MIL/uL   Hemoglobin 12.1 12.0 - 15.0 g/dL   HCT 37.6 36.0 - 46.0 %   MCV 80.7 80.0 - 100.0 fL   MCH 26.0 26.0 - 34.0 pg   MCHC 32.2 30.0 - 36.0 g/dL   RDW 16.4 (H) 11.5 - 15.5 %   Platelets 222 150 - 400 K/uL   nRBC 0.0 0.0 - 0.2 %  POC Influenza A & B Ag (Urgent Care)  Result Value Ref Range   INFLUENZA A ANTIGEN, POC NEGATIVE NEGATIVE   INFLUENZA B ANTIGEN, POC NEGATIVE NEGATIVE   Labs Reviewed  BASIC METABOLIC PANEL - Abnormal; Notable for the following components:      Result Value   Sodium 131 (*)    CO2 20 (*)    Glucose, Bld 64 (*)    BUN <5 (*)    All other components within normal limits  CBC - Abnormal; Notable for the following components:   RDW 16.4 (*)    All other components within normal limits  POC  INFLUENZA A AND B ANTIGEN (URGENT CARE ONLY)    Imaging: No results found.  No Known Allergies  Past Medical History:  Diagnosis Date   Chlamydia 01/18/2017   Gonorrhea 2015 and 2017   HPV in female    Hx of migraines    Social History   Socioeconomic History   Marital status: Single    Spouse name: Not on file   Number of children: Not on file   Years of education: Not on file   Highest education level: Not on file  Occupational History   Not on file  Tobacco Use   Smoking status: Never   Smokeless tobacco: Never  Vaping Use   Vaping Use: Every day  Substance and Sexual Activity   Alcohol use: Not Currently    Comment: Social   Drug use: No   Sexual activity: Not Currently    Partners: Male    Birth control/protection: None  Other Topics Concern   Not on file  Social History Narrative   Tobacco use, amount per day now:   Past tobacco use, amount per day:   How many years did you use tobacco:   Alcohol use (drinks per week):   Diet:   Do you drink/eat things with caffeine:   Marital status:   Single                               What year were you married?   Do you live in a house, apartment, assisted living, condo, trailer, etc.? House   Is it one or more stories?   How many persons live in your home? 7   Do you have pets in your home?( please list) No   Highest Level of education completed? 12th Grade   Current or past profession:   Do you exercise? No                                 Type and how often?   Do you have a living will? No   Do you have a DNR form?    No  If not, do you want to discuss one?   Do you have signed POA/HPOA forms?  No                      If so, please bring to you appointment      Do you have any difficulty bathing or dressing yourself? No    Do you have any difficulty preparing food or eating? No   Do you have any difficulty managing your medications? No   Do you have any difficulty managing your  finances? No   Do you have any difficulty affording your medications? No   Social Determinants of Health   Financial Resource Strain: Not on file  Food Insecurity: Not on file  Transportation Needs: Not on file  Physical Activity: Not on file  Stress: Not on file  Social Connections: Not on file  Intimate Partner Violence: Not on file   Family History  Problem Relation Age of Onset   Healthy Mother    Healthy Father    Hypertension Maternal Grandmother    Past Surgical History:  Procedure Laterality Date   WISDOM TOOTH EXTRACTION       Vanessa Kick, MD 10/08/22 1421    Vanessa Kick, MD 10/11/22 [phone]

## 2022-10-19 ENCOUNTER — Encounter (HOSPITAL_COMMUNITY): Payer: Self-pay | Admitting: *Deleted

## 2022-10-19 ENCOUNTER — Other Ambulatory Visit: Payer: Self-pay

## 2022-10-19 ENCOUNTER — Inpatient Hospital Stay (HOSPITAL_COMMUNITY)
Admission: AD | Admit: 2022-10-19 | Discharge: 2022-10-19 | Disposition: A | Discharge status: Home / Self Care | Payer: Medicaid Other | Attending: Obstetrics and Gynecology | Admitting: Obstetrics and Gynecology

## 2022-10-19 DIAGNOSIS — R102 Pelvic and perineal pain: Secondary | ICD-10-CM | POA: Diagnosis present

## 2022-10-19 DIAGNOSIS — O210 Mild hyperemesis gravidarum: Secondary | ICD-10-CM | POA: Insufficient documentation

## 2022-10-19 DIAGNOSIS — O26891 Other specified pregnancy related conditions, first trimester: Secondary | ICD-10-CM | POA: Insufficient documentation

## 2022-10-19 DIAGNOSIS — N949 Unspecified condition associated with female genital organs and menstrual cycle: Secondary | ICD-10-CM

## 2022-10-19 DIAGNOSIS — O219 Vomiting of pregnancy, unspecified: Secondary | ICD-10-CM

## 2022-10-19 DIAGNOSIS — Z3A12 12 weeks gestation of pregnancy: Secondary | ICD-10-CM

## 2022-10-19 LAB — URINALYSIS, ROUTINE W REFLEX MICROSCOPIC
Bilirubin Urine: NEGATIVE
Glucose, UA: NEGATIVE mg/dL
Hgb urine dipstick: NEGATIVE
Ketones, ur: NEGATIVE mg/dL
Leukocytes,Ua: NEGATIVE
Nitrite: NEGATIVE
Protein, ur: NEGATIVE mg/dL
Specific Gravity, Urine: 1.025 (ref 1.005–1.030)
pH: 5 (ref 5.0–8.0)

## 2022-10-19 LAB — WET PREP, GENITAL
Clue Cells Wet Prep HPF POC: NONE SEEN
Sperm: NONE SEEN
Trich, Wet Prep: NONE SEEN
WBC, Wet Prep HPF POC: <10 (ref <10)
Yeast Wet Prep HPF POC: NONE SEEN

## 2022-10-19 MED ORDER — METOCLOPRAMIDE HCL 10 MG PO TABS: 10.0000 mg | ORAL_TABLET | Freq: Three times a day (TID) | ORAL | 2 refills | Status: DC | PRN

## 2022-10-19 NOTE — MAU Provider Note (Signed)
History     [id number]  Arrival date and time: 10/19/22 1421    Chief Complaint  Patient presents with   Abdominal Pain   Morning Sickness     HPI Alexandra Lowery is a 27 y.o. at 57w5dby LMP who presents for abdominal pain & nausea. Reports intermittent pain for over a week. Describes as period like cramping & low back pain. Also has pelvic pain that's worse when she walks. Has had daily nausea & vomiting - but states she mostly gags. Does not have antiemetic at home. Denies fever, dysuria, vaginal bleeding, diarrhea, or vaginal discharge.    --/--/B POS Performed at [facility] [address] [address] [place] [id number] (06/20 2200)  OB History     Gravida  3   Para  2   Term  2   Preterm      AB      Living  2      SAB      IAB      Ectopic      Multiple      Live Births  2           Past Medical History:  Diagnosis Date   Chlamydia 01/18/2017   Gonorrhea 2015 and 2017   HPV in female    Hx of migraines     Past Surgical History:  Procedure Laterality Date   WISDOM TOOTH EXTRACTION      Family History  Problem Relation Age of Onset   Healthy Mother    Healthy Father    Hypertension Maternal Grandmother     Social History   Socioeconomic History   Marital status: Single    Spouse name: Not on file   Number of children: Not on file   Years of education: Not on file   Highest education level: Not on file  Occupational History   Not on file  Tobacco Use   Smoking status: Never   Smokeless tobacco: Never  Vaping Use   Vaping Use: Every day  Substance and Sexual Activity   Alcohol use: Not Currently    Comment: Social   Drug use: No   Sexual activity: Not Currently    Partners: Male    Birth control/protection: None  Other Topics Concern   Not on file  Social History Narrative   Tobacco use, amount per day now:   Past tobacco use, amount per day:   How many years did you use tobacco:   Alcohol use (drinks  per week):   Diet:   Do you drink/eat things with caffeine:   Marital status:   Single                               What year were you married?   Do you live in a house, apartment, assisted living, condo, trailer, etc.? House   Is it one or more stories?   How many persons live in your home? 7   Do you have pets in your home?( please list) No   Highest Level of education completed? 12th Grade   Current or past profession:   Do you exercise? No                                 Type and how often?   Do you have a living  will? No   Do you have a DNR form?    No                               If not, do you want to discuss one?   Do you have signed POA/HPOA forms?  No                      If so, please bring to you appointment      Do you have any difficulty bathing or dressing yourself? No    Do you have any difficulty preparing food or eating? No   Do you have any difficulty managing your medications? No   Do you have any difficulty managing your finances? No   Do you have any difficulty affording your medications? No   Social Determinants of Radio broadcast assistant Strain: Not on file  Food Insecurity: Not on file  Transportation Needs: Not on file  Physical Activity: Not on file  Stress: Not on file  Social Connections: Not on file  Intimate Partner Violence: Not on file    No Known Allergies  No current facility-administered medications on file prior to encounter.   Current Outpatient Medications on File Prior to Encounter  Medication Sig Dispense Refill   clotrimazole (LOTRIMIN) 1 % cream Apply to affected area 2 times daily till better 60 g 0   pyridOXINE (VITAMIN B6) 25 MG tablet Take 1 tablet (25 mg total) by mouth 3 (three) times daily as needed (nausea). 30 tablet 2   valACYclovir (VALTREX) 1000 MG tablet TAKE 1 TABLET BY MOUTH EVERY DAY. FOR FLARE INCREASE TO TWICE DAILY FOR 7 DAYS. 90 tablet 0     ROS Pertinent positives and negative per HPI, all others  reviewed and negative  Physical Exam   BP 104/60 (BP Location: Right Arm)   Pulse 80   Temp 98.4 F (36.9 C) (Oral)   Resp 18   Ht 5' 10.5" (1.791 m)   Wt 82.2 kg   LMP 08/22/2022 (Exact Date)   SpO2 100%   BMI 25.65 kg/m   Patient Vitals for the past 24 hrs:  BP Temp Temp src Pulse Resp SpO2 Height Weight  10/19/22 1533 104/60 98.4 F (36.9 C) Oral 80 18 100 % -- --  10/19/22 1524 -- -- -- -- -- -- 5' 10.5" (1.791 m) 82.2 kg    Physical Exam Vitals and nursing note reviewed.  Constitutional:      General: She is not in acute distress.    Appearance: She is well-developed. She is not ill-appearing.  HENT:     Head: Normocephalic and atraumatic.  Eyes:     General: No scleral icterus.       Right eye: No discharge.        Left eye: No discharge.     Conjunctiva/sclera: Conjunctivae normal.  Pulmonary:     Effort: Pulmonary effort is normal. No respiratory distress.  Abdominal:     Palpations: Abdomen is soft.     Tenderness: There is no abdominal tenderness. There is no right CVA tenderness or left CVA tenderness.  Skin:    General: Skin is warm and dry.  Neurological:     General: No focal deficit present.     Mental Status: She is alert.  Psychiatric:        Mood and Affect: Mood normal.  Behavior: Behavior normal.       Labs Results for orders placed or performed during the hospital encounter of 10/19/22 (from the past 24 hour(s))  Wet prep, genital     Status: None   Collection Time: 10/19/22  3:43 PM   Specimen: PATH Cytology Cervicovaginal Ancillary Only  Result Value Ref Range   Yeast Wet Prep HPF POC NONE SEEN NONE SEEN   Trich, Wet Prep NONE SEEN NONE SEEN   Clue Cells Wet Prep HPF POC NONE SEEN NONE SEEN   WBC, Wet Prep HPF POC <10 <10   Sperm NONE SEEN   Urinalysis, Routine w reflex microscopic -Urine, Clean Catch     Status: Abnormal   Collection Time: 10/19/22  3:46 PM  Result Value Ref Range   Color, Urine YELLOW YELLOW    APPearance HAZY (A) CLEAR   Specific Gravity, Urine 1.025 1.005 - 1.030   pH 5.0 5.0 - 8.0   Glucose, UA NEGATIVE NEGATIVE mg/dL   Hgb urine dipstick NEGATIVE NEGATIVE   Bilirubin Urine NEGATIVE NEGATIVE   Ketones, ur NEGATIVE NEGATIVE mg/dL   Protein, ur NEGATIVE NEGATIVE mg/dL   Nitrite NEGATIVE NEGATIVE   Leukocytes,Ua NEGATIVE NEGATIVE    Imaging No results found.  MAU Course  Procedures Lab Orders         Wet prep, genital         Urinalysis, Routine w reflex microscopic -Urine, Clean Catch    Meds ordered this encounter  Medications   metoCLOPramide (REGLAN) 10 MG tablet    Sig: Take 1 tablet (10 mg total) by mouth every 8 (eight) hours as needed for nausea or vomiting.    Dispense:  30 tablet    Refill:  2    Order Specific Question:   Supervising Provider    Answer:   Griffin Basil [id number]   Imaging Orders  No imaging studies ordered today    MDM moderate  Assessment and Plan   1. Nausea and vomiting during pregnancy  -Patient reports daily nausea & "gagging". No signs of dehydration & no active symptoms in MAU. Will prescribe reglan with refills for symptoms.   2. Round ligament pain  -FHT present via doppler. Benign abdominal exam. Patient stable at this time. Recommend maternity support belt for remainder of pregnancy. Reviewed SAB precautions & reasons to return to MAU.  -Keep schedule new OB appointment next month.   3. [redacted] weeks gestation of pregnancy     #FWB: FHR 151 per doppler    Dispo: discharged to home in stable condition.   Discharge Instructions     Discharge patient   Complete by: As directed    Discharge disposition: 01-Home or Self Care   Discharge patient date: 10/19/2022       Jorje Guild, NP 10/19/22 7:43 PM  Allergies as of 10/19/2022   No Known Allergies      Medication List     TAKE these medications    clotrimazole 1 % cream Commonly known as: LOTRIMIN Apply to affected area 2 times daily till better    metoCLOPramide 10 MG tablet Commonly known as: REGLAN Take 1 tablet (10 mg total) by mouth every 8 (eight) hours as needed for nausea or vomiting.   pyridOXINE 25 MG tablet Commonly known as: VITAMIN B6 Take 1 tablet (25 mg total) by mouth 3 (three) times daily as needed (nausea).   valACYclovir 1000 MG tablet Commonly known as: VALTREX TAKE 1 TABLET BY MOUTH EVERY DAY. FOR  FLARE INCREASE TO TWICE DAILY FOR 7 DAYS.

## 2022-10-19 NOTE — Discharge Instructions (Signed)
Return to care  If you have heavier bleeding that soaks through more than 2 pads per hour for an hour or more If you bleed so much that you feel like you might pass out or you do pass out If you have significant abdominal pain that is not improved with Tylenol    Safe Medications in Pregnancy   Acne: Benzoyl Peroxide Salicylic Acid  Backache/Headache: Tylenol: 2 regular strength every 4 hours OR              2 Extra strength every 6 hours  Colds/Coughs/Allergies: Benadryl (alcohol free) 25 mg every 6 hours as needed Breath right strips Claritin Cepacol throat lozenges Chloraseptic throat spray Cold-Eeze- up to three times per day Cough drops, alcohol free Flonase (by prescription only) Guaifenesin Mucinex Robitussin DM (plain only, alcohol free) Saline nasal spray/drops Sudafed (pseudoephedrine) & Actifed ** use only after [redacted] weeks gestation and if you do not have high blood pressure Tylenol Vicks Vaporub Zinc lozenges Zyrtec   Constipation: Colace Ducolax suppositories Fleet enema Glycerin suppositories Metamucil Milk of magnesia Miralax Senokot Smooth move tea  Diarrhea: Kaopectate Imodium A-D  *NO pepto Bismol  Hemorrhoids: Anusol Anusol HC Preparation H Tucks  Indigestion: Tums Maalox Mylanta Zantac  Pepcid  Insomnia: Benadryl (alcohol free) 25mg every 6 hours as needed Tylenol PM Unisom, no Gelcaps  Leg Cramps: Tums MagGel  Nausea/Vomiting:  Bonine Dramamine Emetrol Ginger extract Sea bands Meclizine  Nausea medication to take during pregnancy:  Unisom (doxylamine succinate 25 mg tablets) Take one tablet daily at bedtime. If symptoms are not adequately controlled, the dose can be increased to a maximum recommended dose of two tablets daily (1/2 tablet in the morning, 1/2 tablet mid-afternoon and one at bedtime). Vitamin B6 100mg tablets. Take one tablet twice a day (up to 200 mg per day).  Skin Rashes: Aveeno  products Benadryl cream or 25mg every 6 hours as needed Calamine Lotion 1% cortisone cream  Yeast infection: Gyne-lotrimin 7 Monistat 7  Gum/tooth pain: Anbesol  **If taking multiple medications, please check labels to avoid duplicating the same active ingredients **take medication as directed on the label ** Do not exceed 4000 mg of tylenol in 24 hours **Do not take medications that contain aspirin or ibuprofen    

## 2022-10-19 NOTE — MAU Note (Signed)
Alexandra Lowery is a 27 y.o. at 91w5dhere in MAU reporting: lower abdominal cramping that began 1 week ago, cramping is similar to period cramping.  Also reports pelvic pain that causes pain with ambulation.  Last took Tylenol yesterday, no relief.  Denies VB, had spotting approximately 2 weeks ago.  First PNV isn't until April, hasn't been seen. LMP: NA Onset of complaint: 1 week Pain score: 7 Vitals:   10/19/22 1533  BP: 104/60  Pulse: 80  Resp: 18  Temp: 98.4 F (36.9 C)  SpO2: 100%     FHT:151 bpm Lab orders placed from triage:   UA

## 2022-10-22 LAB — GC/CHLAMYDIA PROBE AMP (~~LOC~~) NOT AT ARMC
Chlamydia: NEGATIVE
Comment: NEGATIVE
Comment: NORMAL
Neisseria Gonorrhea: NEGATIVE

## 2022-10-31 ENCOUNTER — Other Ambulatory Visit: Payer: Self-pay | Admitting: Family

## 2022-10-31 DIAGNOSIS — A6004 Herpesviral vulvovaginitis: Secondary | ICD-10-CM

## 2022-10-31 NOTE — Telephone Encounter (Signed)
Are directions on medication to remain the same?

## 2022-10-31 NOTE — Telephone Encounter (Signed)
Patient has no upcoming appointment.please call patient to make an appointment.

## 2022-11-21 ENCOUNTER — Telehealth (INDEPENDENT_AMBULATORY_CARE_PROVIDER_SITE_OTHER): Payer: Medicaid Other

## 2022-11-21 DIAGNOSIS — O0990 Supervision of high risk pregnancy, unspecified, unspecified trimester: Secondary | ICD-10-CM | POA: Insufficient documentation

## 2022-11-21 DIAGNOSIS — Z3689 Encounter for other specified antenatal screening: Secondary | ICD-10-CM

## 2022-11-21 DIAGNOSIS — O0992 Supervision of high risk pregnancy, unspecified, second trimester: Secondary | ICD-10-CM

## 2022-11-21 MED ORDER — BLOOD PRESSURE KIT DEVI: 1.0000 | 0 refills | Status: DC | PRN

## 2022-11-21 NOTE — Patient Instructions (Signed)
Safe Medications in Pregnancy   Acne:  Benzoyl Peroxide  Salicylic Acid   Backache/Headache:  Tylenol: 2 regular strength every 4 hours OR               2 Extra strength every 6 hours   Colds/Coughs/Allergies:  Benadryl (alcohol free) 25 mg every 6 hours as needed  Breath right strips  Claritin  Cepacol throat lozenges  Chloraseptic throat spray  Cold-Eeze- up to three times per day  Cough drops, alcohol free  Flonase (by prescription only)  Guaifenesin  Mucinex  Robitussin DM (plain only, alcohol free)  Saline nasal spray/drops  Sudafed (pseudoephedrine) & Actifed * use only after [redacted] weeks gestation and if you do not have high blood pressure  Tylenol  Vicks Vaporub  Zinc lozenges  Zyrtec   Constipation:  Colace  Ducolax suppositories  Fleet enema  Glycerin suppositories  Metamucil  Milk of magnesia  Miralax  Senokot  Smooth move tea   Diarrhea:  Kaopectate  Imodium A-D   *NO pepto Bismol   Hemorrhoids:  Anusol  Anusol HC  Preparation H  Tucks   Indigestion:  Tums  Maalox  Mylanta  Zantac  Pepcid   Insomnia:  Benadryl (alcohol free) 25mg every 6 hours as needed  Tylenol PM  Unisom, no Gelcaps   Leg Cramps:  Tums  MagGel   Nausea/Vomiting:  Bonine  Dramamine  Emetrol  Ginger extract  Sea bands  Meclizine  Nausea medication to take during pregnancy:  Unisom (doxylamine succinate 25 mg tablets) Take one tablet daily at bedtime. If symptoms are not adequately controlled, the dose can be increased to a maximum recommended dose of two tablets daily (1/2 tablet in the morning, 1/2 tablet mid-afternoon and one at bedtime).  Vitamin B6 100mg tablets. Take one tablet twice a day (up to 200 mg per day).   Skin Rashes:  Aveeno products  Benadryl cream or 25mg every 6 hours as needed  Calamine Lotion  1% cortisone cream   Yeast infection:  Gyne-lotrimin 7  Monistat 7    **If taking multiple medications, please check labels to avoid  duplicating the same active ingredients  **take medication as directed on the label  ** Do not exceed 4000 mg of tylenol in 24 hours  **Do not take medications that contain aspirin or ibuprofen             [place] Pediatric Providers  Central/[place]outheast [place] ([address]) [facility] Brown, MD; Chambliss, MD; Eniola, MD; Hensel, MD; McDiarmid, MD; McIntyer, MD [address] [place], [place] [address] [phone] Mon-Fri 8:30-12:30, 1:30-5:00  Providers come to see babies during newborn hospitalization Only accepting infants of Mother's who are seen at Family Medicine Center or have siblings seen at   [facility] Center Medicaid - Yes; Tricare - Yes   [facility] Mulberry, MD [address] [place], [place] [address] [phone] Mon, Tue, Thur, Fri 8:30-5:00, Wed 10:00-7:00 (closed 1-2pm daily for lunch) Takes [place] residents with no insurance.  [place] [facility] only with Medicaid/insurance; Tricare - no  [facility] Ben-Davies, MD; Brown, MD; Chandler, MD; Ettefagh, MD; Grant, MD; Hanvey, MD; Herrin, MD; Jones,  MD; Lester, MD; McCormick, MD; McQueen, MD; Simha, MD; Stanley, MD; Stryffeler, NP [address], [place], [place] [address] [phone] Mon, Tue, Thur, Fri 8:30-5:30, Wed 9:30-5:30, Sat 8:30-12:30 Only accepting infants of first-time parents or siblings of   current patients Hospital discharge coordinator will make follow-up appointment Medicaid - yes; Tricare - yes  [place] ([address]) [facility] Cox, MD; Davis, MD; Dovico, MD; Ettefaugh, MD; Lowe, MD; Nation, MD; Slimp, MD; Sumner, MD; Williams, MD [address], [place], [place] [address] [phone] Mon-Fri 8:30-5:00, closed for lunch 12:30-1:30; Sat-Sun 10:00-1:00 Accepting Newborns with commercial insurance only, must call prior  to delivery to be accepted into  practice.  Medicaid - no, Tricare - yes   [facility] [address] [place], [place] [address] [phone] or [phone] Mon to Fri 8am to 10pm, Sat 8am to 1pm (virtual only on weekends) Only accepts Medicaid Healthy Blue pts  [facility]  Artis, MD; Coccaro, MD; [place] Gardner, MD; Netherton, NP; Roper, MD; Wilmot, PA-C; Skinner, MD [address] [place], [place] [address] [phone] Mon-Fri 8:30-5:30 Medicaid - yes, Tricare - yes  [place] ([address]) [facility] Warner, MD [address], [place], [place] [address] [phone] Mon, Tues, Wed Fri 8:30-5:00, Sat 8:30-12:00, Closed Thursdays Accepting siblings of established patients and first time mom's if you call prenatally Medicaid- yes; Tricare - yes  [facility] Becker, PA; Hagler, MD; Quinn, PA-C; Scifres, PA; Sun, MD; Swayne, MD;  [address], [place], [place] [address] [phone] Mon-Fri 8:30-5:00, closed for lunch 1-2 Only accepting newborns of established patients Medicaid- no; Tricare - yes  [place] ([address]) [facility] Timberlake, MD; [address], [place], [place] [address] [phone] Mon-Fri 8:00-5:00 Medicaid - No; Tricare - Yes  [facility] [employer]  Brake, NP; Wharton, PA [address], [place], [place] [address] [phone] Mon-Fri 8:00-5:00 Medicaid - No, Tricare - Yes  [facility] Gay, MD; Quinlan, MD; Blatt, DNP [address] [place], [place] [address] [phone]  Mon-Fri 8:00-5:00 Medicaid - No; Tricare - Yes  [facility] [address] [place], [place] [address] [phone] Mon-Fri 8:30-5:00 (lunch 12:00-1:00) Medicaid -Yes; Tricare - Yes  [facility] Jordan, MD [address], [place], [place] [address] [phone] Mon-Fri 8:00-5:00 Seeing newborns of current patients only. No new patients Medicaid - No, Tricare - yes  [facility] Parker, MD [address] [place], [place] [address] [phone] Mon-Fri 8:00-5:00 Medicaid -yes as secondary coverage only; Tricare - yes  [facility] Brecken, PA; Christy, NP; Dees, MD; DeClaire, MD; DeWeese, MD; Hodge, PA; Smoot, NP; Summer, MD; Vapne, MD [address] [place], [place] [address] [phone] Mon-Fri 8:30-5:00, Sat 9:00-11:00 Accepts commercial insurance ONLY. Offers free prenatal information sessions for families. Medicaid - No, Tricare - Call first  [facility] Bouska, MD; Gordon, PA; Jeffery, PA; Weber, PA [address] [place] [address] [phone] Mon-Fri 7:30-5:30 Medicaid - Yes; Tricare - yes  [place] ([address] & [address])  [facility] Reese, MD [address] [place], [place] [address] [phone] Mon-Thur 8:00-6:00, closed for lunch 12-2, closed Fridays Medicaid - yes; Tricare - no  [facility] Anderson, NP; Badger, MD; Beal, PA; Spencer, PA [address], [place],  [address] [phone] Mon-Fri 7:30-4:30 Medicaid - yes, Tricare - yes  [facility]  Agbuya, MD; Klett, NP; Romgoolam, MD; Rothstein, NP [address], ,  [address] [phone] Mon-Fri 8:30-5:00, closed for lunch 1-2, Sat 8:30-12:00 - sick visits only Providers come to   see babies at [facility] Only accepting newborns of siblings and first time parents ONLY if who have met with office prior to delivery Medicaid -Yes; Tricare - yes  [facility] [place]  Golden, DO; Friddle, NP; Wallace, MD; Wood, MD:  [address], [place], [place] [address] [phone] Mon- Fri 8:00-5:00, Sat 9:00-12:00 -  sick visits only Accepting siblings of established patients and first time mom/baby Medicaid - Yes; Tricare - yes Patients must have vaccinations (baby vaccines)  [place] ([address] & [address])  [facility] [address] [place], [place] [address] [phone] Mon-Fri 8:00-5:00 Medicaid - no; Tricare - yes  [facility] Briscoe, MD; Schmidt, PA; Moreira, PA [address], [place], [place] [address] [phone] Mon-Fri 8:00-5:00 Medicaid- yes; Tricare - yes  [facility] Boyd, MD; Jones, NP; Osborn, PA [address], [place], [place] [address] [phone] Mon-Fri 8:00-5:00 Medicaid - Yes; Tricare - yes  [place] ([address])  [facility] Atkinson, PA; Calderon, PA; Cummings, MD; Dillard, MD; Henrish, NP; Isenhour, DO; Martin, PA; Olson, MD; Ott, MD; Phillips, MD; Valente, PA; VanDeven, PA; Yonjof, NP [address], [place], [place] [address] [phone] Mon-Fri 8:30-5:00, Sat 9:00-12:00 - sick only Please register online [email or web address] then schedule online or call office Medicaid-Yes; Tricare -yes  [facility]  Dabrusco, MD; Dial, MD; Yauco, MD; Fleenor, NP; Goolsby, PA; Tonuzi, MD; Turner, NP; West, MD [address], [place], [place] [address] [phone] Mon-Fri 8:00-5:30, Sat&Sun by appointment (phones open at 8:30) Medicaid - Yes; Tricare - yes  [place] ([address]) [facility] Allen, CPNP; Bates, MD; Gordon, MD; Mills, NP; Weinshilboum, DO [address], [place], [place] [address] [phone] M-F 8:00 - 5:15, Sat/Sun 9-12 sick visits only Medicaid - No; Tricare - yes  [facility] [place] [facility]  Brown, PA-C; Cowen, PA-C; Dennis, DO; Fuster, PA-C; Martin, PA-C;  Shelton, PA-C; Spry, MD [address] [place] [address] [place], [place] [address] [phone] Mon-Thur 8:00-7:00, Fri 8:00-5:00 Accepting Medicaid for 13 and under only   [facility] [place] [facility] [place] Hayes, FNP; List, FNP; Moran, MD; Pitonzo, PA-C; Scholer, MD; Spangle, FNP; Nzenwa, FNP; Jasper, MD; Moran, MD [address] [place], [place] [address] [phone] Mon-Fri 8:30-5:30 Medicaid - Yes; Tricare - yes  [facility] [address] [facility]  Kelly, CPNP; Logan, MD; Poth, MD; Ramadoss, MD; Staton, NP [address], [place], [place] [address] [phone] Mon-Thur 8:00-5:30, Fri 8:00-5:00, Sat 9:00-12:00 Medicaid - yes, Tricare - yes  [place] ([address])  [facility] [place] Masneri, DO; Meyers, MD; Nelson, PA [address], [place], [place] [address] [phone] Mon-Fri 8:00-5:00, closed for lunch 12-1 Medicaid - No; Tricare - yes  [facility] [place] McGowen, MD [address], [place], [place] [address] [phone] Mon-Fri 8:00-5:00 Medicaid - No; Tricare - yes  [facility] [place] MacDonald, MD; Nayak, MD; Kearns, MD; Jones, MD [address] [place] [address], [place], [place] [address] [phone] Mon-Fri 8:00-5:00 Medicaid- Yes; Tricare - yes  [place] ([address])  [facility] [place] [facility] Martin, PA-C; Tabori, MD [address], [place],    [address] [phone] Mon-Fri 8:00-5:00 Medicaid - No; Tricare - yes  [facility] [place]  Margin - CPNP [address], [place], [place] [address] [phone] Mon-Weds 8:00-6:00, Thurs-Fri 8:00-5:00, Sat 9:00-12:00 Medicaid - yes; Tricare - yes   [facility] [place] Aubuchon, MD; Brandon, PA [address] [place], [place] [address] [phone] Mon-Fri 8:00-5:00 Medicaid - yes; Tricare - yes  [place] Pediatric Providers  [facility] [address], [place], [place] [address] [phone] M, Thur: 8am -8pm, Tues, Weds: 8am - 5pm; Fri: 8-1 Medicaid - Yes; Tricare - yes  [facility] Mertz, MD; Johnson, MD; Wells, MD; Downs, PA; Hockenberger, PA [address], [place], [place] [address] [phone] M-F 8:30 - 5:00 Medicaid - Call office; Tricare -yes  [facility] Bonney, MD; Page, MD, Minter, MD; Mueller, PNP; Thomason, NP [address], [place], [place] [address] [phone] M-F 8:30 - 5:00, Sat/Sun 8:30 - 12:30 (sick visits) Medicaid - Call office; Tricare -yes  [facility] Lewis, MD; Shaub, PNP; Boylston, MD; Quaile, PA; Nonato, NP; Landon, CPNP [address], [place], [place] [address] [phone] M-F 8:30 - 5:00 Medicaid - Call office; Tricare - yes  [facility] [place] Cline, MD; Dvergsten, MD; Flores, MD; Kawatu, MD; Nogo, MD [address], [place], [place] [address] [phone] M-Thur: 8:00 - 5:00; Fri: 8:00 - 4:00 Medicaid - yes; Tricare - yes  [facility] [address] [place], [place], [place] [address] [phone] M-F: 8:30- 5:00, closed for lunch 12:30 - 1:00 Medicaid - yes; Tricare -yes  [facility] [place] [address], [place], [place] [address] [phone] M-F 8:00 - 5:00 Medicaid - yes; Tricare - yes  [facility] Johnson, DO; Rumball, DO; Wicker, NP [address] [place], [place], [place] [address] [phone] M-F 8:00 - 5:00, Closed 12-1 for lunch Medicaid - Call; Tricare - yes  [facility] Stein, MD [address], , [place] [address] [phone] M-F: 8:00-5:00, Sat: 8:00 - noon Medicaid - call; Tricare -yes  [place] Pediatric Providers  [facility] Collins, FNP-C [address], [place], [place] [address] [phone] M-W: 8:00-5:00, Thur:  8:00 - 7:00, Fri: 8:00 - noon Medicaid - yes; Tricare - yes  [facility] [place] Adams, FNP [address], [place], [place] [address] [phone] M-F 8:00 - 5:00, Closed for lunch 12-1 Medicaid - yes; Tricare - yes  [place] Pediatric Providers  [facility] Smith, FNP, Melvin, MD, Fay, FNP-C [address] [place] [address] [place], [place], [place] [address] [phone] M-T 8:00-5:00, Wed-Fri 7:00-6:00 Medicaid - Yes; Tricare -yes  [facility] [place] Civiletti, DO; [address], [place], [place] [address] [phone] M-F 8:00 - 5:00, closed for lunch 12-1 Medicaid - Yes; Tricare - yes  [facility]  Barnes, MD; Bergdolt, MD; Caulfield, MD; Emrich, MD; Fiscus, MD; Hoppens, MD; Kylstra, MD, McPherson, MD; Todd, MD; Prestwood, MD; Waters, MD; Wood, MD [address], [place], [place] [address] [phone] M-F 8:00-5:00 Medicaid - yes; Tricare - yes  [facility] Cheema, MD (speaks Punjabi and Hindi) [address] [place], [place] [address] [phone] M-F: 8:30 - 5:00, closed 12:30 - 1 for lunch Medicaid - Yes; Tricare -yes  [place] Pediatric Providers  [facility] Loda, MD; Tim[place], MD;   Burke, MD [address], [place], [place] [address] [phone] M-Th: 8:00 - 5:30, Fri: 8:00 - 12:00 Medicaid - yes; Tricare - yes  [facility] [place] Lookabill, NP; Meier, MD; Daffron, MD [address], [place], [place] [address] [phone] M-F: 8:00 - 5:00 Medicaid - yes; Tricare - yes  [facility] Busse, NP; Bowman, NP; Baune, NP; Entwistle, MD; Williams, MD, Huffman, NP, Ferguson, MD; Patel, DO [address], [place], [place] [address] [phone] M-F: 8:30 - 5:30p Medicaid - yes; Tricare - yes Other locations available as well  [place]  [facility] Rajan, MD; Wilson, MD; Morgan, PA-C, Domenech, PA-C; Myers, PA-C [address], [place], [place] [address] [phone] M-W: 8:00am - 7:00pm, Thurs: 8:00am - 8:00pm; Fri: 8:00am - 5:00pm, closed daily from 12-1 for lunch Medicaid - yes; Tricare - yes  [place] [facility] Providers  [facility] [place] Adams, MD; Crystal, FNP; Hadley, MD; Stokes, MD; Johnson, PNP; Brady, PA-C; West, PNP; Gardner, MD;  [address], [place], [place] [address] [phone] M - Fri: 8am - 5pm, Sat 9-noon Medicaid - Yes; Tricare -yes  [facility] [place] Nayak, MD; Jones, FNP; McDonald, MD; Kearns, MD [address] [place] [address], [place], [place] [phone] M-F 8:00 - 5:00 Medicaid - call; Tricare - yes  [facility] [place] Bell, MD; Emory, PNP; Pinder, MD; Anderson, MD; Light, PA-C; Johnson, MD; Latta, MD; Saul, PNP; Rainey, MD; Clifford, MD; McClung, MD [address], [place], [place] [address] [phone] M-F 8:00am - 5:00pm; Sat. 9:00 - 11:00 Medicaid - yes; Tricare - yes  [facility] [place] Soldato-Couture, MD [address], [place], [place] [address] [phone] M-F 8:00 - 5:00 Medicaid - [place] Medicaid only; Tricare - yes  [facility] [place] Walker, MD; Davis, PNP; Ajizian, MD [address] [place] [address], [place], [place] [address] [phone] M-F 8:00 - 5:00 Medicaid - yes; Tricare - yes  [facility] [place] Barry, MD; Brown, MD, [place], MD, Hazek, MD; Hoyle, MD; Smith, MD; [address] [place], [place], [place] [address] [phone] M-F: 8-5 Medicaid - yes; Tricare - yes  [facility] [place] Brady, Md; Dowlen, MD; [address] [place] [address], [place], [place] [address] [phone] M-F 8-5 Medicaid - yes; Tricare - yes  [facility] Kearns, MD; Nayak, MD; Soldato-Courture, MD; Pellam-Palmer,  DNP; Herring, PNP [address], [place], [place] [address] [phone] M-F 8-5 Medicaid - yes; Tricare - yes  [facility] Weathers, MD; Merritt, PA-C; Davis-PA-C; Warn[place]ont, MD [address], [place], [place] [address] [phone] M-F 7am - 5 pm Medicaid - call; Tricare - yes  [facility] Hill, PNP; Erickson, MD; Robinson, MD [address], [place], [place] [address] [phone] M-F 8-5 Medicaid - yes; Tricare - yes  [facility] Kribbs, MD; Warner, MD; Williams, FNP; Brooks, FNP; Boles, FNP; Romblad, PA-C; [place] - FNP [address], [place], [place] [address] [phone] M-F 8-5 Medicaid- yes; Tricare - yes  [facility] [place] [place] [place] [facility] [place] Yoder, MD; Verenes, MD; Armentrout, MD; Stewart, MD; Beasley, CPNP; [place], MD; Erickson, MD; Rice, MD [address], [place],  [address] [phone] M-F: 8-5, Sat: 9-4, Sun 9-12 Medicaid - yes; Tricare - yes  [facility] Little, PNP; Davis, PNP [address], [place], [place] [address] [phone] M-F 8 - 5, closed 12-1 for lunch Medicaid - yes; Tricare - yes  [facility] Friesen, MD; Cnegia, MD; Rice, MD; Patel, DO [address], [place], [place] [address] [phone] M-F 8- 5:30 Medicaid - yes; Tricare - yes  [facility] [place] Zvolensky, MD; Ray, MD; Haas, MD [address] [address], [place], [place] [address] [phone] M: 8-7; Tues-Fri: 8-5; Sat: 9-12 Medicaid - yes; Tricare - yes  [facility] [place] Heinrich, MD; Meyer, MD; Clark, MD; Rhyne, MD; Aubuchon, MD [address], [place], [place] [address] [phone] M: 8-7; Tues-Fri: 8-5; Sat: 8:30-12:30 Medicaid -  yes; Tricare - yes  [facility] [place] Bista, MD; Dillard, PA [address], [place], [place] [address] [phone] Mon-Fri: 8-5 Medicaid - yes; Tricare - yes  [facility] [place] Beasley, CPNP; Mahle, CPNP; Rice, MD; Duffy, MD; Culler, MD; [address] [place], [place] [address] [phone] M-F: 8-5, closed 1-2 for lunch Medicaid - yes; Tricare - yes  [facility] [place] [facility] Rickman, PA; Mounce, NP; Smith, MD; Jordan, CPNP; Darty, PA; Ball, MD; Wallace, MD [address] [place] [address], [place], [place] [address] [phone] M-Thurs: 8-7; Fri: 8-6; Sat: 9-12; Sun 2-4 Medicaid - yes; Tricare - yes  [facility] Brown, MD; Shin, MD; Goodman, DNP, FNP; Sebesta, DO; [address], [place], [place] [address] [phone] M-F: 8-5 Medicaid - yes; Tricare - yes  [place] Pediatric Providers  [facility] Dough, MD; Welsh, NP [address], [place], [place] [address] [phone] M - Fri: 8am - 5pm, closed for lunch 12-1 Medicaid - Yes; Tricare - yes  [facility] Manandhar, MD; Riley, MD; Sanger, DO; Vinocur, MD;Hall, PA; Walsh, PA; Deshmukh, NP [address], [place] [address] [phone] M-F 8:00 - 5:00, Sat 8:00 - 11:30 Medicaid - yes; Tricare - yes  [facility] Khan, MD; Redding, MD, Street, MD, Holt, MD, Burgart, MD; Rhyne, NP; Dickinson, PA;  [address] [place] [address], [place], [place] [address] [phone] M-F 8:10am - 5:00pm Medicaid - yes; Tricare - yes  [facility] Connors, MD; Kime, NP [address], [place], [place] [address] [phone] M-F 8:00 - 5:00 Medicaid - [place] Medicaid only; Tricare - yes  [facility] [place] Whyte, MD; Fox, NP [address], [place], [place] [address] [phone] M-F 8:00 - 5:00; Closed for lunch 12 - 1:00 Medicaid - yes; Tricare - yes  [facility] Penner, MD; Wilburn, FNP [address], [place], [place] [address] [phone] Mon 9-5; Tues/Wed 10-5; Thurs 8:30-5; Fri: 8-12:30 Medicaid - yes; Tricare - yes  [place] Pediatric Providers  [facility]  Golding, MD; Jackson, PA-C [address], [place], [place] [address] [phone] phone [phone] fax M-F 7:15 - 4:30 Medicaid - yes; Tricare - yes  [facility] Gosrani, MD; Meccariello, DO [address] [place],  [address] [phone] M-Fri: 8:30 - 5:00, closed for lunch everyday noon - 1pm Medicaid - Yes; Tricare - yes  [facility] Burdine, MD; Daniel, MD; Howard, MD; Sasser, MD; Boles, PA; Boyd, PA-C; Carroll, PA; McGee, PA;   Skillman, PA; Wilson, PA [address] [place], [place] [address] [phone] M-Thurs: 7:30am - 7:00pm; Friday 7:30am - 4pm; Sat: 8:00 - 1:00 Medicaid - Yes; Tricare - yes  [facility] [place] Akhbari, MD; Law, MD; Qayumi, MD; Salvador, DO [address], [place], [place] [address] [phone] M-Thur: 8:00 - 5:00, Fri: 8:00 - Noon Medicaid - yes; Tricare - yes No [place] [employer]  [facility] [place] [facility] Dettinger, MD; Gottschalk, DO; Hawks, NP; Martin, NP; Morgan, NP; Milian, NP; Rakes, NP; Stacks, MD; Webster, PA [address], [place], [place] [address] [phone] M-F 8:00 - 5:00 Medicaid - yes; Tricare - yes  [facility] Collins, FNP-C; Bucio, FNP-C [address], [place], [place] [address] [phone] M, W, R 8:00-5:00, Tues: 8:00am - 7:00pm; Fri 8:00 - noon Medicaid - Yes; Tricare - yes  [facility] Khan, MD [address]  [place] [address] [place],  [address] [phone]  M-Thurs 8:30-5:30, Fri: 8:30-12:30pm Medicaid - Yes; Tricare - N      Considering Waterbirth? Guide for patients at [facility] Why consider waterbirth? Gentle birth for babies  Less pain medicine used in labor  May allow for passive descent/less pushing  May reduce perineal tears  More mobility and instinctive maternal position changes  Increased maternal relaxation   Is waterbirth safe? What are the risks of infection, drowning or other complications? Infection:  Very low risk (3.7 % for tub vs 4.8% for bed)  7 in 8000 waterbirths with documented infection  Poorly cleaned equipment most common cause  Slightly lower group B strep transmission rate  Drowning  Maternal:  Very low risk  Related to seizures or fainting  Newborn:  Very low risk. No evidence of increased risk of respiratory problems in multiple large studies  Physiological protection from breathing under water  Avoid underwater birth if there are any fetal complications  Once baby's head is out of the water, keep it out.  Birth complication  Some reports of cord trauma, but risk decreased by bringing baby to surface gradually  No evidence of increased risk of shoulder dystocia. Mothers can usually change positions faster in water than in a bed, possibly aiding the maneuvers to free the shoulder.   There are 2 things you MUST do to have a waterbirth with [facility]: Attend a waterbirth class at [facility] [place]   3rd Wednesday of every month from 7-9 pm (virtual during COVID) Free Register online at [email or web address] or [email or web address] or by calling [phone] Bring us the certificate from the class to your prenatal appointment or send via MyChart Meet with a midwife at 36 weeks* to see if you can still plan a waterbirth and to sign the consent.   *We also recommend that you schedule as many of  your prenatal visits with a midwife as possible.    Helpful information: You may want to bring a bathing suit top to the hospital to wear during labor but this is optional.  All other supplies are provided by the hospital. Please arrive at the hospital with signs of active labor, and do not wait at home until late in labor. It takes 45 min- 1 hour for fetal monitoring, and check in to your room to take place, plus transport and filling of the waterbirth tub.    Things that would prevent you from having a waterbirth:   Premature, <37wks  Previous cesarean birth  Presence of thick meconium-stained fluid  Multiple gestation (Twins, triplets, etc.)  Uncontrolled diabetes or gestational diabetes requiring medication  Hypertension diagnosed in pregnancy or preexisting hypertension (gestational hypertension, preeclampsia, or chronic hypertension) Fetal growth restriction (your baby measures less than 10th percentile on ultrasound) Heavy vaginal bleeding  Non-reassuring fetal heart rate  Active infection (MRSA, etc.). Group B Strep is NOT a contraindication for waterbirth.  If your labor has to be induced and induction method requires continuous monitoring of the baby's heart rate  Other risks/issues identified by your obstetrical provider   Please remember that birth is unpredictable. Under certain unforeseeable circumstances your provider may advise against giving birth in the tub. These decisions will be made on a case-by-case basis and with the safety of you and your baby as our highest priority.     

## 2022-11-21 NOTE — Progress Notes (Signed)
New OB Intake  I connected with Alexandra Lowery  on 11/21/22 at 10:15 AM EDT by MyChart Video Visit and verified that I am speaking with the correct person using two identifiers. Nurse is located at [facility] and pt is located in car for privacy.  I discussed the limitations, risks, security and privacy concerns of performing an evaluation and management service by telephone and the availability of in person appointments. I also discussed with the patient that there may be a patient responsible charge related to this service. The patient expressed understanding and agreed to proceed.  I explained I am completing New OB Intake today. We discussed EDD of 04/28/2023 that is based on LMP of 08/22/2022. Pt is G3/P2. I reviewed her allergies, medications, Medical/Surgical/OB history, and appropriate screenings. I informed her of [facility] services. [facility] information placed in AVS. Based on history, this is a high risk pregnancy.  Patient Active Problem List   Diagnosis Date Noted   Symptomatic anemia 01/31/2022   Abnormal uterine bleeding 01/31/2022   Chlamydia infection affecting pregnancy in first trimester 01/22/2017    Concerns addressed today  Delivery Plans Plans to deliver at [facility]. Patient given information for [facility] Healthy [facility] website for more information about [facility]. Patient is interested in water birth. Offered upcoming OB visit with CNM to discuss further.  MyChart/Babyscripts MyChart access verified. I explained pt will have some visits in office and some virtually. Babyscripts instructions given and order placed. Patient verifies receipt of registration text/e-mail. Account successfully created and app downloaded.  Blood Pressure Cuff/Weight Scale Blood pressure cuff ordered for patient to pick-up from [employer]. Explained after first prenatal appt pt will check weekly and document in Babyscripts.  Anatomy US Explained first scheduled [place] will be around 19  weeks. Anatomy US scheduled for 12/31/2022 at 9:30am. Pt notified to arrive at 9:45am.  Labs Discussed Avelina Laine genetic screening with patient. Would like both Panorama and Horizon drawn at new OB visit. Routine prenatal labs needed.  COVID Vaccine Patient has had COVID vaccine.   Is patient a CenteringPregnancy candidate?  Unsure about centering   Is patient a Mom+Baby Combined Care candidate?  Not a candidate   If accepted, Mom+Baby staff notified  Social Determinants of Health Food Insecurity: Patient denies food insecurity. WIC Referral: Patient is interested in referral to [facility].  Transportation: Patient denies transportation needs. Childcare: Discussed no children allowed at ultrasound appointments. Offered childcare services; patient declines childcare services at this time.  Interested in [place]? If yes, send referral and doula dot phrase.   First visit review I reviewed new OB appt with patient. I explained they will have a provider visit that includes new ob labs and listening to baby heartbeat. Explained pt will be seen by Dr. Vergie Living at first visit; encounter routed to appropriate provider. Explained that patient will be seen by pregnancy navigator following visit with provider.   Lowry Bowl, CMA 11/21/2022  9:36 AM

## 2022-11-26 ENCOUNTER — Other Ambulatory Visit: Payer: Self-pay

## 2022-11-26 ENCOUNTER — Ambulatory Visit (INDEPENDENT_AMBULATORY_CARE_PROVIDER_SITE_OTHER): Payer: Medicaid Other | Admitting: Obstetrics and Gynecology

## 2022-11-26 ENCOUNTER — Other Ambulatory Visit (HOSPITAL_COMMUNITY)
Admission: RE | Admit: 2022-11-26 | Discharge: 2022-11-26 | Disposition: A | Discharge status: Home / Self Care | Payer: Medicaid Other | Source: Ambulatory Visit | Attending: Obstetrics & Gynecology | Admitting: Obstetrics & Gynecology

## 2022-11-26 VITALS — BP 119/71 | HR 103 | Wt 184.6 lb

## 2022-11-26 DIAGNOSIS — Z3A18 18 weeks gestation of pregnancy: Secondary | ICD-10-CM

## 2022-11-26 DIAGNOSIS — Z3A19 19 weeks gestation of pregnancy: Secondary | ICD-10-CM

## 2022-11-26 DIAGNOSIS — N819 Female genital prolapse, unspecified: Secondary | ICD-10-CM

## 2022-11-26 DIAGNOSIS — O0932 Supervision of pregnancy with insufficient antenatal care, second trimester: Secondary | ICD-10-CM

## 2022-11-26 DIAGNOSIS — O99012 Anemia complicating pregnancy, second trimester: Secondary | ICD-10-CM

## 2022-11-26 DIAGNOSIS — D509 Iron deficiency anemia, unspecified: Secondary | ICD-10-CM

## 2022-11-26 DIAGNOSIS — Z8619 Personal history of other infectious and parasitic diseases: Secondary | ICD-10-CM

## 2022-11-26 NOTE — Progress Notes (Signed)
New OB Note  11/26/2022   Clinic: [facility]  Chief Complaint: new OB  Transfer of Care Patient: no  History of Present Illness: Alexandra Lowery is a 27 y.o. G3P2002 at 18/1 weeks (EDC 9/155, based on Patient's last menstrual period was 07/22/2022.).  Preg complicated by has Symptomatic anemia; Abnormal uterine bleeding; Pregnancy, supervision, high-risk; Late prenatal care affecting pregnancy in second trimester; History of herpes genitalis; and Female genital prolapse on their problem list.   No miscarriage s/s. Pt went to [facility] for lower back discomfort and notes some occasionally.  She also notes that sometimes when she wipes she "feels something down there". She states it started with her last pregnancy.   ROS: A 12-point review of systems was performed and negative, except as stated in the above HPI.  OBGYN History: As per HPI. OB History  Gravida Para Term Preterm AB Living  3 2 2     2   SAB IAB Ectopic Multiple Live Births          2    # Outcome Date GA Lbr Len/2nd Weight Sex Delivery Anes PTL Lv  3 Current           2 Term 09/18/17 [redacted]w[redacted]d   M Vag-Spont EPI  LIV  1 Term 12/15/12 [redacted]w[redacted]d 08:27 / 00:26 6 lb 10.7 oz (3.025 kg) F Vag-Spont EPI  LIV   Any issues with any prior pregnancies: no Prior children are healthy, doing well, and without any problems or issues: yes    Past Medical History: Past Medical History:  Diagnosis Date   Anemia    Chlamydia 01/18/2017   Gonorrhea 2015 and 2017   HPV in female    Hx of migraines     Past Surgical History: Past Surgical History:  Procedure Laterality Date   WISDOM TOOTH EXTRACTION      Family History:  Family History  Problem Relation Age of Onset   Healthy Mother    Healthy Father    Hypertension Maternal Grandmother     Social History:  Social History   Socioeconomic History   Marital status: Single    Spouse name: Not on file   Number of children: Not on file   Years of  education: Not on file   Highest education level: Not on file  Occupational History   Not on file  Tobacco Use   Smoking status: Never   Smokeless tobacco: Never  Vaping Use   Vaping Use: Every day  Substance and Sexual Activity   Alcohol use: Not Currently    Comment: Social   Drug use: No   Sexual activity: Not Currently    Partners: Male    Birth control/protection: None  Other Topics Concern   Not on file  Social History Narrative   Tobacco use, amount per day now:   Past tobacco use, amount per day:   How many years did you use tobacco:   Alcohol use (drinks per week):   Diet:   Do you drink/eat things with caffeine:   Marital status:   Single                               What year were you married?   Do you live in a house, apartment, assisted living, condo, trailer, etc.? House   Is it one or more stories?   How many persons live in your home? 7  Do you have pets in your home?( please list) No   Highest Level of education completed? 12th Grade   Current or past profession:   Do you exercise? No                                 Type and how often?   Do you have a living will? No   Do you have a DNR form?    No                               If not, do you want to discuss one?   Do you have signed POA/HPOA forms?  No                      If so, please bring to you appointment      Do you have any difficulty bathing or dressing yourself? No    Do you have any difficulty preparing food or eating? No   Do you have any difficulty managing your medications? No   Do you have any difficulty managing your finances? No   Do you have any difficulty affording your medications? No   Social Determinants of Corporate investment banker Strain: Not on file  Food Insecurity: Not on file  Transportation Needs: Not on file  Physical Activity: Not on file  Stress: Not on file  Social Connections: Not on file  Intimate Partner Violence: Not on file    Allergy: No Known  Allergies  Current Outpatient Medications: Prenatal vitamin Physical Exam:   BP 119/71   Pulse (!) 103   Wt 184 lb 9.6 oz (83.7 kg)   LMP 07/22/2022   BMI 26.11 kg/m  Body mass index is 26.11 kg/m. Contractions: Not present Vag. Bleeding: None. Fundal height: not applicable FHTs: 140s  General appearance: Well nourished, well developed female in no acute distress.  Neck:  Supple, normal appearance, and no thyromegaly  Cardiovascular: S1, S2 normal, no murmur, rub or gallop, regular rate and rhythm Respiratory:  Clear to auscultation bilateral. Normal respiratory effort Abdomen: positive bowel sounds and no masses, hernias; diffusely non tender to palpation, non distended Breasts: pt denies any breast s/s. Neuro/Psych:  Normal mood and affect.  Skin:  Warm and dry.  Lymphatic:  No inguinal lymphadenopathy.   Pelvic exam: is not limited by body habitus EGBUS: within normal limits, Vagina: within normal limits and with no blood in the vault, Cervix: normal appearing cervix without discharge or lesions, closed/long/high, Uterus:  enlarged, c/w 18 week size, and Adnexa:  normal adnexa and no mass, fullness, tenderness  Laboratory: none  Imaging:  none  Assessment:  patient stable  Plan: 1. Late prenatal care affecting pregnancy in second trimester - AFP, Serum, Open Spina Bifida - Culture, OB Urine - CBC/D/Plt+RPR+Rh+ABO+RubIgG... - Hemoglobin A1c - Cytology - PAP( [place]) - Ambulatory referral to Physical Therapy - Ferritin  2. History of herpes genitalis Start prophylaxis at 34-36wks  3. Female genital prolapse, unspecified type Normal exam today.  D/w her that it could be cervical prolapse but cervix doesn't even seem low today but I told her that it's a morning visit and s/s can wax and wane based on her day. I d/w her to avoid constipation, which she states she has no problems with and pelvic floor PT would be very helpful  which she is amenable to.   4. [redacted]  weeks gestation of pregnancy Routine care. Already scheduled for may anatomy u/s.  - AFP, Serum, Open Spina Bifida - Culture, OB Urine - CBC/D/Plt+RPR+Rh+ABO+RubIgG... - Hemoglobin A1c - Cytology - PAP( [place]) - Ambulatory referral to Physical Therapy  Problem list reviewed and updated.  Follow up in 3 weeks.  >50% of 30 min visit spent on counseling and coordination of care.     Cornelia Copa MD Attending Center for [facility])

## 2022-11-27 LAB — FERRITIN: Ferritin: 6 ng/mL — ABNORMAL LOW (ref 15–150)

## 2022-11-28 ENCOUNTER — Telehealth: Payer: Self-pay

## 2022-11-28 LAB — URINE CULTURE, OB REFLEX: Organism ID, Bacteria: NO GROWTH

## 2022-11-28 LAB — CULTURE, OB URINE

## 2022-11-28 NOTE — Telephone Encounter (Signed)
Calling for results. States labs were low and she is now feeling some symptoms.

## 2022-11-29 LAB — CYTOLOGY - PAP
Adequacy: ABSENT
Chlamydia: NEGATIVE
Comment: NEGATIVE
Comment: NORMAL
Diagnosis: NEGATIVE
Diagnosis: REACTIVE
Neisseria Gonorrhea: NEGATIVE

## 2022-11-29 NOTE — Telephone Encounter (Signed)
Left message that I am returning her call if she continues to have questions or concerns to please give the office a call back.   Addison Naegeli, RN

## 2022-11-30 ENCOUNTER — Encounter: Payer: Self-pay | Admitting: Obstetrics & Gynecology

## 2022-12-01 LAB — CBC/D/PLT+RPR+RH+ABO+RUBIGG...
Antibody Screen: NEGATIVE
Basophils Absolute: 0 10*3/uL (ref 0.0–0.2)
Basos: 0 %
EOS (ABSOLUTE): 0 10*3/uL (ref 0.0–0.4)
Eos: 0 %
HCV Ab: NONREACTIVE
HIV Screen 4th Generation wRfx: NONREACTIVE
Hematocrit: 36.4 % (ref 34.0–46.6)
Hemoglobin: 11.1 g/dL (ref 11.1–15.9)
Hepatitis B Surface Ag: NEGATIVE
Immature Grans (Abs): 0 10*3/uL (ref 0.0–0.1)
Immature Granulocytes: 0 %
Lymphocytes Absolute: 1.2 10*3/uL (ref 0.7–3.1)
Lymphs: 17 %
MCH: 25.1 pg — ABNORMAL LOW (ref 26.6–33.0)
MCHC: 30.5 g/dL — ABNORMAL LOW (ref 31.5–35.7)
MCV: 82 fL (ref 79–97)
Monocytes Absolute: 0.4 10*3/uL (ref 0.1–0.9)
Monocytes: 5 %
Neutrophils Absolute: 5.4 10*3/uL (ref 1.4–7.0)
Neutrophils: 78 %
Platelets: 298 10*3/uL (ref 150–450)
RBC: 4.42 x10E6/uL (ref 3.77–5.28)
RDW: 15 % (ref 11.7–15.4)
RPR Ser Ql: NONREACTIVE
Rh Factor: POSITIVE
Rubella Antibodies, IGG: 4.2 index (ref >0.99)
WBC: 7 10*3/uL (ref 3.4–10.8)

## 2022-12-01 LAB — AFP, SERUM, OPEN SPINA BIFIDA
AFP MoM: 0.89
AFP Value: 37.8 ng/mL
Gest. Age on Collection Date: 18 weeks
Maternal Age At EDD: 26.8 yr
OSBR Risk 1 IN: 10000
Test Results:: NEGATIVE
Weight: 185 [lb_av]

## 2022-12-01 LAB — HCV INTERPRETATION

## 2022-12-01 LAB — HEMOGLOBIN A1C
Est. average glucose Bld gHb Est-mCnc: 94 mg/dL
Hgb A1c MFr Bld: 4.9 % (ref 4.8–5.6)

## 2022-12-02 ENCOUNTER — Encounter: Payer: Self-pay | Admitting: Obstetrics and Gynecology

## 2022-12-02 DIAGNOSIS — R790 Abnormal level of blood mineral: Secondary | ICD-10-CM | POA: Insufficient documentation

## 2022-12-02 DIAGNOSIS — D509 Iron deficiency anemia, unspecified: Secondary | ICD-10-CM | POA: Insufficient documentation

## 2022-12-02 DIAGNOSIS — Z3A19 19 weeks gestation of pregnancy: Secondary | ICD-10-CM | POA: Insufficient documentation

## 2022-12-02 LAB — PANORAMA PRENATAL TEST FULL PANEL:PANORAMA TEST PLUS 5 ADDITIONAL MICRODELETIONS: FETAL FRACTION: 10.9

## 2022-12-02 NOTE — Addendum Note (Signed)
Addended by:  Bing on: 12/02/2022 09:35 PM   Modules accepted: Orders

## 2022-12-03 ENCOUNTER — Encounter: Payer: Self-pay | Admitting: Obstetrics and Gynecology

## 2022-12-03 ENCOUNTER — Telehealth: Payer: Self-pay | Admitting: Pharmacy Technician

## 2022-12-03 NOTE — Telephone Encounter (Addendum)
Dr. Vergie Living,  Patient will be scheduled as soon as possible Auth Submission: NO AUTH NEEDED Site of care: Site of care: [facility] Payer: [facility] Medication & CPT/J Code(s) submitted: VENOFER Route of submission (phone, fax, portal):  Phone # Fax # Auth type: Buy/Bill Units/visits requested:  Reference number: [id number] Approval from: 12/03/22 to 04/04/23

## 2022-12-07 LAB — HORIZON CUSTOM: REPORT SUMMARY: POSITIVE — AB

## 2022-12-09 ENCOUNTER — Other Ambulatory Visit: Payer: Self-pay | Admitting: Family

## 2022-12-09 DIAGNOSIS — A6004 Herpesviral vulvovaginitis: Secondary | ICD-10-CM

## 2022-12-10 ENCOUNTER — Telehealth: Payer: Self-pay | Admitting: Family Medicine

## 2022-12-10 NOTE — Telephone Encounter (Signed)
Called patient at number listed in chart--[phone]--identified patient with name and DOB.   Reviewed positive Alpha Thal--silent carrier trait result with patient. Patient stated that FOB is not involved--will need to set up with genetics counselor.   Reviewed low ferritin result; patient recalls reading message from Dr. Vergie Living regarding IV iron infusion--patient is still waiting for hospital to reach out for scheduling that appointment.   Maureen Ralphs RN on 12/10/22 at [phone]

## 2022-12-10 NOTE — Telephone Encounter (Signed)
Calling for test results ?

## 2022-12-11 ENCOUNTER — Encounter: Payer: Self-pay | Admitting: Obstetrics and Gynecology

## 2022-12-11 DIAGNOSIS — D563 Thalassemia minor: Secondary | ICD-10-CM | POA: Insufficient documentation

## 2022-12-12 ENCOUNTER — Telehealth: Payer: Self-pay | Admitting: Pharmacy Technician

## 2022-12-12 ENCOUNTER — Encounter: Payer: Self-pay | Admitting: Obstetrics and Gynecology

## 2022-12-12 NOTE — Telephone Encounter (Signed)
error 

## 2022-12-17 ENCOUNTER — Other Ambulatory Visit (HOSPITAL_COMMUNITY)
Admission: RE | Admit: 2022-12-17 | Discharge: 2022-12-17 | Disposition: A | Discharge status: Home / Self Care | Payer: Medicaid Other | Source: Ambulatory Visit | Attending: Student | Admitting: Student

## 2022-12-17 ENCOUNTER — Ambulatory Visit (INDEPENDENT_AMBULATORY_CARE_PROVIDER_SITE_OTHER): Payer: Medicaid Other | Admitting: Student

## 2022-12-17 ENCOUNTER — Other Ambulatory Visit: Payer: Self-pay

## 2022-12-17 VITALS — BP 114/66 | HR 91 | Wt 191.0 lb

## 2022-12-17 DIAGNOSIS — O0992 Supervision of high risk pregnancy, unspecified, second trimester: Secondary | ICD-10-CM | POA: Diagnosis not present

## 2022-12-17 DIAGNOSIS — Z3A21 21 weeks gestation of pregnancy: Secondary | ICD-10-CM

## 2022-12-17 DIAGNOSIS — D563 Thalassemia minor: Secondary | ICD-10-CM

## 2022-12-17 DIAGNOSIS — Z8619 Personal history of other infectious and parasitic diseases: Secondary | ICD-10-CM

## 2022-12-17 DIAGNOSIS — D509 Iron deficiency anemia, unspecified: Secondary | ICD-10-CM

## 2022-12-17 DIAGNOSIS — O0932 Supervision of pregnancy with insufficient antenatal care, second trimester: Secondary | ICD-10-CM

## 2022-12-17 DIAGNOSIS — N819 Female genital prolapse, unspecified: Secondary | ICD-10-CM

## 2022-12-17 DIAGNOSIS — O99019 Anemia complicating pregnancy, unspecified trimester: Secondary | ICD-10-CM

## 2022-12-17 MED ORDER — VITAFOL ULTRA 29-0.6-0.4-200 MG PO CAPS
1.0000 | ORAL_CAPSULE | Freq: Every day | ORAL | 12 refills | Status: DC
Start: 2022-12-17 — End: 2023-02-27

## 2022-12-17 NOTE — Progress Notes (Unsigned)
   PRENATAL VISIT NOTE  Subjective:  Alexandra Lowery is a 27 y.o. G3P2002 at [redacted]w[redacted]d being seen today for ongoing prenatal care.  She is currently monitored for the following issues for this high-risk pregnancy and has Pregnancy, supervision, high-risk; Late prenatal care affecting pregnancy in second trimester; History of herpes genitalis; Female genital prolapse; Low ferritin; [redacted] weeks gestation of pregnancy; Iron deficiency anemia of mother during pregnancy; and Alpha thalassemia silent carrier on their problem list.  Patient reports  lower pelvic pain x 7 days . No change in vaginal discharge , but increased urinary frequency.  Contractions: Not present. Vag. Bleeding: None.  Movement: Present. Denies leaking of fluid.   The following portions of the patient's history were reviewed and updated as appropriate: allergies, current medications, past family history, past medical history, past social history, past surgical history and problem list.   Objective:   Vitals:   12/17/22 1434  BP: 114/66  Pulse: 91  Weight: 191 lb (86.6 kg)    Fetal Status: Fetal Heart Rate (bpm): 155   Movement: Present     General:  Alert, oriented and cooperative. Patient is in no acute distress.  Skin: Skin is warm and dry. No rash noted.   Cardiovascular: Normal heart rate noted  Respiratory: Normal respiratory effort, no problems with respiration noted  Abdomen: Soft, gravid, appropriate for gestational age.  Pain/Pressure: Absent     Pelvic: Cervical exam deferred        Extremities: Normal range of motion.  Edema: None  Mental Status: Normal mood and affect. Normal behavior. Normal judgment and thought content.   Assessment and Plan:  Pregnancy: G3P2002 at [redacted]w[redacted]d 1. Supervision of high risk pregnancy in second trimester - Doing well, frequent fetal movement   2. Late prenatal care affecting pregnancy in second trimester - initial visit at 18 weeks   3. History of herpes genitalis - Taking  Valtrex   4. Female genital prolapse, unspecified type - Working with Pelvic Floor PT - Continued pain and discomfort, not worsening, but different presentation today. Collected Urine Cx and vaginal swab for reassurance that symptoms are benign   5. Iron deficiency anemia of mother during pregnancy - Scheduled for IV iron x2  6. [redacted] weeks gestation of pregnancy - Continue routine prenatal care   7. Alpha thalassemia silent carrier - has been offered genetics consult and order placed today  Preterm labor symptoms and general obstetric precautions including but not limited to vaginal bleeding, contractions, leaking of fluid and fetal movement were reviewed in detail with the patient. Please refer to After Visit Summary for other counseling recommendations.   Return in about 4 weeks (around 01/14/2023) for [facility], IN-PERSON.  Future Appointments  Date Time Provider Department Center  12/24/2022  9:15 AM [facility] 6 [facility] None  12/26/2022  9:30 AM [facility] 2 [facility] None  12/28/2022  9:15 AM [facility] 1 [facility] None  12/31/2022  9:30 AM [facility] NURSE [facility]  12/31/2022  9:45 AM [facility] US4 [facility]  12/31/2022  1:00 PM [facility] 3 [facility] None  01/02/2023  9:30 AM [facility] 2 [facility] None  01/17/2023  9:30 AM Theressa Millard, PT [facility]  01/24/2023  9:30 AM Theressa Millard, PT [facility]  01/31/2023  9:30 AM Theressa Millard, PT [facility]  02/07/2023  9:30 AM Theressa Millard, PT [facility]    Corlis Hove, NP

## 2022-12-18 LAB — CERVICOVAGINAL ANCILLARY ONLY
Bacterial Vaginitis (gardnerella): NEGATIVE
Candida Glabrata: NEGATIVE
Candida Vaginitis: POSITIVE — AB
Chlamydia: NEGATIVE
Comment: NEGATIVE
Comment: NEGATIVE
Comment: NEGATIVE
Comment: NEGATIVE
Comment: NEGATIVE
Comment: NORMAL
Neisseria Gonorrhea: NEGATIVE
Trichomonas: NEGATIVE

## 2022-12-19 LAB — URINE CULTURE, OB REFLEX

## 2022-12-19 LAB — CULTURE, OB URINE

## 2022-12-24 ENCOUNTER — Other Ambulatory Visit: Payer: Self-pay | Admitting: Student

## 2022-12-24 ENCOUNTER — Ambulatory Visit (INDEPENDENT_AMBULATORY_CARE_PROVIDER_SITE_OTHER): Payer: Medicaid Other

## 2022-12-24 VITALS — BP 98/63 | HR 86 | Temp 98.0°F | Resp 18 | Ht 70.0 in | Wt 194.0 lb

## 2022-12-24 DIAGNOSIS — D509 Iron deficiency anemia, unspecified: Secondary | ICD-10-CM

## 2022-12-24 DIAGNOSIS — O99012 Anemia complicating pregnancy, second trimester: Secondary | ICD-10-CM | POA: Diagnosis not present

## 2022-12-24 DIAGNOSIS — R790 Abnormal level of blood mineral: Secondary | ICD-10-CM | POA: Diagnosis not present

## 2022-12-24 DIAGNOSIS — Z3A19 19 weeks gestation of pregnancy: Secondary | ICD-10-CM | POA: Diagnosis not present

## 2022-12-24 DIAGNOSIS — B3731 Acute candidiasis of vulva and vagina: Secondary | ICD-10-CM

## 2022-12-24 MED ORDER — SODIUM CHLORIDE 0.9 % IV SOLN
200.0000 mg | Freq: Once | INTRAVENOUS | Status: AC
  Administered 2022-12-24: 200 mg via INTRAVENOUS
  Filled 2022-12-24: qty 10

## 2022-12-24 MED ORDER — ACETAMINOPHEN 325 MG PO TABS
650.0000 mg | ORAL_TABLET | Freq: Once | ORAL | Status: AC
  Administered 2022-12-24: 650 mg via ORAL
  Filled 2022-12-24: qty 2

## 2022-12-24 MED ORDER — DIPHENHYDRAMINE HCL 25 MG PO CAPS
25.0000 mg | ORAL_CAPSULE | Freq: Once | ORAL | Status: AC
  Administered 2022-12-24: 25 mg via ORAL
  Filled 2022-12-24: qty 1

## 2022-12-24 MED ORDER — MICONAZOLE NITRATE 2 % VA CREA
1.0000 | TOPICAL_CREAM | Freq: Every day | VAGINAL | 2 refills | Status: DC
Start: 2022-12-24 — End: 2023-01-29

## 2022-12-24 NOTE — Patient Instructions (Signed)

## 2022-12-24 NOTE — Progress Notes (Signed)
Diagnosis: Iron Deficiency Anemia  Provider:  Chilton Greathouse MD  Procedure: IV Infusion  IV Type: Peripheral, IV Location: L Antecubital  Venofer (Iron Sucrose), Dose: 200 mg  Infusion Start Time: 0947  Infusion Stop Time: 1003  Post Infusion IV Care: Observation period completed and Peripheral IV Discontinued  Discharge: Condition: Good, Destination: Home . AVS Provided  Performed by:  Adriana Mccallum, RN

## 2022-12-25 ENCOUNTER — Telehealth: Payer: Self-pay | Admitting: Lactation Services

## 2022-12-25 NOTE — Telephone Encounter (Signed)
Called patient to inform her of Vaginal Swab results. Patient did not answer. LM for patient to check her My chart message and to pick up her prescription from her pharmacy.

## 2022-12-26 ENCOUNTER — Ambulatory Visit (INDEPENDENT_AMBULATORY_CARE_PROVIDER_SITE_OTHER): Payer: Medicaid Other

## 2022-12-26 VITALS — BP 94/62 | HR 84 | Temp 98.2°F | Resp 18 | Ht 70.0 in | Wt 196.8 lb

## 2022-12-26 DIAGNOSIS — Z3A22 22 weeks gestation of pregnancy: Secondary | ICD-10-CM | POA: Diagnosis not present

## 2022-12-26 DIAGNOSIS — O99012 Anemia complicating pregnancy, second trimester: Secondary | ICD-10-CM

## 2022-12-26 DIAGNOSIS — D509 Iron deficiency anemia, unspecified: Secondary | ICD-10-CM

## 2022-12-26 DIAGNOSIS — R790 Abnormal level of blood mineral: Secondary | ICD-10-CM

## 2022-12-26 DIAGNOSIS — Z3A19 19 weeks gestation of pregnancy: Secondary | ICD-10-CM

## 2022-12-26 MED ORDER — DIPHENHYDRAMINE HCL 25 MG PO CAPS: 25.0000 mg | ORAL_CAPSULE | Freq: Once | ORAL | Status: DC

## 2022-12-26 MED ORDER — SODIUM CHLORIDE 0.9 % IV SOLN
200.0000 mg | Freq: Once | INTRAVENOUS | Status: AC
  Administered 2022-12-26: 200 mg via INTRAVENOUS
  Filled 2022-12-26: qty 10

## 2022-12-26 MED ORDER — ACETAMINOPHEN 325 MG PO TABS: 650.0000 mg | ORAL_TABLET | Freq: Once | ORAL | Status: DC

## 2022-12-26 NOTE — Progress Notes (Signed)
Diagnosis: Iron Deficiency Anemia  Provider:  Chilton Greathouse MD  Procedure: IV Infusion  IV Type: Peripheral, IV Location: R Forearm  Venofer (Iron Sucrose), Dose: 200 mg  Infusion Start Time: 0938  Infusion Stop Time: 0958  Post Infusion IV Care: Peripheral IV Discontinued  Discharge: Condition: Good, Destination: Home . AVS Declined  Performed by:  Garnette Czech, RN

## 2022-12-28 ENCOUNTER — Ambulatory Visit (INDEPENDENT_AMBULATORY_CARE_PROVIDER_SITE_OTHER): Payer: Medicaid Other

## 2022-12-28 VITALS — BP 96/64 | HR 86 | Temp 98.4°F | Resp 18 | Ht 70.0 in | Wt 195.1 lb

## 2022-12-28 DIAGNOSIS — D509 Iron deficiency anemia, unspecified: Secondary | ICD-10-CM

## 2022-12-28 DIAGNOSIS — O99012 Anemia complicating pregnancy, second trimester: Secondary | ICD-10-CM

## 2022-12-28 DIAGNOSIS — Z3A19 19 weeks gestation of pregnancy: Secondary | ICD-10-CM

## 2022-12-28 DIAGNOSIS — Z3A22 22 weeks gestation of pregnancy: Secondary | ICD-10-CM

## 2022-12-28 DIAGNOSIS — R790 Abnormal level of blood mineral: Secondary | ICD-10-CM

## 2022-12-28 MED ORDER — DIPHENHYDRAMINE HCL 25 MG PO CAPS: 25.0000 mg | ORAL_CAPSULE | Freq: Once | ORAL | Status: DC

## 2022-12-28 MED ORDER — ACETAMINOPHEN 325 MG PO TABS: 650.0000 mg | ORAL_TABLET | Freq: Once | ORAL | Status: DC

## 2022-12-28 MED ORDER — SODIUM CHLORIDE 0.9 % IV SOLN
200.0000 mg | Freq: Once | INTRAVENOUS | Status: AC
  Administered 2022-12-28: 200 mg via INTRAVENOUS
  Filled 2022-12-28: qty 10

## 2022-12-28 NOTE — Progress Notes (Signed)
Diagnosis: Acute Anemia  Provider:  Chilton Greathouse MD  Procedure: IV Infusion  IV Type: Peripheral, IV Location: R Antecubital  Venofer (Iron Sucrose), Dose: 200 mg  Infusion Start Time: 0939  Infusion Stop Time: 1005  Post Infusion IV Care: Peripheral IV Discontinued  Discharge: Condition: Good, Destination: Home . AVS Declined  Performed by:  Nat Math, RN

## 2022-12-31 ENCOUNTER — Ambulatory Visit: Payer: Medicaid Other

## 2022-12-31 ENCOUNTER — Ambulatory Visit: Payer: Medicaid Other | Attending: Obstetrics and Gynecology

## 2022-12-31 VITALS — BP 113/59 | HR 87

## 2022-12-31 DIAGNOSIS — O99012 Anemia complicating pregnancy, second trimester: Secondary | ICD-10-CM | POA: Diagnosis not present

## 2022-12-31 DIAGNOSIS — O0932 Supervision of pregnancy with insufficient antenatal care, second trimester: Secondary | ICD-10-CM | POA: Diagnosis not present

## 2022-12-31 DIAGNOSIS — D563 Thalassemia minor: Secondary | ICD-10-CM | POA: Diagnosis not present

## 2022-12-31 DIAGNOSIS — Z3A23 23 weeks gestation of pregnancy: Secondary | ICD-10-CM

## 2022-12-31 DIAGNOSIS — O0992 Supervision of high risk pregnancy, unspecified, second trimester: Secondary | ICD-10-CM

## 2022-12-31 DIAGNOSIS — O358XX0 Maternal care for other (suspected) fetal abnormality and damage, not applicable or unspecified: Secondary | ICD-10-CM | POA: Diagnosis not present

## 2022-12-31 MED ORDER — SODIUM CHLORIDE 0.9 % IV SOLN
200.0000 mg | Freq: Once | INTRAVENOUS | Status: DC
  Filled 2022-12-31: qty 10

## 2022-12-31 MED ORDER — ACETAMINOPHEN 325 MG PO TABS: 650.0000 mg | ORAL_TABLET | Freq: Once | ORAL | Status: DC

## 2022-12-31 MED ORDER — DIPHENHYDRAMINE HCL 25 MG PO CAPS: 25.0000 mg | ORAL_CAPSULE | Freq: Once | ORAL | Status: DC

## 2023-01-02 ENCOUNTER — Ambulatory Visit (INDEPENDENT_AMBULATORY_CARE_PROVIDER_SITE_OTHER): Payer: Medicaid Other

## 2023-01-02 VITALS — BP 91/56 | HR 84 | Temp 98.4°F | Resp 16 | Ht 70.0 in | Wt 198.6 lb

## 2023-01-02 DIAGNOSIS — D508 Other iron deficiency anemias: Secondary | ICD-10-CM

## 2023-01-02 DIAGNOSIS — O99012 Anemia complicating pregnancy, second trimester: Secondary | ICD-10-CM | POA: Diagnosis not present

## 2023-01-02 DIAGNOSIS — R790 Abnormal level of blood mineral: Secondary | ICD-10-CM

## 2023-01-02 DIAGNOSIS — Z3A19 19 weeks gestation of pregnancy: Secondary | ICD-10-CM

## 2023-01-02 DIAGNOSIS — Z3A23 23 weeks gestation of pregnancy: Secondary | ICD-10-CM | POA: Diagnosis not present

## 2023-01-02 DIAGNOSIS — D509 Iron deficiency anemia, unspecified: Secondary | ICD-10-CM

## 2023-01-02 MED ORDER — SODIUM CHLORIDE 0.9 % IV SOLN
200.0000 mg | Freq: Once | INTRAVENOUS | Status: AC
  Administered 2023-01-02: 200 mg via INTRAVENOUS
  Filled 2023-01-02: qty 10

## 2023-01-02 MED ORDER — ACETAMINOPHEN 325 MG PO TABS: 650.0000 mg | ORAL_TABLET | Freq: Once | ORAL | Status: DC

## 2023-01-02 MED ORDER — DIPHENHYDRAMINE HCL 25 MG PO CAPS: 25.0000 mg | ORAL_CAPSULE | Freq: Once | ORAL | Status: DC

## 2023-01-02 NOTE — Progress Notes (Signed)
Diagnosis: Iron Deficiency Anemia  Provider:  Chilton Greathouse MD  Procedure: IV Infusion  IV Type: Peripheral, IV Location: R Antecubital  Venofer (Iron Sucrose), Dose: 200 mg  Infusion Start Time: 0938  Infusion Stop Time: 0956  Post Infusion IV Care: Peripheral IV Discontinued  Discharge: Condition: Good, Destination: Home . AVS Declined  Performed by:  Marilynn Rail, RN

## 2023-01-14 ENCOUNTER — Encounter: Payer: Self-pay | Admitting: Obstetrics and Gynecology

## 2023-01-14 ENCOUNTER — Other Ambulatory Visit: Payer: Self-pay

## 2023-01-14 ENCOUNTER — Ambulatory Visit (INDEPENDENT_AMBULATORY_CARE_PROVIDER_SITE_OTHER): Payer: Medicaid Other | Admitting: Obstetrics and Gynecology

## 2023-01-14 ENCOUNTER — Ambulatory Visit: Payer: Medicaid Other | Attending: Maternal & Fetal Medicine | Admitting: Obstetrics and Gynecology

## 2023-01-14 VITALS — BP 108/64 | HR 88 | Wt 199.1 lb

## 2023-01-14 DIAGNOSIS — R790 Abnormal level of blood mineral: Secondary | ICD-10-CM

## 2023-01-14 DIAGNOSIS — O26892 Other specified pregnancy related conditions, second trimester: Secondary | ICD-10-CM

## 2023-01-14 DIAGNOSIS — D563 Thalassemia minor: Secondary | ICD-10-CM | POA: Diagnosis not present

## 2023-01-14 DIAGNOSIS — B3731 Acute candidiasis of vulva and vagina: Secondary | ICD-10-CM

## 2023-01-14 DIAGNOSIS — M549 Dorsalgia, unspecified: Secondary | ICD-10-CM

## 2023-01-14 DIAGNOSIS — N898 Other specified noninflammatory disorders of vagina: Secondary | ICD-10-CM

## 2023-01-14 DIAGNOSIS — Z3A25 25 weeks gestation of pregnancy: Secondary | ICD-10-CM

## 2023-01-14 NOTE — Progress Notes (Signed)
   PRENATAL VISIT NOTE  Subjective:  Alexandra Lowery is a 27 y.o. G3P2002 at [redacted]w[redacted]d being seen today for ongoing prenatal care.  She is currently monitored for the following issues for this high-risk pregnancy and has Pregnancy, supervision, high-risk; Late prenatal care affecting pregnancy in second trimester; History of herpes genitalis; Female genital prolapse; Low ferritin; [redacted] weeks gestation of pregnancy; Iron deficiency anemia of mother during pregnancy; and Alpha thalassemia silent carrier on their problem list.  Patient reports  back pain that can be intense at times (not at belly ever) and vag d/c that can be watery and had some pink/orange spotting this morning .  Contractions: Irritability. Vag. Bleeding: Other, None (spotting-pinkish orange in AM of 01/14/23).  Movement: Present. Denies leaking of fluid.   The following portions of the patient's history were reviewed and updated as appropriate: allergies, current medications, past family history, past medical history, past social history, past surgical history and problem list.   Objective:   Vitals:   01/14/23 1027  BP: 108/64  Pulse: 88  Weight: 199 lb 1.6 oz (90.3 kg)    Fetal Status: Fetal Heart Rate (bpm): 139 Fundal Height: 26 cm Movement: Present     General:  Alert, oriented and cooperative. Patient is in no acute distress.  Skin: Skin is warm and dry. No rash noted.   Cardiovascular: Normal heart rate noted  Respiratory: Normal respiratory effort, no problems with respiration noted  Abdomen: Soft, gravid, appropriate for gestational age.  Pain/Pressure: Present (lower back pain)     Pelvic: Cervical exam performed in the presence of a chaperone Dilation: Closed    copious amounts of white, clumpy d/c in vault. Cervix visually closed. No blood  Extremities: Normal range of motion.  Edema: Mild pitting, slight indentation  Mental Status: Normal mood and affect. Normal behavior. Normal judgment and thought content.    Assessment and Plan:  Pregnancy: G3P2002 at [redacted]w[redacted]d 1. Vaginal discharge during pregnancy in second trimester Pt didn't pick up Rx for yeast infection diagnosed early may. D/c her that was d/c is and may have spotting from exam today  2. Alpha thalassemia silent carrier Has GC appt later today  3. [redacted] weeks gestation of pregnancy 28wk labs next visit d/w her  4. Low ferritin S/p iv iron x 5 already  5. Pregnancy related back pain, antepartum, second trimester Start pt in a few days  6. Vulvovaginal candidiasis Pt to go by pharmacy and pick up Rx  Preterm labor symptoms and general obstetric precautions including but not limited to vaginal bleeding, contractions, leaking of fluid and fetal movement were reviewed in detail with the patient. Please refer to After Visit Summary for other counseling recommendations.   Return in about 2 weeks (around 01/28/2023) for 2-3w, in person, low risk ob, md or app, fasting 2hr GTT.  Future Appointments  Date Time Provider Department Center  01/14/2023  1:00 PM [facility] GENETIC COUNSELING RM [facility]  01/17/2023  9:30 AM Theressa Millard, PT [facility]  01/24/2023  9:30 AM Theressa Millard, PT [facility]  01/31/2023  9:30 AM Theressa Millard, PT [facility]  02/07/2023  9:30 AM Theressa Millard, PT [facility]    Leonard Bing, MD

## 2023-01-14 NOTE — Progress Notes (Signed)
Referring provider: Dr. 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  She was present at the visit alone.   Alexandra Lowery had Horizon carrier screening performed through [employer]. The results of the screen identified her as a silent carrier for alpha-thalassemia (aa/a-). The screening also included testing for cystic fibrosis, spinal muscular atrophy and Beta-hemoglobin abnormalities including sickle cell disease and beta thalassemia.  The screening was negative for these conditions, which greatly reduces but cannot eliminate the chance that she is a carrier for these conditions. See that report for details and residual risk estimates.  Alpha thalassemia: Alpha-thalassemia is different in its inheritance compared to other hemoglobinopathies as there are two copies of two alpha globin genes (HBA1 and HBA2) on each chromosome 16, or four alpha globin genes total (aa/aa). A person can be a carrier of one alpha gene mutation (aa/a-), also referred to as a "silent carrier". A person who carries two alpha globin gene mutations can either carry them in cis (both on the same chromosome, denoted as aa/--) or in trans (on different chromosomes, denoted as a-/a-). Alpha-thalassemia carriers of two mutations who have African American ancestry are more likely to have a trans arrangement (a-/a-); cis configuration is reported to be rare in individuals with African American ancestry.     There are several different forms of alpha-thalassemia. The most severe form of alpha-thalassemia, Hb Barts, is associated with an absence of alpha globin chain synthesis as a result of deletions of all four alpha globin genes (--/--).  Given that Alexandra Lowery is a silent carrier (aa/a-), her pregnancies would not be at increased risk for Hb  Barts, even if her partner is a carrier for alpha-thalassemia, as she will always pass on at least one copy of the alpha globin gene to her children. Hemoglobin H (HbH) disease is caused by three deleted or dysfunctioning alpha globin alleles (a-/--) and is characterized by microcytic hypochromic hemolytic anemia, hepatosplenomegaly, mild jaundice, growth retardation, and sometimes thalassemia-like bone changes. Given Alexandra Lowery's silent carrier status (aa/a-), the current fetus would only be at risk for HbH disease (a-/--), if her partner is a carrier for two alpha globin mutations in cis (aa/--). If this is the case, the risk for HbH disease in the pregnancy would be 1 in 4 (25%). However, if Alexandra Lowery's partner is a carrier for two alpha globin mutations, he would be more likely to carry them in trans configuration (a-/a-) than the cis configuration (aa/--), given his ethnicity. If he is a carrier of alpha-thalassemia in trans, then the pregnancy would not be at increased risk for HbH disease and would at most be a carrier of two gene changes in trans (a-/a-).  Carriers may have mild anemia, or small red blood cells (low MCV on CBC), but are expected to be healthy. Based on the carrier frequency for alpha-thalassemia in the African American population, Alexandra Lowery's partner has a 1 in 30 chance of being any type of carrier for alpha-thalassemia. We also discussed that if both parents are known to be carriers, then testing during pregnancy through amniocentesis or CVS would be made available.  Aneuploidy screening: The results of the [employer] cell free DNA testing were also reviewed.  These results showed a less than 1 in 10,000 risk for trisomies 21, 18 and 13, and monosomy  X (Turner syndrome). In addition, the risk for triploidy and sex chromosome trisomies (47,XXX and 47,XXY) was also low. Alexandra Lowery elected to have cfDNA analysis for 22q11.2 deletion syndrome, which was also low risk (1 in 12,000).  While this testing identifies 94-99% of pregnancies with trisomy 12, trisomy 92, and trisomy 27, and >70% of cases of sex chromosome aneuploidies, it is NOT diagnostic. A positive test result requires confirmation by CVS or amniocentesis, and a negative test result does not rule out a fetal chromosome abnormality. This testing does not identify all genetic conditions.  Screening for open neural tube defects was also performed at her OB through maternal serum AFP only screening and was within normal range.  Family/Pregnancy history: We also obtained a detailed family history and pregnancy history.  The patient stated that this is her third pregnancy, the first with the father of the baby, Alexandra Lowery.  She has two healthy children, a 68 year old daughter and a 19 year old son, from prior relationships.  She denied any complications or exposure to medications, tobacco, alcohol or recreational drugs. Of note, Alexandra Lowery has required iron infusions and blood transfusions for severe iron deficient anemia which we would expect to be unrelated to her alpha thalassemia silent carrier status. In the family, she reported a paternal half sister who had a baby who passed away soon after birth.  Ty does not know the cause for this loss but is not aware of any birth defects in the baby.  Without a known cause, it is difficult to determine the chance for a similar outcome for any other family members. The remainder of the family history was unremarkable for birth defects, intellectual disabilities, recurrent pregnancy loss or known genetic conditions.  Plan of care: Alexandra Lowery declined testing for her partner at this time.  She is aware she can contact us and we are happy to arrange for a saliva test kit to be sent to her home for Alexandra Lowery to be tested.  Otherwise, we encouraged her to make her pediatrician aware of this history.  We appreciate being involved in the care of this patient and can be reached at [phone]  with any questions or concerns.   Cherly Anderson, MS, CGC

## 2023-01-16 NOTE — Therapy (Deleted)
OUTPATIENT PHYSICAL THERAPY FEMALE PELVIC EVALUATION   Patient Name: Alexandra Lowery MRN: [id number] DOB:[birth year], 27 y.o., female Today's Date: 01/16/2023  END OF SESSION:   Past Medical History:  Diagnosis Date   Abnormal uterine bleeding 01/31/2022   Anemia    Chlamydia 01/18/2017   Gonorrhea 2015 and 2017   HPV in female    Hx of migraines    Symptomatic anemia 01/31/2022   Past Surgical History:  Procedure Laterality Date   WISDOM TOOTH EXTRACTION     Patient Active Problem List   Diagnosis Date Noted   Alpha thalassemia silent carrier 12/11/2022   Low ferritin 12/02/2022   [redacted] weeks gestation of pregnancy 12/02/2022   Iron deficiency anemia of mother during pregnancy 12/02/2022   Late prenatal care affecting pregnancy in second trimester 11/26/2022   History of herpes genitalis 11/26/2022   Female genital prolapse 11/26/2022   Pregnancy, supervision, high-risk 11/21/2022    PCP: Ngetich, Donalee Citrin, NP  REFERRING PROVIDER: [place] Bing, MD   REFERRING DIAG:  O09.32 (ICD-10-CM) - Late prenatal care affecting pregnancy in second trimester  Z3A.18 (ICD-10-CM) - [redacted] weeks gestation of pregnancy    THERAPY DIAG:  No diagnosis found.  Rationale for Evaluation and Treatment: Rehabilitation  ONSET DATE: ***  SUBJECTIVE:                                                                                                                                                                                           SUBJECTIVE STATEMENT: high-risk pregnancy;  Female genital prolapse  Fluid intake: {Yes/No:304960894}   PAIN:  Are you having pain? {yes/no:20286} NPRS scale: ***/10 Pain location: {pelvic pain location:27098}  Pain type: {type:313116} Pain description: {PAIN DESCRIPTION:21022940}   Aggravating factors: *** Relieving factors: ***  PRECAUTIONS: Other: high risk pregancy  WEIGHT BEARING RESTRICTIONS: No  FALLS:  Has patient fallen in last 6  months? {fallsyesno:27318}  LIVING ENVIRONMENT: Lives with: {OPRC lives with:25569::"lives with their family"} Lives in: {Lives in:25570} Stairs: {opstairs:27293} Has following equipment at home: {Assistive devices:23999}  OCCUPATION: ***  PLOF: {PLOF:24004}  PATIENT GOALS: ***  PERTINENT HISTORY:  High risk pregnancy; history of Chlamydia andGonorrhea Sexual abuse: {Yes/No:304960894}  BOWEL MOVEMENT: Pain with bowel movement: {yes/no:20286} Type of bowel movement:{PT BM type:27100} Fully empty rectum: {Yes/No:304960894} Leakage: {Yes/No:304960894} Pads: {Yes/No:304960894} Fiber supplement: {Yes/No:304960894}  URINATION: Pain with urination: {yes/no:20286} Fully empty bladder: {Yes/No:304960894} Stream: {PT urination:27102} Urgency: {Yes/No:304960894} Frequency: *** Leakage: {PT leakage:27103} Pads: {Yes/No:304960894}  INTERCOURSE: Pain with intercourse: {pain with intercourse PA:27099} Ability to have vaginal penetration:  {Yes/No:304960894} Climax: *** Marinoff Scale: ***/3  PREGNANCY: Vaginal deliveries *** Tearing {Yes***/No:304960894} C-section deliveries *** Currently  pregnant {Yes***/No:304960894}  PROLAPSE: {PT prolapse:27101}   OBJECTIVE:   DIAGNOSTIC FINDINGS:  ***  PATIENT SURVEYS:  {rehab surveys:24030}  PFIQ-7 ***  COGNITION: Overall cognitive status: {cognition:24006}     SENSATION: Light touch: {intact/deficits:24005} Proprioception: {intact/deficits:24005}  MUSCLE LENGTH: Hamstrings: Right *** deg; Left *** deg Thomas test: Right *** deg; Left *** deg  LUMBAR SPECIAL TESTS:  {lumbar special test:25242}  FUNCTIONAL TESTS:  {Functional tests:24029}  GAIT: Distance walked: *** Assistive device utilized: {Assistive devices:23999} Level of assistance: {Levels of assistance:24026} Comments: ***  POSTURE: {posture:25561}  PELVIC ALIGNMENT:  LUMBARAROM/PROM:  A/PROM A/PROM  eval  Flexion   Extension   Right lateral  flexion   Left lateral flexion   Right rotation   Left rotation    (Blank rows = not tested)  LOWER EXTREMITY ROM:  {AROM/PROM:27142} ROM Right eval Left eval  Hip flexion    Hip extension    Hip abduction    Hip adduction    Hip internal rotation    Hip external rotation    Knee flexion    Knee extension    Ankle dorsiflexion    Ankle plantarflexion    Ankle inversion    Ankle eversion     (Blank rows = not tested)  LOWER EXTREMITY MMT:  MMT Right eval Left eval  Hip flexion    Hip extension    Hip abduction    Hip adduction    Hip internal rotation    Hip external rotation    Knee flexion    Knee extension    Ankle dorsiflexion    Ankle plantarflexion    Ankle inversion    Ankle eversion     PALPATION:   General  ***                External Perineal Exam ***                             Internal Pelvic Floor ***  Patient confirms identification and approves PT to assess internal pelvic floor and treatment {yes/no:20286}  PELVIC MMT:   MMT eval  Vaginal   Internal Anal Sphincter   External Anal Sphincter   Puborectalis   Diastasis Recti   (Blank rows = not tested)        TONE: ***  PROLAPSE: ***  TODAY'S TREATMENT:                                                                                                                              DATE: ***  EVAL ***   PATIENT EDUCATION:  Education details: *** Person educated: {Person educated:25204} Education method: {Education Method:25205} Education comprehension: {Education Comprehension:25206}  HOME EXERCISE PROGRAM: ***  ASSESSMENT:  CLINICAL IMPRESSION: Patient is a *** y.o. *** who was seen today for physical therapy evaluation and treatment for ***.   OBJECTIVE IMPAIRMENTS: {opptimpairments:25111}.   ACTIVITY LIMITATIONS: {activitylimitations:27494}  PARTICIPATION LIMITATIONS: {  participationrestrictions:25113}  PERSONAL FACTORS: {Personal factors:25162} are also affecting  patient's functional outcome.   REHAB POTENTIAL: {rehabpotential:25112}  CLINICAL DECISION MAKING: {clinical decision making:25114}  EVALUATION COMPLEXITY: {Evaluation complexity:25115}   GOALS: Goals reviewed with patient? {yes/no:20286}  SHORT TERM GOALS: Target date: ***  *** Baseline: Goal status: {GOALSTATUS:25110}  2.  *** Baseline:  Goal status: {GOALSTATUS:25110}  3.  *** Baseline:  Goal status: {GOALSTATUS:25110}  4.  *** Baseline:  Goal status: {GOALSTATUS:25110}  5.  *** Baseline:  Goal status: {GOALSTATUS:25110}  6.  *** Baseline:  Goal status: {GOALSTATUS:25110}  LONG TERM GOALS: Target date: ***  *** Baseline:  Goal status: {GOALSTATUS:25110}  2.  *** Baseline:  Goal status: {GOALSTATUS:25110}  3.  *** Baseline:  Goal status: {GOALSTATUS:25110}  4.  *** Baseline:  Goal status: {GOALSTATUS:25110}  5.  *** Baseline:  Goal status: {GOALSTATUS:25110}  6.  *** Baseline:  Goal status: {GOALSTATUS:25110}  PLAN:  PT FREQUENCY: {rehab frequency:25116}  PT DURATION: {rehab duration:25117}  PLANNED INTERVENTIONS: {rehab planned interventions:25118::"Therapeutic exercises","Therapeutic activity","Neuromuscular re-education","Balance training","Gait training","Patient/Family education","Self Care","Joint mobilization"}  PLAN FOR NEXT SESSION: ***   Kyran Whittier, PT 01/16/2023, 4:29 PM

## 2023-01-17 ENCOUNTER — Encounter: Payer: Medicaid Other | Attending: Obstetrics and Gynecology | Admitting: Physical Therapy

## 2023-01-19 ENCOUNTER — Encounter: Payer: Self-pay | Admitting: Obstetrics and Gynecology

## 2023-01-21 ENCOUNTER — Other Ambulatory Visit: Payer: Self-pay

## 2023-01-21 DIAGNOSIS — A6004 Herpesviral vulvovaginitis: Secondary | ICD-10-CM

## 2023-01-21 MED ORDER — VALACYCLOVIR HCL 1 G PO TABS
ORAL_TABLET | ORAL | 0 refills | Status: DC
Start: 2023-01-21 — End: 2023-01-29

## 2023-01-21 NOTE — Telephone Encounter (Signed)
From: Andrena Mews To: Office of Caesar Bookman, NP Sent: 01/20/2023 3:56 AM EDT Subject: Medication Renewal Request  Refills have been requested for the following medications:   valACYclovir (VALTREX) 1000 MG tablet [Dinah C Ngetich]  Preferred pharmacy: [id number] - , [place] - [address] AT [address] [facility] [address] Delivery method: [employer]

## 2023-01-24 ENCOUNTER — Encounter: Payer: Medicaid Other | Admitting: Physical Therapy

## 2023-01-25 ENCOUNTER — Encounter: Payer: Self-pay | Admitting: Obstetrics and Gynecology

## 2023-01-28 ENCOUNTER — Other Ambulatory Visit: Payer: Self-pay | Admitting: Family Medicine

## 2023-01-28 DIAGNOSIS — O0993 Supervision of high risk pregnancy, unspecified, third trimester: Secondary | ICD-10-CM

## 2023-01-28 NOTE — Progress Notes (Unsigned)
   PRENATAL VISIT NOTE  Subjective:  Alexandra Lowery is a 27 y.o. G3P2002 at [redacted]w[redacted]d being seen today for ongoing prenatal care.  She is currently monitored for the following issues for this {Blank single:19197::"high-risk","low-risk"} pregnancy and has Pregnancy, supervision, high-risk; Late prenatal care affecting pregnancy in second trimester; History of herpes genitalis; Female genital prolapse; Low ferritin; Iron deficiency anemia of mother during pregnancy; and Alpha thalassemia silent carrier on their problem list.  Patient reports {sx:14538}.   .  .   . Denies leaking of fluid.   The following portions of the patient's history were reviewed and updated as appropriate: allergies, current medications, past family history, past medical history, past social history, past surgical history and problem list.   Objective:  There were no vitals filed for this visit.  Fetal Status:           General:  Alert, oriented and cooperative. Patient is in no acute distress.  Skin: Skin is warm and dry. No rash noted.   Cardiovascular: Normal heart rate noted  Respiratory: Normal respiratory effort, no problems with respiration noted  Abdomen: Soft, gravid, appropriate for gestational age.        Pelvic: {Blank single:19197::"Cervical exam performed in the presence of a chaperone","Cervical exam deferred"}        Extremities: Normal range of motion.     Mental Status: Normal mood and affect. Normal behavior. Normal judgment and thought content.   Assessment and Plan:  Pregnancy: G3P2002 at [redacted]w[redacted]d 1. Late prenatal care affecting pregnancy in second trimester ***  2. Supervision of high risk pregnancy in third trimester ***  {Blank single:19197::"Term","Preterm"} labor symptoms and general obstetric precautions including but not limited to vaginal bleeding, contractions, leaking of fluid and fetal movement were reviewed in detail with the patient. Please refer to After Visit Summary for other  counseling recommendations.   No follow-ups on file.  Future Appointments  Date Time Provider Department Center  01/29/2023  8:15 AM Federico Flake, MD [facility]  01/29/2023  9:30 AM [facility]    Federico Flake, MD

## 2023-01-29 ENCOUNTER — Other Ambulatory Visit: Payer: Medicaid Other

## 2023-01-29 ENCOUNTER — Encounter: Payer: Self-pay | Admitting: Family Medicine

## 2023-01-29 ENCOUNTER — Other Ambulatory Visit: Payer: Self-pay

## 2023-01-29 ENCOUNTER — Ambulatory Visit (INDEPENDENT_AMBULATORY_CARE_PROVIDER_SITE_OTHER): Payer: Medicaid Other | Admitting: Family Medicine

## 2023-01-29 VITALS — BP 90/64 | HR 72 | Wt 204.0 lb

## 2023-01-29 DIAGNOSIS — O0932 Supervision of pregnancy with insufficient antenatal care, second trimester: Secondary | ICD-10-CM

## 2023-01-29 DIAGNOSIS — O09292 Supervision of pregnancy with other poor reproductive or obstetric history, second trimester: Secondary | ICD-10-CM

## 2023-01-29 DIAGNOSIS — O0993 Supervision of high risk pregnancy, unspecified, third trimester: Secondary | ICD-10-CM

## 2023-01-29 DIAGNOSIS — O0992 Supervision of high risk pregnancy, unspecified, second trimester: Secondary | ICD-10-CM

## 2023-01-29 DIAGNOSIS — O99012 Anemia complicating pregnancy, second trimester: Secondary | ICD-10-CM

## 2023-01-29 DIAGNOSIS — D509 Iron deficiency anemia, unspecified: Secondary | ICD-10-CM

## 2023-01-29 DIAGNOSIS — Z23 Encounter for immunization: Secondary | ICD-10-CM

## 2023-01-29 DIAGNOSIS — Z3A27 27 weeks gestation of pregnancy: Secondary | ICD-10-CM

## 2023-01-29 DIAGNOSIS — O09299 Supervision of pregnancy with other poor reproductive or obstetric history, unspecified trimester: Secondary | ICD-10-CM

## 2023-01-29 NOTE — Addendum Note (Signed)
Addended by: Marjo Bicker on: 01/29/2023 08:15 AM   Modules accepted: Orders

## 2023-01-30 LAB — ANEMIA PROFILE B
Basophils Absolute: 0 10*3/uL (ref 0.0–0.2)
Basos: 0 %
EOS (ABSOLUTE): 0 10*3/uL (ref 0.0–0.4)
Eos: 1 %
Ferritin: 31 ng/mL (ref 15–150)
Folate: 10 ng/mL (ref >3.0)
Hematocrit: 37.9 % (ref 34.0–46.6)
Hemoglobin: 11.9 g/dL (ref 11.1–15.9)
Immature Grans (Abs): 0 10*3/uL (ref 0.0–0.1)
Immature Granulocytes: 0 %
Iron Saturation: 13 % — ABNORMAL LOW (ref 15–55)
Iron: 55 ug/dL (ref 27–159)
Lymphocytes Absolute: 1 10*3/uL (ref 0.7–3.1)
Lymphs: 18 %
MCH: 26.5 pg — ABNORMAL LOW (ref 26.6–33.0)
MCHC: 31.4 g/dL — ABNORMAL LOW (ref 31.5–35.7)
MCV: 84 fL (ref 79–97)
Monocytes Absolute: 0.4 10*3/uL (ref 0.1–0.9)
Monocytes: 7 %
Neutrophils Absolute: 4.2 10*3/uL (ref 1.4–7.0)
Neutrophils: 74 %
Platelets: 210 10*3/uL (ref 150–450)
RBC: 4.49 x10E6/uL (ref 3.77–5.28)
RDW: 17.5 % — ABNORMAL HIGH (ref 11.7–15.4)
Retic Ct Pct: 1.4 % (ref 0.6–2.6)
Total Iron Binding Capacity: 428 ug/dL (ref 250–450)
UIBC: 373 ug/dL (ref 131–425)
Vitamin B-12: 265 pg/mL (ref 232–1245)
WBC: 5.7 10*3/uL (ref 3.4–10.8)

## 2023-01-30 LAB — RPR: RPR Ser Ql: NONREACTIVE

## 2023-01-30 LAB — GLUCOSE TOLERANCE, 2 HOURS W/ 1HR
Glucose, 1 hour: 96 mg/dL (ref 70–179)
Glucose, 2 hour: 70 mg/dL (ref 70–152)
Glucose, Fasting: 70 mg/dL (ref 70–91)

## 2023-01-30 LAB — HIV ANTIBODY (ROUTINE TESTING W REFLEX): HIV Screen 4th Generation wRfx: NONREACTIVE

## 2023-01-31 ENCOUNTER — Encounter: Payer: Medicaid Other | Admitting: Physical Therapy

## 2023-01-31 ENCOUNTER — Ambulatory Visit: Payer: Self-pay

## 2023-02-07 ENCOUNTER — Encounter: Payer: Medicaid Other | Admitting: Physical Therapy

## 2023-02-12 ENCOUNTER — Encounter: Payer: Medicaid Other | Admitting: Family Medicine

## 2023-02-27 ENCOUNTER — Other Ambulatory Visit: Payer: Self-pay

## 2023-02-27 ENCOUNTER — Ambulatory Visit (INDEPENDENT_AMBULATORY_CARE_PROVIDER_SITE_OTHER): Payer: Medicaid Other | Admitting: Obstetrics and Gynecology

## 2023-02-27 ENCOUNTER — Encounter: Payer: Self-pay | Admitting: Obstetrics and Gynecology

## 2023-02-27 VITALS — BP 97/63 | HR 84 | Wt 209.2 lb

## 2023-02-27 DIAGNOSIS — R102 Pelvic and perineal pain: Secondary | ICD-10-CM

## 2023-02-27 DIAGNOSIS — Z8619 Personal history of other infectious and parasitic diseases: Secondary | ICD-10-CM

## 2023-02-27 DIAGNOSIS — O0992 Supervision of high risk pregnancy, unspecified, second trimester: Secondary | ICD-10-CM

## 2023-02-27 DIAGNOSIS — Z3A31 31 weeks gestation of pregnancy: Secondary | ICD-10-CM

## 2023-02-27 DIAGNOSIS — O0993 Supervision of high risk pregnancy, unspecified, third trimester: Secondary | ICD-10-CM

## 2023-02-27 DIAGNOSIS — D509 Iron deficiency anemia, unspecified: Secondary | ICD-10-CM

## 2023-02-27 DIAGNOSIS — N819 Female genital prolapse, unspecified: Secondary | ICD-10-CM

## 2023-02-27 DIAGNOSIS — O26893 Other specified pregnancy related conditions, third trimester: Secondary | ICD-10-CM

## 2023-02-27 DIAGNOSIS — O99013 Anemia complicating pregnancy, third trimester: Secondary | ICD-10-CM

## 2023-02-27 MED ORDER — CYCLOBENZAPRINE HCL 10 MG PO TABS: 10.0000 mg | ORAL_TABLET | Freq: Three times a day (TID) | ORAL | 1 refills | Status: DC | PRN

## 2023-02-27 MED ORDER — VITAFOL ULTRA 29-0.6-0.4-200 MG PO CAPS
1.0000 | ORAL_CAPSULE | Freq: Every day | ORAL | 12 refills | Status: DC
Start: 2023-02-27 — End: 2023-05-01

## 2023-02-27 NOTE — Progress Notes (Signed)
   PRENATAL VISIT NOTE  Subjective:  Alexandra Lowery is a 27 y.o. G3P2002 at [redacted]w[redacted]d being seen today for ongoing prenatal care.  She is currently monitored for the following issues for this high-risk pregnancy and has Pregnancy, supervision, high-risk; Late prenatal care affecting pregnancy in second trimester; History of herpes genitalis; Female genital prolapse; Low ferritin; Iron deficiency anemia of mother during pregnancy; Alpha thalassemia silent carrier; and History of postpartum hemorrhage, currently pregnant on their problem list.  Patient reports . Pelvic pain and pressure  Contractions: Not present. Vag. Bleeding: None.  Movement: Present. Denies leaking of fluid.   The following portions of the patient's history were reviewed and updated as appropriate: allergies, current medications, past family history, past medical history, past social history, past surgical history and problem list.   Objective:   Vitals:   02/27/23 0948  BP: 97/63  Pulse: 84  Weight: 209 lb 3.2 oz (94.9 kg)    Fetal Status: Fetal Heart Rate (bpm): 145   Movement: Present     General:  Alert, oriented and cooperative. Patient is in no acute distress.  Skin: Skin is warm and dry. No rash noted.   Cardiovascular: Normal heart rate noted  Respiratory: Normal respiratory effort, no problems with respiration noted  Abdomen: Soft, gravid, appropriate for gestational age.  Pain/Pressure: Present     Pelvic: Cervical exam deferred        Extremities: Normal range of motion.  Edema: Trace  Mental Status: Normal mood and affect. Normal behavior. Normal judgment and thought content.   Assessment and Plan:  Pregnancy: G3P2002 at [redacted]w[redacted]d 1. Supervision of high risk pregnancy in third trimester BP and FHR normal Feeling regular fetal movement  FH appropriate  2. Iron deficiency anemia of mother during pregnancy S/p iv iron x5, recent anemia panel resolved 6/18  3. History of herpes genitalis No recent  lesion, on suppression   4. [redacted] weeks gestation of pregnancy Up to date    6. Pelvic pain affecting pregnancy in third trimester, antepartum 7. Female genital prolapse, unspecified type Hx of prolapse during previous pregnancy, has discussed this with previous providers. Pain and pressure that comes and goes. No contractions, does not feel it protruding. Pain with movement. Supportive measures discussed.  Previous appt was referred to pelvic pt and checked cvx, no prolapse noted. Did not go to pelvic pt and they cancelled appt. Discussed benefit of pelvic pt with patient, discussed tylenol, bath Strict precautions provided when to follow up and when to go to hospital Plan discussed with Dr. Debroah Loop  - AMB referral to rehabilitation   Preterm labor symptoms and general obstetric precautions including but not limited to vaginal bleeding, contractions, leaking of fluid and fetal movement were reviewed in detail with the patient. Please refer to After Visit Summary for other counseling recommendations.    Albertine Grates, FNP

## 2023-03-14 ENCOUNTER — Ambulatory Visit (INDEPENDENT_AMBULATORY_CARE_PROVIDER_SITE_OTHER): Payer: Medicaid Other | Admitting: Family Medicine

## 2023-03-14 VITALS — BP 114/65 | HR 99 | Wt 216.6 lb

## 2023-03-14 DIAGNOSIS — O99013 Anemia complicating pregnancy, third trimester: Secondary | ICD-10-CM

## 2023-03-14 DIAGNOSIS — Z8619 Personal history of other infectious and parasitic diseases: Secondary | ICD-10-CM

## 2023-03-14 DIAGNOSIS — O09299 Supervision of pregnancy with other poor reproductive or obstetric history, unspecified trimester: Secondary | ICD-10-CM

## 2023-03-14 DIAGNOSIS — O09293 Supervision of pregnancy with other poor reproductive or obstetric history, third trimester: Secondary | ICD-10-CM

## 2023-03-14 DIAGNOSIS — D509 Iron deficiency anemia, unspecified: Secondary | ICD-10-CM

## 2023-03-14 DIAGNOSIS — Z3A33 33 weeks gestation of pregnancy: Secondary | ICD-10-CM

## 2023-03-14 DIAGNOSIS — O0993 Supervision of high risk pregnancy, unspecified, third trimester: Secondary | ICD-10-CM

## 2023-03-14 NOTE — Progress Notes (Signed)
   PRENATAL VISIT NOTE  Subjective:  Alexandra Lowery is a 27 y.o. G3P2002 at [redacted]w[redacted]d being seen today for ongoing prenatal care.  She is currently monitored for the following issues for this high-risk pregnancy and has Pregnancy, supervision, high-risk; Late prenatal care affecting pregnancy in second trimester; History of herpes genitalis; Female genital prolapse; Low ferritin; Iron deficiency anemia of mother during pregnancy; Alpha thalassemia silent carrier; and History of postpartum hemorrhage, currently pregnant on their problem list.  Patient reports  increasing pelvic and vaginal pain .  Contractions: Irritability. Vag. Bleeding: None.  Movement: Present. Denies leaking of fluid.   The following portions of the patient's history were reviewed and updated as appropriate: allergies, current medications, past family history, past medical history, past social history, past surgical history and problem list.   Objective:   Vitals:   03/14/23 1459  BP: 114/65  Pulse: 99  Weight: 216 lb 9.6 oz (98.2 kg)    Fetal Status: Fetal Heart Rate (bpm): 141 Fundal Height: 32 cm Movement: Present     General:  Alert, oriented and cooperative. Patient is in no acute distress.  Skin: Skin is warm and dry. No rash noted.   Cardiovascular: Normal heart rate noted  Respiratory: Normal respiratory effort, no problems with respiration noted  Abdomen: Soft, gravid, appropriate for gestational age.  Pain/Pressure: Present     Pelvic: Cervical exam deferred        Extremities: Normal range of motion.  Edema: Trace  Mental Status: Normal mood and affect. Normal behavior. Normal judgment and thought content.   Assessment and Plan:  Pregnancy: G3P2002 at [redacted]w[redacted]d 1. Supervision of high risk pregnancy in third trimester  2. Iron deficiency anemia of mother during pregnancy S/p multiple venofer transfusion. Denies symptoms  3. History of herpes genitalis Taking oral suppression. Has multiple outbreaks.  None current. Plan for vaginal delivery unless active outbreak peripartum.  4. History of postpartum hemorrhage, currently pregnant TXA @ del. Required 3UpRBCs last delivery.  5. [redacted] weeks gestation of pregnancy Follow up in 2 weeks.     Preterm labor symptoms and general obstetric precautions including but not limited to vaginal bleeding, contractions, leaking of fluid and fetal movement were reviewed in detail with the patient. Please refer to After Visit Summary for other counseling recommendations.   Return in about 2 weeks (around 03/28/2023) for HROB follow up.  No future appointments.   Alessandro Griep Autry-Lott, DO

## 2023-03-19 ENCOUNTER — Encounter: Payer: Self-pay | Admitting: Obstetrics and Gynecology

## 2023-03-23 ENCOUNTER — Encounter (HOSPITAL_COMMUNITY): Payer: Self-pay | Admitting: Obstetrics and Gynecology

## 2023-03-23 ENCOUNTER — Encounter: Payer: Self-pay | Admitting: Obstetrics and Gynecology

## 2023-03-23 ENCOUNTER — Inpatient Hospital Stay (HOSPITAL_COMMUNITY)
Admission: AD | Admit: 2023-03-23 | Discharge: 2023-03-24 | Disposition: A | Discharge status: Home / Self Care | Payer: Medicaid Other | Attending: Obstetrics and Gynecology | Admitting: Obstetrics and Gynecology

## 2023-03-23 DIAGNOSIS — Z3689 Encounter for other specified antenatal screening: Secondary | ICD-10-CM | POA: Diagnosis not present

## 2023-03-23 DIAGNOSIS — O479 False labor, unspecified: Secondary | ICD-10-CM

## 2023-03-23 DIAGNOSIS — O4703 False labor before 37 completed weeks of gestation, third trimester: Secondary | ICD-10-CM | POA: Insufficient documentation

## 2023-03-23 DIAGNOSIS — Z3A35 35 weeks gestation of pregnancy: Secondary | ICD-10-CM | POA: Insufficient documentation

## 2023-03-23 NOTE — MAU Note (Signed)
.  Alexandra Lowery is a 27 y.o. at [redacted]w[redacted]d here in MAU reporting: contractions that began last night-have increased in intensity-reports having difficulty walking. "Can't breath with them" and muscle tightening in lower back with contraction Denies SROM, vaginal bleeding, bloody show Endorses + fetal movement  Onset of complaint: 2130 Pain score: 5 Vitals:   03/23/23 2336  BP: 119/70  Pulse: (!) 108  Resp: 17  Temp: 98.5 F (36.9 C)  SpO2: 100%     FHT:150bpm Lab orders placed from triage:

## 2023-03-24 DIAGNOSIS — Z3A35 35 weeks gestation of pregnancy: Secondary | ICD-10-CM

## 2023-03-24 DIAGNOSIS — O479 False labor, unspecified: Secondary | ICD-10-CM

## 2023-03-24 LAB — URINALYSIS, ROUTINE W REFLEX MICROSCOPIC
Bilirubin Urine: NEGATIVE
Glucose, UA: NEGATIVE mg/dL
Hgb urine dipstick: NEGATIVE
Ketones, ur: NEGATIVE mg/dL
Nitrite: NEGATIVE
Protein, ur: NEGATIVE mg/dL
Specific Gravity, Urine: 1.01 (ref 1.005–1.030)
pH: 7 (ref 5.0–8.0)

## 2023-03-24 NOTE — MAU Provider Note (Signed)
History     CSN: [id number]  Arrival date and time: 03/23/23 2302   Event Date/Time   First Provider Initiated Contact with Patient 03/24/23 0026      Chief Complaint  Patient presents with   Contractions   Alexandra Lowery is a 27 y.o. G3P2002 at [redacted]w[redacted]d who receives care at [facility].  She presents today for contractions.  She reports she has been having contractions, but they got worse around 2130.  She states she noted similar symptoms last night.  She states the pain caused her discomfort that was causing difficulty with breathing. She states she also was experiencing back pain, but that this has been ongoing as well. She rates her pain a 4/10. She denies relieving or aggravating factors for the pain. She endorses fetal movement and denies vaginal concerns.    OB History     Gravida  3   Para  2   Term  2   Preterm      AB      Living  2      SAB      IAB      Ectopic      Multiple      Live Births  2           Past Medical History:  Diagnosis Date   Abnormal uterine bleeding 01/31/2022   Anemia    Chlamydia 01/18/2017   Gonorrhea 2015 and 2017   HPV in female    Hx of migraines    Symptomatic anemia 01/31/2022    Past Surgical History:  Procedure Laterality Date   WISDOM TOOTH EXTRACTION      Family History  Problem Relation Age of Onset   Healthy Mother    Healthy Father    Asthma Sister    Asthma Brother    Diabetes Maternal Aunt    Stroke Maternal Uncle    Diabetes Maternal Uncle    Hypertension Maternal Grandmother     Social History   Tobacco Use   Smoking status: Never   Smokeless tobacco: Never  Vaping Use   Vaping status: Former  Substance Use Topics   Alcohol use: Not Currently    Comment: Social   Drug use: No    Allergies: No Known Allergies  Medications Prior to Admission  Medication Sig Dispense Refill Last Dose   Prenat-Fe Poly-Methfol-FA-DHA (VITAFOL ULTRA) 29-0.6-0.4-200 MG CAPS Take 1 capsule by mouth daily.  30 capsule 12 03/23/2023   cyclobenzaprine (FLEXERIL) 10 MG tablet Take 1 tablet (10 mg total) by mouth every 8 (eight) hours as needed for muscle spasms. (Patient not taking: Reported on 03/14/2023) 30 tablet 1     Review of Systems  Gastrointestinal:  Negative for constipation, diarrhea, nausea and vomiting.  Genitourinary:  Negative for difficulty urinating, dysuria, vaginal bleeding and vaginal discharge.  Neurological:  Negative for dizziness, light-headedness and headaches.   Physical Exam   Blood pressure 119/70, pulse (!) 108, temperature 98.5 F (36.9 C), temperature source Oral, resp. rate 17, height 5\' 10"  (1.778 m), weight 99.6 kg, last menstrual period 07/22/2022, SpO2 100%.  Physical Exam Vitals reviewed. Exam conducted with a chaperone present.  Constitutional:      Appearance: Normal appearance.  HENT:     Head: Normocephalic and atraumatic.  Eyes:     Conjunctiva/sclera: Conjunctivae normal.  Cardiovascular:     Rate and Rhythm: Normal rate.  Pulmonary:     Effort: Pulmonary effort is normal. No respiratory distress.  Abdominal:  Palpations: Abdomen is soft.     Tenderness: There is no abdominal tenderness.     Comments: Gravid--fundal height appears AGA, Soft, NT   Genitourinary:    Comments: Dilation: 1 Effacement (%): Thick Cervical Position: Posterior Exam by:: Gerrit Heck, CNM  Musculoskeletal:        General: Normal range of motion.     Cervical back: Normal range of motion.  Skin:    General: Skin is warm and dry.  Neurological:     Mental Status: She is alert and oriented to person, place, and time.  Psychiatric:        Mood and Affect: Mood normal.        Behavior: Behavior normal.     Fetal Assessment 150 bpm, Mod Var, -Decels, +Accels Toco: Mild ctx graphed  MAU Course   Results for orders placed or performed during the hospital encounter of 03/23/23 (from the past 24 hour(s))  Urinalysis, Routine w reflex microscopic -Urine, Clean  Catch     Status: Abnormal   Collection Time: 03/23/23 11:59 PM  Result Value Ref Range   Color, Urine YELLOW YELLOW   APPearance HAZY (A) CLEAR   Specific Gravity, Urine 1.010 1.005 - 1.030   pH 7.0 5.0 - 8.0   Glucose, UA NEGATIVE NEGATIVE mg/dL   Hgb urine dipstick NEGATIVE NEGATIVE   Bilirubin Urine NEGATIVE NEGATIVE   Ketones, ur NEGATIVE NEGATIVE mg/dL   Protein, ur NEGATIVE NEGATIVE mg/dL   Nitrite NEGATIVE NEGATIVE   Leukocytes,Ua TRACE (A) NEGATIVE   RBC / HPF 0-5 0 - 5 RBC/hpf   WBC, UA 0-5 0 - 5 WBC/hpf   Bacteria, UA RARE (A) NONE SEEN   Squamous Epithelial / HPF 6-10 0 - 5 /HPF   Mucus PRESENT    No results found.  MDM PE Labs: UA EFM Dental Note Assessment and Plan  27 year old G3P2002  SIUP at 35 weeks Cat I FT Contractions   -POC Reviewed. -Patient requests cervical exam and discharge, if cervix appropriate, as she states pain has improved. -Patient offered and declines STI/D testing. -Exam performed and findings discussed. -Patient reassured that okay to come for evaluation whenever she is concerned. -Discussed usage of pregnancy support band, appropriate hydration, and rest. -Instructed to keep next appt as scheduled. -Precautions reviewed.  -Patient reports dental pain and questions if she can go to dentist. Letter given. -Encouraged to call primary office or return to MAU if symptoms worsen or with the onset of new symptoms. -Discharged to home in stable condition.  Cherre Robins MSN, CNM 03/24/2023, 12:26 AM

## 2023-03-26 NOTE — Progress Notes (Unsigned)
   PRENATAL VISIT NOTE  Subjective:  Alexandra Lowery is a 27 y.o. G3P2002 at [redacted]w[redacted]d being seen today for ongoing prenatal care.  She is currently monitored for the following issues for this {Blank single:19197::"high-risk","low-risk"} pregnancy and has Pregnancy, supervision, high-risk; Late prenatal care affecting pregnancy in second trimester; History of herpes genitalis; Female genital prolapse; Low ferritin; Iron deficiency anemia of mother during pregnancy; Alpha thalassemia silent carrier; and History of postpartum hemorrhage, currently pregnant on their problem list.  Patient reports {sx:14538}.   .  .   . Denies leaking of fluid.   The following portions of the patient's history were reviewed and updated as appropriate: allergies, current medications, past family history, past medical history, past social history, past surgical history and problem list.   Objective:  There were no vitals filed for this visit.  Fetal Status:           General:  Alert, oriented and cooperative. Patient is in no acute distress.  Skin: Skin is warm and dry. No rash noted.   Cardiovascular: Normal heart rate noted  Respiratory: Normal respiratory effort, no problems with respiration noted  Abdomen: Soft, gravid, appropriate for gestational age.        Pelvic: {Blank single:19197::"Cervical exam performed in the presence of a chaperone","Cervical exam deferred"}        Extremities: Normal range of motion.     Mental Status: Normal mood and affect. Normal behavior. Normal judgment and thought content.   Assessment and Plan:  Pregnancy: G3P2002 at [redacted]w[redacted]d 1. Supervision of high risk pregnancy in third trimester ***  2. [redacted] weeks gestation of pregnancy ***  3. History of herpes genitalis ***  4. History of uterine prolapse ***  5. Iron deficiency anemia of mother during pregnancy ***  {Blank single:19197::"Term","Preterm"} labor symptoms and general obstetric precautions including but not  limited to vaginal bleeding, contractions, leaking of fluid and fetal movement were reviewed in detail with the patient. Please refer to After Visit Summary for other counseling recommendations.   No follow-ups on file.  Future Appointments  Date Time Provider Department Center  03/27/2023  8:35 AM Bernerd Limbo, CNM [facility]    Bernerd Limbo, CNM

## 2023-03-27 ENCOUNTER — Other Ambulatory Visit: Payer: Self-pay

## 2023-03-27 ENCOUNTER — Ambulatory Visit (INDEPENDENT_AMBULATORY_CARE_PROVIDER_SITE_OTHER): Payer: Medicaid Other | Admitting: Certified Nurse Midwife

## 2023-03-27 VITALS — BP 108/66 | HR 93 | Wt 218.0 lb

## 2023-03-27 DIAGNOSIS — O99013 Anemia complicating pregnancy, third trimester: Secondary | ICD-10-CM

## 2023-03-27 DIAGNOSIS — R519 Headache, unspecified: Secondary | ICD-10-CM

## 2023-03-27 DIAGNOSIS — K047 Periapical abscess without sinus: Secondary | ICD-10-CM

## 2023-03-27 DIAGNOSIS — Z8742 Personal history of other diseases of the female genital tract: Secondary | ICD-10-CM

## 2023-03-27 DIAGNOSIS — Z3A35 35 weeks gestation of pregnancy: Secondary | ICD-10-CM

## 2023-03-27 DIAGNOSIS — O26893 Other specified pregnancy related conditions, third trimester: Secondary | ICD-10-CM

## 2023-03-27 DIAGNOSIS — Z8619 Personal history of other infectious and parasitic diseases: Secondary | ICD-10-CM

## 2023-03-27 DIAGNOSIS — R102 Pelvic and perineal pain: Secondary | ICD-10-CM

## 2023-03-27 DIAGNOSIS — D509 Iron deficiency anemia, unspecified: Secondary | ICD-10-CM

## 2023-03-27 DIAGNOSIS — O0993 Supervision of high risk pregnancy, unspecified, third trimester: Secondary | ICD-10-CM

## 2023-03-27 MED ORDER — VALACYCLOVIR HCL 500 MG PO TABS: 1000.0000 mg | ORAL_TABLET | Freq: Every day | ORAL | 0 refills | Status: DC

## 2023-03-27 MED ORDER — AMOXICILLIN 500 MG PO CAPS: 500.0000 mg | ORAL_CAPSULE | Freq: Three times a day (TID) | ORAL | 0 refills | Status: DC

## 2023-03-27 MED ORDER — PREDNISONE 5 MG PO TABS
5.0000 mg | ORAL_TABLET | Freq: Every day | ORAL | 0 refills | Status: DC
Start: 2023-03-27 — End: 2023-05-01

## 2023-03-27 MED ORDER — ACETAMINOPHEN-CAFFEINE 500-65 MG PO TABS
2.0000 | ORAL_TABLET | Freq: Four times a day (QID) | ORAL | 0 refills | Status: DC | PRN
Start: 2023-03-27 — End: 2023-05-01

## 2023-03-27 NOTE — Progress Notes (Signed)
Patient stated that she was unable to walk for the past 3 days due to pelvic pain and contractions. She denies any VB or LOF.  Patient stated that she is feeling "okay" today, will have some pain when walking. Rates pain 5/10.  Koprowski informed me that she's been having a "bad headache" for the past "4 days" along with "colorful" vision disturbances

## 2023-04-05 ENCOUNTER — Encounter: Payer: Self-pay | Admitting: Obstetrics and Gynecology

## 2023-04-09 ENCOUNTER — Other Ambulatory Visit (HOSPITAL_COMMUNITY)
Admission: RE | Admit: 2023-04-09 | Discharge: 2023-04-09 | Disposition: A | Discharge status: Home / Self Care | Payer: Medicaid Other | Source: Ambulatory Visit | Attending: Obstetrics & Gynecology | Admitting: Obstetrics & Gynecology

## 2023-04-09 ENCOUNTER — Ambulatory Visit (INDEPENDENT_AMBULATORY_CARE_PROVIDER_SITE_OTHER): Payer: Medicaid Other | Admitting: Obstetrics & Gynecology

## 2023-04-09 ENCOUNTER — Telehealth: Payer: Self-pay

## 2023-04-09 ENCOUNTER — Encounter: Payer: Self-pay | Admitting: Obstetrics & Gynecology

## 2023-04-09 VITALS — BP 98/49 | HR 92 | Wt 222.6 lb

## 2023-04-09 DIAGNOSIS — Z3A37 37 weeks gestation of pregnancy: Secondary | ICD-10-CM

## 2023-04-09 DIAGNOSIS — O09299 Supervision of pregnancy with other poor reproductive or obstetric history, unspecified trimester: Secondary | ICD-10-CM

## 2023-04-09 DIAGNOSIS — O0993 Supervision of high risk pregnancy, unspecified, third trimester: Secondary | ICD-10-CM | POA: Insufficient documentation

## 2023-04-09 DIAGNOSIS — O0933 Supervision of pregnancy with insufficient antenatal care, third trimester: Secondary | ICD-10-CM

## 2023-04-09 DIAGNOSIS — O99013 Anemia complicating pregnancy, third trimester: Secondary | ICD-10-CM

## 2023-04-09 DIAGNOSIS — O09293 Supervision of pregnancy with other poor reproductive or obstetric history, third trimester: Secondary | ICD-10-CM

## 2023-04-09 DIAGNOSIS — D509 Iron deficiency anemia, unspecified: Secondary | ICD-10-CM

## 2023-04-09 DIAGNOSIS — O0932 Supervision of pregnancy with insufficient antenatal care, second trimester: Secondary | ICD-10-CM

## 2023-04-09 MED ORDER — BUTALBITAL-APAP-CAFFEINE 50-325-40 MG PO CAPS
1.0000 | ORAL_CAPSULE | Freq: Four times a day (QID) | ORAL | 0 refills | Status: DC | PRN
Start: 2023-04-09 — End: 2023-05-01

## 2023-04-09 NOTE — Progress Notes (Unsigned)
   PRENATAL VISIT NOTE  Subjective:  Alexandra Lowery is a 27 y.o. G3P2002 at [redacted]w[redacted]d being seen today for ongoing prenatal care.  She is currently monitored for the following issues for this high-risk pregnancy and has Pregnancy, supervision, high-risk; Late prenatal care affecting pregnancy in second trimester; History of herpes genitalis; Female genital prolapse; Low ferritin; Iron deficiency anemia of mother during pregnancy; Alpha thalassemia silent carrier; and History of postpartum hemorrhage, currently pregnant on their problem list.  Patient reports headache.  Contractions: Irritability. Vag. Bleeding: None.  Movement: Present. Denies leaking of fluid.   The following portions of the patient's history were reviewed and updated as appropriate: allergies, current medications, past family history, past medical history, past social history, past surgical history and problem list.   Objective:   Vitals:   04/09/23 0824  BP: (!) 98/49  Pulse: 92  Weight: 222 lb 9.6 oz (101 kg)    Fetal Status: Fetal Heart Rate (bpm): 138   Movement: Present  Presentation: Vertex  General:  Alert, oriented and cooperative. Patient is in no acute distress.  Skin: Skin is warm and dry. No rash noted.   Cardiovascular: Normal heart rate noted  Respiratory: Normal respiratory effort, no problems with respiration noted  Abdomen: Soft, gravid, appropriate for gestational age.  Pain/Pressure: Present     Pelvic: Cervical exam performed in the presence of a chaperone Dilation: 1 Effacement (%): 50 Station: -3  Extremities: Normal range of motion.  Edema: Trace  Mental Status: Normal mood and affect. Normal behavior. Normal judgment and thought content.   Assessment and Plan:  Pregnancy: G3P2002 at [redacted]w[redacted]d 1. Supervision of high risk pregnancy in third trimester H/o migraine with headache. Try Fioricet  2. Iron deficiency anemia of mother during pregnancy   3. Late prenatal care affecting pregnancy in  second trimester   4. History of postpartum hemorrhage, currently pregnant   Term labor symptoms and general obstetric precautions including but not limited to vaginal bleeding, contractions, leaking of fluid and fetal movement were reviewed in detail with the patient. Please refer to After Visit Summary for other counseling recommendations.   Return in about 1 week (around 04/16/2023).  Future Appointments  Date Time Provider Department Center  04/19/2023  8:15 AM Elderton Bing, MD [facility]  04/29/2023  8:15 AM Anyanwu, Jethro Bastos, MD [facility]    Scheryl Darter, MD

## 2023-04-09 NOTE — Telephone Encounter (Signed)
Called pt in regards to her request for IOL to be scheduled on the weekend due to child care.  I explained to the pt that she can have the discussion with provider if IOL is the plan and let them know her desires.  Pt verbalized understanding with no further questions.   Leonette Nutting  04/09/23

## 2023-04-10 LAB — GC/CHLAMYDIA PROBE AMP (~~LOC~~) NOT AT ARMC
Chlamydia: NEGATIVE
Comment: NEGATIVE
Comment: NORMAL
Neisseria Gonorrhea: NEGATIVE

## 2023-04-12 LAB — CULTURE, BETA STREP (GROUP B ONLY): Strep Gp B Culture: NEGATIVE

## 2023-04-18 ENCOUNTER — Encounter: Payer: Self-pay | Admitting: Obstetrics and Gynecology

## 2023-04-19 ENCOUNTER — Other Ambulatory Visit: Payer: Self-pay

## 2023-04-19 ENCOUNTER — Ambulatory Visit (INDEPENDENT_AMBULATORY_CARE_PROVIDER_SITE_OTHER): Payer: Medicaid Other | Admitting: Obstetrics and Gynecology

## 2023-04-19 VITALS — BP 107/69 | HR 103 | Wt 223.8 lb

## 2023-04-19 DIAGNOSIS — O0993 Supervision of high risk pregnancy, unspecified, third trimester: Secondary | ICD-10-CM

## 2023-04-19 DIAGNOSIS — Z8619 Personal history of other infectious and parasitic diseases: Secondary | ICD-10-CM

## 2023-04-19 DIAGNOSIS — Z3A38 38 weeks gestation of pregnancy: Secondary | ICD-10-CM

## 2023-04-19 DIAGNOSIS — K5900 Constipation, unspecified: Secondary | ICD-10-CM

## 2023-04-19 NOTE — Progress Notes (Signed)
   PRENATAL VISIT NOTE  Subjective:  Alexandra Lowery is a 27 y.o. G3P2002 at [redacted]w[redacted]d being seen today for ongoing prenatal care.  She is currently monitored for the following issues for this low-risk pregnancy and has Pregnancy, supervision, high-risk; Late prenatal care affecting pregnancy in second trimester; History of herpes genitalis; Female genital prolapse; Low ferritin; Iron deficiency anemia of mother during pregnancy; Alpha thalassemia silent carrier; and History of postpartum hemorrhage, currently pregnant on their problem list.  Patient reports no complaints.  Contractions: Irritability. Vag. Bleeding: None.  Movement: Present. Denies leaking of fluid.   The following portions of the patient's history were reviewed and updated as appropriate: allergies, current medications, past family history, past medical history, past social history, past surgical history and problem list.   Objective:   Vitals:   04/19/23 0820  BP: 107/69  Pulse: (!) 103  Weight: 223 lb 12.8 oz (101.5 kg)    Fetal Status: Fetal Heart Rate (bpm): 140 Fundal Height: 37 cm Movement: Present  Presentation: Vertex  General:  Alert, oriented and cooperative. Patient is in no acute distress.  Skin: Skin is warm and dry. No rash noted.   Cardiovascular: Normal heart rate noted  Respiratory: Normal respiratory effort, no problems with respiration noted  Abdomen: Soft, gravid, appropriate for gestational age.  Pain/Pressure: Present     Pelvic: Cervical exam performed in the presence of a chaperone Dilation: 1.5 Effacement (%): 50 Station: -3  Extremities: Normal range of motion.  Edema: None  Mental Status: Normal mood and affect. Normal behavior. Normal judgment and thought content.   Assessment and Plan:  Pregnancy: G3P2002 at [redacted]w[redacted]d 1. [redacted] weeks gestation of pregnancy Set up for 9/21 at 2345 post dates IOL Post dates testing next visit  2. History of herpes genitalis Continue valtrex ppx  3.  Constipation, unspecified constipation type OTCs recommended  Term labor symptoms and general obstetric precautions including but not limited to vaginal bleeding, contractions, leaking of fluid and fetal movement were reviewed in detail with the patient. Please refer to After Visit Summary for other counseling recommendations.   Return in 10 days (on 04/29/2023) for in office nst/bpp.  Future Appointments  Date Time Provider Department Center  04/29/2023  8:15 AM Anyanwu, Jethro Bastos, MD [facility]     Bing, MD

## 2023-04-21 ENCOUNTER — Other Ambulatory Visit: Payer: Self-pay | Admitting: Certified Nurse Midwife

## 2023-04-21 DIAGNOSIS — Z8619 Personal history of other infectious and parasitic diseases: Secondary | ICD-10-CM

## 2023-04-22 MED ORDER — VALACYCLOVIR HCL 500 MG PO TABS
1000.0000 mg | ORAL_TABLET | Freq: Every day | ORAL | 2 refills | Status: DC
Start: 2023-04-22 — End: 2023-04-29

## 2023-04-26 IMAGING — CR DG CHEST 2V
2 series · 2 of 2 positions shown · non-contrast
Comparison: 02/02/2020

CLINICAL DATA: Shortness of breath

EXAM:
CHEST - 2 VIEW

[chest pa]
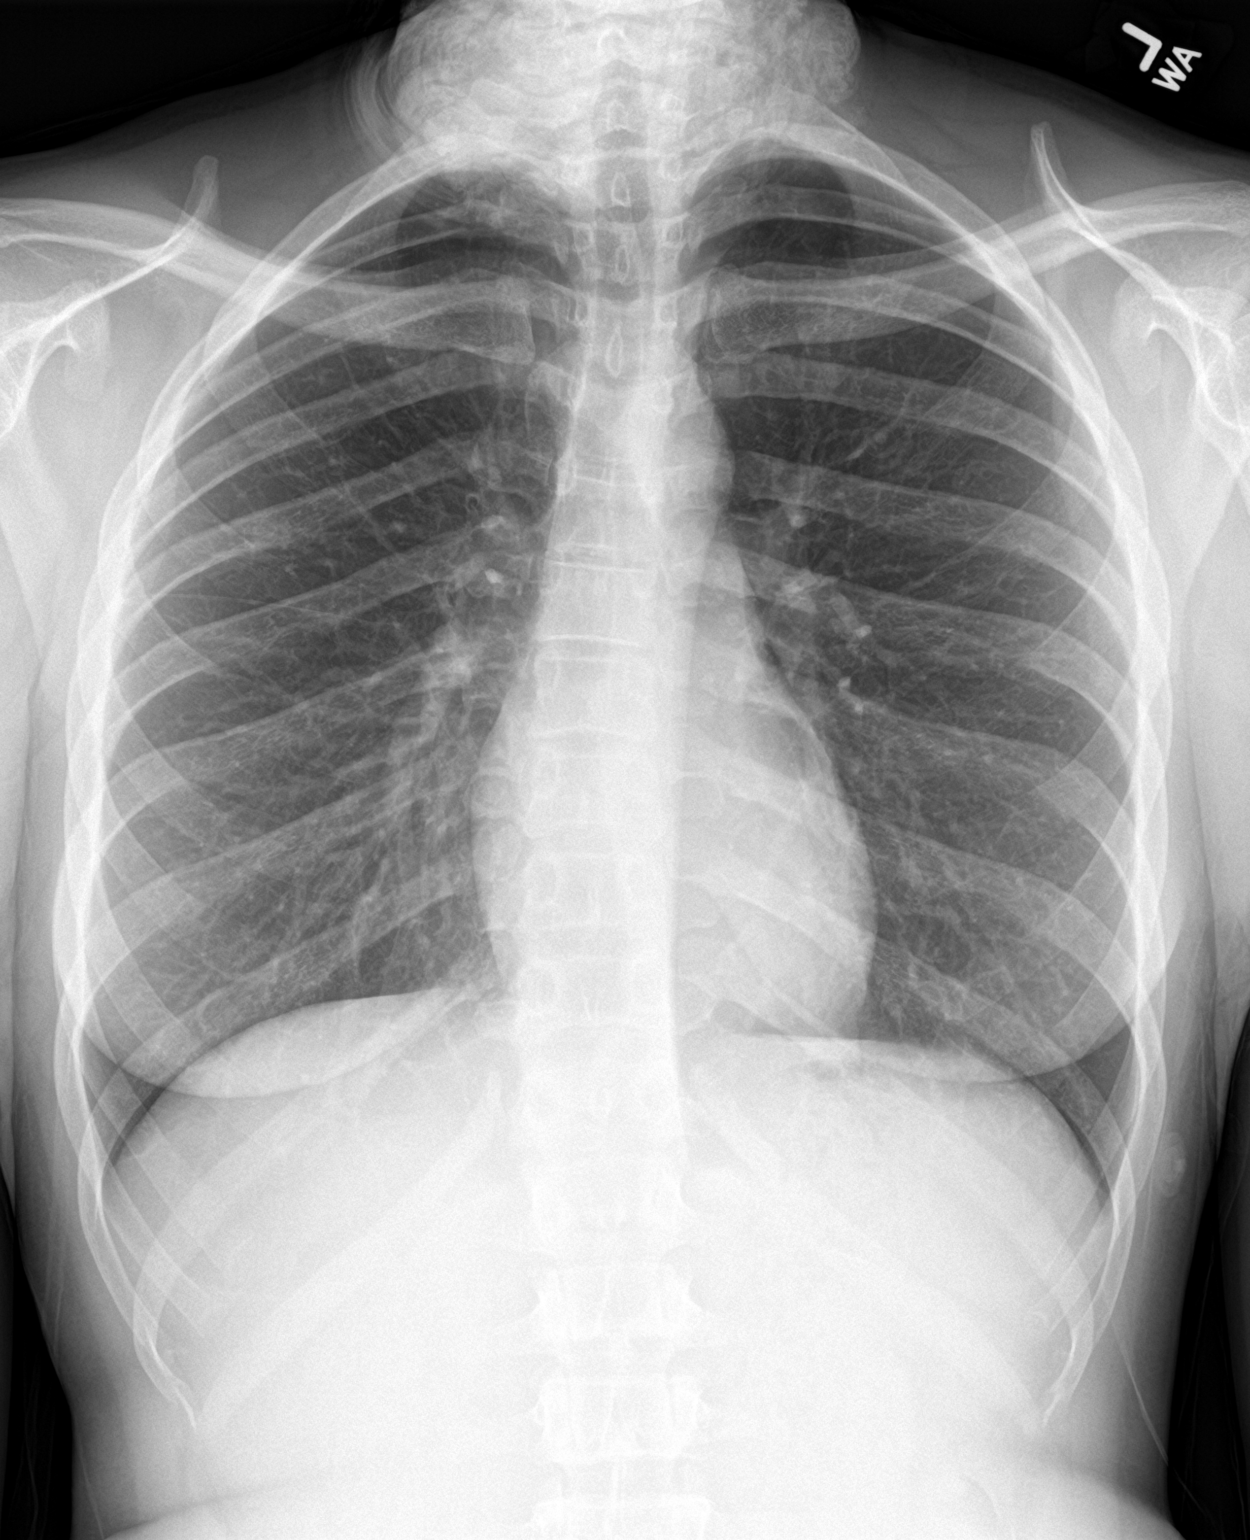

[chest lat]
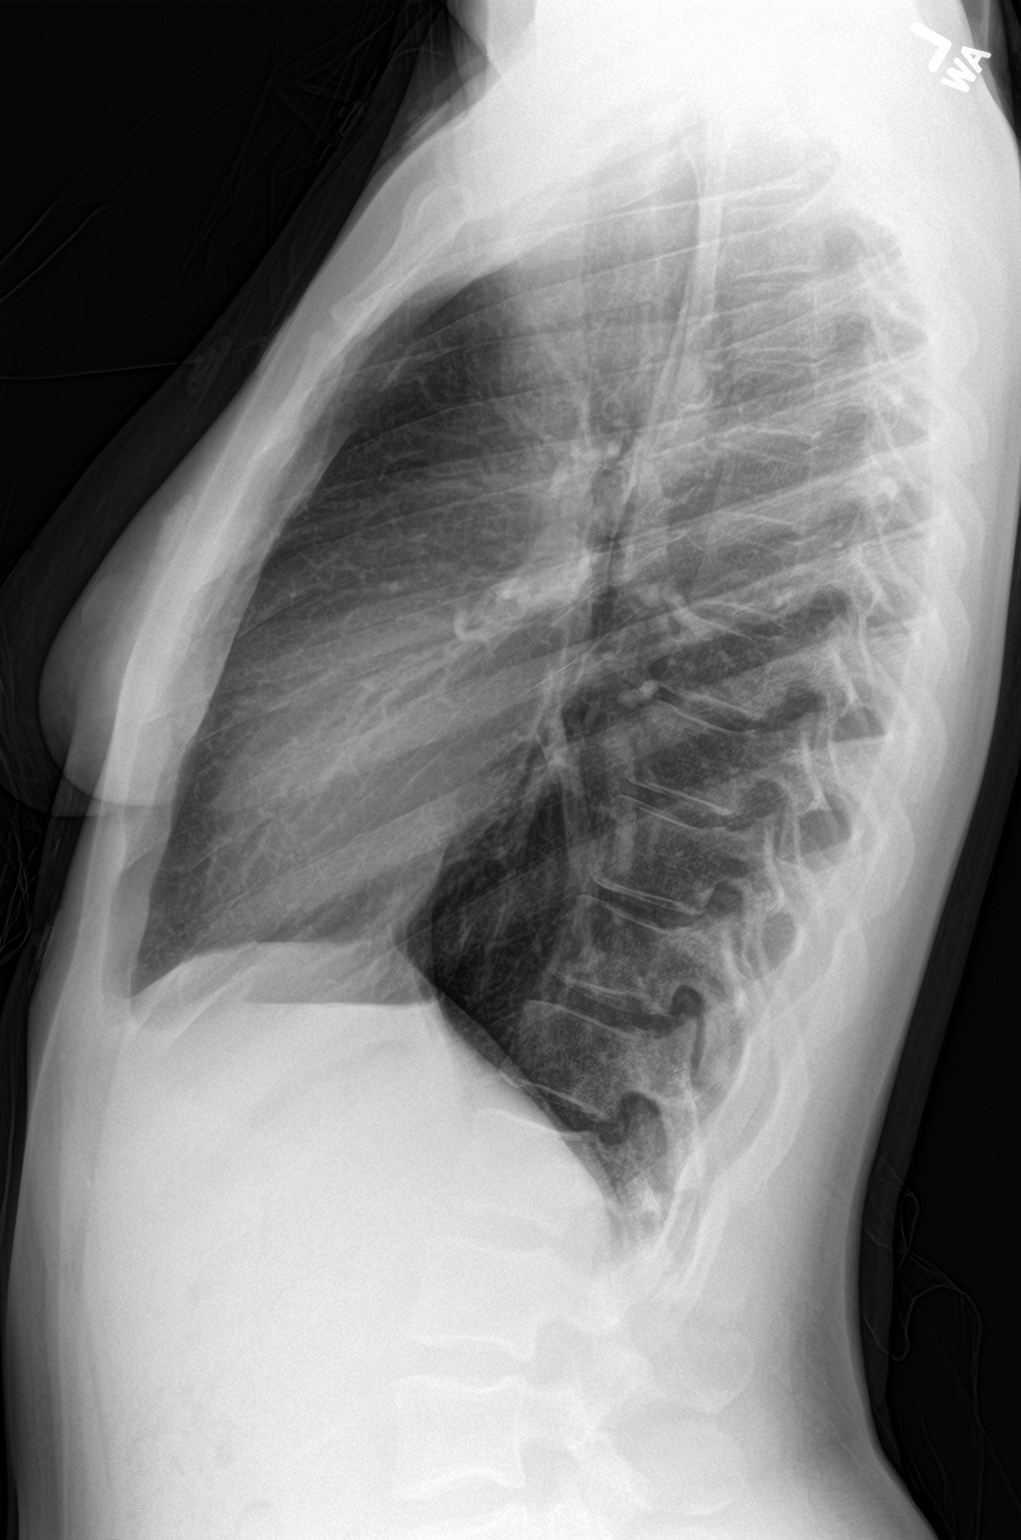

[2 of 2 positions shown; findings below may reference images not displayed]

FINDINGS: The heart size and mediastinal contours are within normal limits.
Both lungs are clear. Mild scoliosis
IMPRESSION: No active cardiopulmonary disease.

## 2023-04-29 ENCOUNTER — Ambulatory Visit (INDEPENDENT_AMBULATORY_CARE_PROVIDER_SITE_OTHER): Payer: Medicaid Other | Admitting: Obstetrics & Gynecology

## 2023-04-29 ENCOUNTER — Other Ambulatory Visit: Payer: Self-pay

## 2023-04-29 ENCOUNTER — Encounter: Payer: Self-pay | Admitting: Obstetrics & Gynecology

## 2023-04-29 ENCOUNTER — Ambulatory Visit (INDEPENDENT_AMBULATORY_CARE_PROVIDER_SITE_OTHER): Payer: Medicaid Other

## 2023-04-29 VITALS — BP 96/56 | HR 104 | Wt 228.5 lb

## 2023-04-29 DIAGNOSIS — Z3A40 40 weeks gestation of pregnancy: Secondary | ICD-10-CM

## 2023-04-29 DIAGNOSIS — Z8619 Personal history of other infectious and parasitic diseases: Secondary | ICD-10-CM

## 2023-04-29 DIAGNOSIS — O0993 Supervision of high risk pregnancy, unspecified, third trimester: Secondary | ICD-10-CM | POA: Diagnosis not present

## 2023-04-29 DIAGNOSIS — O480 Post-term pregnancy: Secondary | ICD-10-CM

## 2023-04-29 MED ORDER — VALACYCLOVIR HCL 500 MG PO TABS
1000.0000 mg | ORAL_TABLET | Freq: Every day | ORAL | 2 refills | Status: DC
Start: 2023-04-29 — End: 2023-05-01

## 2023-04-29 NOTE — Progress Notes (Signed)
   PRENATAL VISIT NOTE  Subjective:  Alexandra Lowery is a 27 y.o. G3P2002 at [redacted]w[redacted]d being seen today for ongoing prenatal care.  She is currently monitored for the following issues for this low-risk pregnancy and has Pregnancy, supervision, high-risk; Late prenatal care affecting pregnancy in second trimester; History of herpes genitalis; Female genital prolapse; Low ferritin; Iron deficiency anemia of mother during pregnancy; Alpha thalassemia silent carrier; and History of postpartum hemorrhage, currently pregnant on their problem list.  Patient reports no complaints.  Contractions: Irritability. Vag. Bleeding: None.  Movement: Present. Denies leaking of fluid.   The following portions of the patient's history were reviewed and updated as appropriate: allergies, current medications, past family history, past medical history, past social history, past surgical history and problem list.   Objective:   Vitals:   04/29/23 0819  BP: (!) 96/56  Pulse: (!) 104  Weight: 228 lb 8 oz (103.6 kg)    Fetal Status: Fetal Heart Rate (bpm): 143   Movement: Present     General:  Alert, oriented and cooperative. Patient is in no acute distress.  Skin: Skin is warm and dry. No rash noted.   Cardiovascular: Normal heart rate noted  Respiratory: Normal respiratory effort, no problems with respiration noted  Abdomen: Soft, gravid, appropriate for gestational age.  Pain/Pressure: Present     Pelvic: Cervical exam deferred        Extremities: Normal range of motion.  Edema: Trace  Mental Status: Normal mood and affect. Normal behavior. Normal judgment and thought content.   Assessment and Plan:  Pregnancy: G3P2002 at [redacted]w[redacted]d 1. History of herpes genitalis On Valtrex - valACYclovir (VALTREX) 500 MG tablet; Take 2 tablets (1,000 mg total) by mouth daily.  Dispense: 30 tablet; Refill: 2  2. Post term pregnancy over 40 weeks 3. Supervision of high risk pregnancy in third trimester Post dates BPP today.   Already scheduled for IOL on 05/05/23. - US Fetal BPP W/O Non Stress; Future Labor symptoms and general obstetric precautions including but not limited to vaginal bleeding, contractions, leaking of fluid and fetal movement were reviewed in detail with the patient. Please refer to After Visit Summary for other counseling recommendations.   No follow-ups on file.  Future Appointments  Date Time Provider Department Center  04/29/2023 11:45 AM [facility] US2 [facility]  05/05/2023 12:00 AM [facility] SCHED ROOM [facility] None    Jaynie Collins, MD

## 2023-04-29 NOTE — Patient Instructions (Signed)
Return to office for any scheduled appointments. Call the office or go to the MAU at [facility] if: You begin to have strong, frequent contractions Your water breaks.  Sometimes it is a big gush of fluid, sometimes it is just a trickle that keeps getting your underwear wet or running down your legs You have vaginal bleeding.  It is normal to have a small amount of spotting if your cervix was checked.  You do not feel your baby moving like normal.  If you do not, get something to eat and drink and lay down and focus on feeling your baby move.   If your baby is still not moving like normal, you should call the office or go to MAU. Any other obstetric concerns.

## 2023-04-30 ENCOUNTER — Encounter (HOSPITAL_COMMUNITY): Payer: Self-pay | Admitting: Obstetrics & Gynecology

## 2023-04-30 ENCOUNTER — Telehealth (HOSPITAL_COMMUNITY): Payer: Self-pay | Admitting: *Deleted

## 2023-04-30 ENCOUNTER — Inpatient Hospital Stay (HOSPITAL_COMMUNITY)
Admission: AD | Admit: 2023-04-30 | Discharge: 2023-05-03 | DRG: 806 | Disposition: A | Discharge status: Home / Self Care | Payer: Medicaid Other | Attending: Obstetrics & Gynecology | Admitting: Obstetrics & Gynecology

## 2023-04-30 ENCOUNTER — Encounter (HOSPITAL_COMMUNITY): Payer: Self-pay

## 2023-04-30 ENCOUNTER — Other Ambulatory Visit: Payer: Self-pay

## 2023-04-30 DIAGNOSIS — Z833 Family history of diabetes mellitus: Secondary | ICD-10-CM | POA: Diagnosis not present

## 2023-04-30 DIAGNOSIS — Z87891 Personal history of nicotine dependence: Secondary | ICD-10-CM

## 2023-04-30 DIAGNOSIS — O9832 Other infections with a predominantly sexual mode of transmission complicating childbirth: Secondary | ICD-10-CM | POA: Diagnosis present

## 2023-04-30 DIAGNOSIS — Z148 Genetic carrier of other disease: Secondary | ICD-10-CM

## 2023-04-30 DIAGNOSIS — A6000 Herpesviral infection of urogenital system, unspecified: Secondary | ICD-10-CM | POA: Diagnosis present

## 2023-04-30 DIAGNOSIS — O9902 Anemia complicating childbirth: Secondary | ICD-10-CM | POA: Diagnosis present

## 2023-04-30 DIAGNOSIS — Z3A40 40 weeks gestation of pregnancy: Secondary | ICD-10-CM | POA: Diagnosis not present

## 2023-04-30 DIAGNOSIS — Z8249 Family history of ischemic heart disease and other diseases of the circulatory system: Secondary | ICD-10-CM | POA: Diagnosis not present

## 2023-04-30 DIAGNOSIS — O09299 Supervision of pregnancy with other poor reproductive or obstetric history, unspecified trimester: Principal | ICD-10-CM

## 2023-04-30 DIAGNOSIS — Z823 Family history of stroke: Secondary | ICD-10-CM

## 2023-04-30 DIAGNOSIS — D509 Iron deficiency anemia, unspecified: Secondary | ICD-10-CM | POA: Diagnosis present

## 2023-04-30 DIAGNOSIS — O480 Post-term pregnancy: Secondary | ICD-10-CM | POA: Diagnosis present

## 2023-04-30 LAB — TYPE AND SCREEN
ABO/RH(D): B POS
Antibody Screen: NEGATIVE

## 2023-04-30 LAB — CBC
HCT: 39 % (ref 36.0–46.0)
Hemoglobin: 12.2 g/dL (ref 12.0–15.0)
MCH: 26.4 pg (ref 26.0–34.0)
MCHC: 31.3 g/dL (ref 30.0–36.0)
MCV: 84.4 fL (ref 80.0–100.0)
Platelets: 231 10*3/uL (ref 150–400)
RBC: 4.62 MIL/uL (ref 3.87–5.11)
RDW: 15 % (ref 11.5–15.5)
WBC: 9.4 10*3/uL (ref 4.0–10.5)
nRBC: 0 % (ref 0.0–0.2)

## 2023-04-30 MED ORDER — FENTANYL CITRATE (PF) 100 MCG/2ML IJ SOLN: 50.0000 ug | INTRAMUSCULAR | Status: DC | PRN

## 2023-04-30 MED ORDER — OXYCODONE-ACETAMINOPHEN 5-325 MG PO TABS: 2.0000 | ORAL_TABLET | ORAL | Status: DC | PRN

## 2023-04-30 MED ORDER — OXYTOCIN-SODIUM CHLORIDE 30-0.9 UT/500ML-% IV SOLN
2.5000 [IU]/h | INTRAVENOUS | Status: DC
  Administered 2023-05-01: 2.5 [IU]/h via INTRAVENOUS

## 2023-04-30 MED ORDER — SOD CITRATE-CITRIC ACID 500-334 MG/5ML PO SOLN: 30.0000 mL | ORAL | Status: DC | PRN

## 2023-04-30 MED ORDER — TERBUTALINE SULFATE 1 MG/ML IJ SOLN: 0.2500 mg | Freq: Once | INTRAMUSCULAR | Status: DC | PRN

## 2023-04-30 MED ORDER — OXYTOCIN BOLUS FROM INFUSION
333.0000 mL | Freq: Once | INTRAVENOUS | Status: AC
  Administered 2023-05-01: 333 mL via INTRAVENOUS

## 2023-04-30 MED ORDER — ZOLPIDEM TARTRATE 5 MG PO TABS: 5.0000 mg | ORAL_TABLET | Freq: Every evening | ORAL | Status: DC | PRN

## 2023-04-30 MED ORDER — LIDOCAINE HCL (PF) 1 % IJ SOLN: 30.0000 mL | INTRAMUSCULAR | Status: DC | PRN

## 2023-04-30 MED ORDER — HYDROXYZINE HCL 50 MG PO TABS: 50.0000 mg | ORAL_TABLET | Freq: Four times a day (QID) | ORAL | Status: DC | PRN

## 2023-04-30 MED ORDER — LACTATED RINGERS IV SOLN: 500.0000 mL | INTRAVENOUS | Status: DC | PRN

## 2023-04-30 MED ORDER — OXYCODONE-ACETAMINOPHEN 5-325 MG PO TABS: 1.0000 | ORAL_TABLET | ORAL | Status: DC | PRN

## 2023-04-30 MED ORDER — LACTATED RINGERS IV SOLN: INTRAVENOUS | Status: DC

## 2023-04-30 MED ORDER — ACETAMINOPHEN 325 MG PO TABS: 650.0000 mg | ORAL_TABLET | ORAL | Status: DC | PRN

## 2023-04-30 MED ORDER — OXYTOCIN-SODIUM CHLORIDE 30-0.9 UT/500ML-% IV SOLN
1.0000 m[IU]/min | INTRAVENOUS | Status: DC
  Filled 2023-04-30: qty 500

## 2023-04-30 MED ORDER — ONDANSETRON HCL 4 MG/2ML IJ SOLN: 4.0000 mg | Freq: Four times a day (QID) | INTRAMUSCULAR | Status: DC | PRN

## 2023-04-30 MED ORDER — FLEET ENEMA RE ENEM: 1.0000 | ENEMA | Freq: Every day | RECTAL | Status: DC | PRN

## 2023-04-30 NOTE — Telephone Encounter (Signed)
Preadmission screen  

## 2023-04-30 NOTE — MAU Note (Signed)
.  Alexandra Lowery is a 27 y.o. at [redacted]w[redacted]d here in MAU reporting ctxs since 1400. Reports good FM and denies LOF or VB. No recent sve  Onset of complaint: 1400 Pain score: 6 Vitals:   04/30/23 2026 04/30/23 2029  Pulse:  99  Resp: 18   Temp: 98.2 F (36.8 C)   SpO2: 99%      FHT:150 Lab orders placed from triage:  labor

## 2023-05-01 ENCOUNTER — Encounter (HOSPITAL_COMMUNITY): Payer: Self-pay | Admitting: Obstetrics & Gynecology

## 2023-05-01 ENCOUNTER — Inpatient Hospital Stay (HOSPITAL_COMMUNITY): Payer: Medicaid Other | Admitting: Anesthesiology

## 2023-05-01 DIAGNOSIS — O9832 Other infections with a predominantly sexual mode of transmission complicating childbirth: Secondary | ICD-10-CM | POA: Diagnosis not present

## 2023-05-01 DIAGNOSIS — Z3A40 40 weeks gestation of pregnancy: Secondary | ICD-10-CM | POA: Diagnosis not present

## 2023-05-01 DIAGNOSIS — O480 Post-term pregnancy: Secondary | ICD-10-CM | POA: Diagnosis not present

## 2023-05-01 MED ORDER — DIBUCAINE (PERIANAL) 1 % EX OINT: 1.0000 | TOPICAL_OINTMENT | CUTANEOUS | Status: DC | PRN

## 2023-05-01 MED ORDER — LACTATED RINGERS IV SOLN: INTRAVENOUS | Status: DC

## 2023-05-01 MED ORDER — IBUPROFEN 600 MG PO TABS
600.0000 mg | ORAL_TABLET | Freq: Four times a day (QID) | ORAL | Status: DC
  Administered 2023-05-01 – 2023-05-03 (×9): 600 mg via ORAL
  Filled 2023-05-01 (×9): qty 1

## 2023-05-01 MED ORDER — BENZOCAINE-MENTHOL 20-0.5 % EX AERO
1.0000 | INHALATION_SPRAY | CUTANEOUS | Status: DC | PRN
  Administered 2023-05-01: 1 via TOPICAL
  Filled 2023-05-01: qty 56

## 2023-05-01 MED ORDER — SODIUM CHLORIDE 0.9 % IV SOLN: 2.0000 g | Freq: Four times a day (QID) | INTRAVENOUS | Status: DC

## 2023-05-01 MED ORDER — SENNOSIDES-DOCUSATE SODIUM 8.6-50 MG PO TABS
2.0000 | ORAL_TABLET | ORAL | Status: DC
  Administered 2023-05-01 – 2023-05-02 (×2): 2 via ORAL
  Filled 2023-05-01 (×3): qty 2

## 2023-05-01 MED ORDER — ONDANSETRON HCL 4 MG/2ML IJ SOLN: 4.0000 mg | INTRAMUSCULAR | Status: DC | PRN

## 2023-05-01 MED ORDER — TRANEXAMIC ACID-NACL 1000-0.7 MG/100ML-% IV SOLN
INTRAVENOUS | Status: AC
  Administered 2023-05-01: 1000 mg via INTRAVENOUS
  Filled 2023-05-01: qty 100

## 2023-05-01 MED ORDER — DIPHENHYDRAMINE HCL 25 MG PO CAPS
25.0000 mg | ORAL_CAPSULE | Freq: Four times a day (QID) | ORAL | Status: DC | PRN
  Administered 2023-05-01: 25 mg via ORAL
  Filled 2023-05-01: qty 1

## 2023-05-01 MED ORDER — ONDANSETRON HCL 4 MG PO TABS: 4.0000 mg | ORAL_TABLET | ORAL | Status: DC | PRN

## 2023-05-01 MED ORDER — ACETAMINOPHEN 325 MG PO TABS
650.0000 mg | ORAL_TABLET | ORAL | Status: DC | PRN
  Administered 2023-05-01 (×2): 650 mg via ORAL
  Filled 2023-05-01 (×2): qty 2

## 2023-05-01 MED ORDER — DOCUSATE SODIUM 100 MG PO CAPS
100.0000 mg | ORAL_CAPSULE | Freq: Two times a day (BID) | ORAL | Status: DC
  Administered 2023-05-01 – 2023-05-03 (×2): 100 mg via ORAL
  Filled 2023-05-01 (×3): qty 1

## 2023-05-01 MED ORDER — CYCLOBENZAPRINE HCL 5 MG PO TABS: 10.0000 mg | ORAL_TABLET | Freq: Three times a day (TID) | ORAL | Status: DC | PRN

## 2023-05-01 MED ORDER — WITCH HAZEL-GLYCERIN EX PADS: 1.0000 | MEDICATED_PAD | CUTANEOUS | Status: DC | PRN

## 2023-05-01 MED ORDER — PRENATAL MULTIVITAMIN CH
1.0000 | ORAL_TABLET | Freq: Every day | ORAL | Status: DC
  Administered 2023-05-01 – 2023-05-03 (×3): 1 via ORAL
  Filled 2023-05-01 (×3): qty 1

## 2023-05-01 MED ORDER — DIPHENHYDRAMINE HCL 50 MG/ML IJ SOLN: 12.5000 mg | INTRAMUSCULAR | Status: DC | PRN

## 2023-05-01 MED ORDER — PHENYLEPHRINE 80 MCG/ML (10ML) SYRINGE FOR IV PUSH (FOR BLOOD PRESSURE SUPPORT): 80.0000 ug | PREFILLED_SYRINGE | INTRAVENOUS | Status: DC | PRN

## 2023-05-01 MED ORDER — FENTANYL-BUPIVACAINE-NACL 0.5-0.125-0.9 MG/250ML-% EP SOLN
12.0000 mL/h | EPIDURAL | Status: DC | PRN
  Administered 2023-05-01: 12 mL/h via EPIDURAL
  Filled 2023-05-01: qty 250

## 2023-05-01 MED ORDER — LACTATED RINGERS IV SOLN: 500.0000 mL | Freq: Once | INTRAVENOUS | Status: AC

## 2023-05-01 MED ORDER — COCONUT OIL OIL: 1.0000 | TOPICAL_OIL | Status: DC | PRN

## 2023-05-01 MED ORDER — PHENYLEPHRINE 80 MCG/ML (10ML) SYRINGE FOR IV PUSH (FOR BLOOD PRESSURE SUPPORT)
80.0000 ug | PREFILLED_SYRINGE | INTRAVENOUS | Status: DC | PRN
  Filled 2023-05-01: qty 10

## 2023-05-01 MED ORDER — TRANEXAMIC ACID-NACL 1000-0.7 MG/100ML-% IV SOLN: 1000.0000 mg | Freq: Once | INTRAVENOUS | Status: AC

## 2023-05-01 MED ORDER — MEASLES, MUMPS & RUBELLA VAC IJ SOLR: 0.5000 mL | Freq: Once | INTRAMUSCULAR | Status: DC

## 2023-05-01 MED ORDER — SIMETHICONE 80 MG PO CHEW: 80.0000 mg | CHEWABLE_TABLET | ORAL | Status: DC | PRN

## 2023-05-01 MED ORDER — EPHEDRINE 5 MG/ML INJ: 10.0000 mg | INTRAVENOUS | Status: DC | PRN

## 2023-05-01 MED ORDER — TETANUS-DIPHTH-ACELL PERTUSSIS 5-2.5-18.5 LF-MCG/0.5 IM SUSY: 0.5000 mL | PREFILLED_SYRINGE | Freq: Once | INTRAMUSCULAR | Status: DC

## 2023-05-01 MED ORDER — LIDOCAINE-EPINEPHRINE (PF) 1.5 %-1:200000 IJ SOLN
INTRAMUSCULAR | Status: DC | PRN
Start: 2023-05-01 — End: 2023-05-01
  Administered 2023-05-01: 3 mL via EPIDURAL

## 2023-05-01 NOTE — H&P (Addendum)
OBSTETRIC ADMISSION HISTORY AND PHYSICAL  Alexandra Lowery is a 27 y.o. female G3P2002 with IUP at [redacted]w[redacted]d by LMP presenting for SOL. She reports +FMs, No LOF, no VB, no blurry vision, headaches or peripheral edema, and RUQ pain.  She plans on breast feeding. She is unsure about contraception plans following delivery. She received her prenatal care at  [facility]    Dating: By LMP equal to [redacted]w[redacted]d [place] --->  Estimated Date of Delivery: 04/28/23  Sono:    @[redacted]w[redacted]d , CWD, normal anatomy, breech presentation, 573g, 45% EFW   Prenatal History/Complications: Late prenatal care (2nd trimester), HSV on Valtrex, alpha thal silent carrier    Past Medical History: Past Medical History:  Diagnosis Date   Abnormal uterine bleeding 01/31/2022   Anemia    Chlamydia 01/18/2017   Gonorrhea 2015 and 2017   HPV in female    Hx of migraines    Symptomatic anemia 01/31/2022    Past Surgical History: Past Surgical History:  Procedure Laterality Date   WISDOM TOOTH EXTRACTION      Obstetrical History: OB History     Gravida  3   Para  2   Term  2   Preterm      AB      Living  2      SAB      IAB      Ectopic      Multiple      Live Births  2           Social History Social History   Socioeconomic History   Marital status: Single    Spouse name: Not on file   Number of children: Not on file   Years of education: Not on file   Highest education level: Not on file  Occupational History   Not on file  Tobacco Use   Smoking status: Never   Smokeless tobacco: Never  Vaping Use   Vaping status: Former  Substance and Sexual Activity   Alcohol use: Not Currently    Comment: Social   Drug use: No   Sexual activity: Not Currently    Partners: Male    Birth control/protection: None  Other Topics Concern   Not on file  Social History Narrative   Tobacco use, amount per day now:   Past tobacco use, amount per day:   How many years did you use tobacco:   Alcohol use  (drinks per week):   Diet:   Do you drink/eat things with caffeine:   Marital status:   Single                               What year were you married?   Do you live in a house, apartment, assisted living, condo, trailer, etc.? House   Is it one or more stories?   How many persons live in your home? 7   Do you have pets in your home?( please list) No   Highest Level of education completed? 12th Grade   Current or past profession:   Do you exercise? No                                 Type and how often?   Do you have a living will? No   Do you have a DNR form?    No  If not, do you want to discuss one?   Do you have signed POA/HPOA forms?  No                      If so, please bring to you appointment      Do you have any difficulty bathing or dressing yourself? No    Do you have any difficulty preparing food or eating? No   Do you have any difficulty managing your medications? No   Do you have any difficulty managing your finances? No   Do you have any difficulty affording your medications? No   Social Determinants of Health   Financial Resource Strain: Not on file  Food Insecurity: No Food Insecurity (04/30/2023)   Hunger Vital Sign    Worried About Running Out of Food in the Last Year: Never true    Ran Out of Food in the Last Year: Never true  Transportation Needs: No Transportation Needs (04/30/2023)   PRAPARE - Administrator, Civil Service (Medical): No    Lack of Transportation (Non-Medical): No  Physical Activity: Not on file  Stress: Not on file  Social Connections: Not on file    Family History: Family History  Problem Relation Age of Onset   Healthy Mother    Healthy Father    Asthma Sister    Asthma Brother    Diabetes Maternal Aunt    Stroke Maternal Uncle    Diabetes Maternal Uncle    Hypertension Maternal Grandmother     Allergies: No Known Allergies  Medications Prior to Admission  Medication Sig Dispense  Refill Last Dose   Prenat-Fe Poly-Methfol-FA-DHA (VITAFOL ULTRA) 29-0.6-0.4-200 MG CAPS Take 1 capsule by mouth daily. 30 capsule 12 04/30/2023   valACYclovir (VALTREX) 500 MG tablet Take 2 tablets (1,000 mg total) by mouth daily. 30 tablet 2 04/30/2023   acetaminophen-caffeine (EXCEDRIN TENSION HEADACHE) 500-65 MG TABS per tablet Take 2 tablets by mouth 4 (four) times daily as needed. 60 tablet 0    amoxicillin (AMOXIL) 500 MG capsule Take 1 capsule (500 mg total) by mouth 3 (three) times daily. 15 capsule 0    Butalbital-APAP-Caffeine 50-325-40 MG capsule Take 1-2 capsules by mouth every 6 (six) hours as needed for headache. (Patient not taking: Reported on 04/19/2023) 10 capsule 0    cyclobenzaprine (FLEXERIL) 10 MG tablet Take 1 tablet (10 mg total) by mouth every 8 (eight) hours as needed for muscle spasms. (Patient not taking: Reported on 03/14/2023) 30 tablet 1    predniSONE (DELTASONE) 5 MG tablet Take 1 tablet (5 mg total) by mouth daily with breakfast. 3 tablet 0      Review of Systems   All systems reviewed and negative except as stated in HPI  Blood pressure 110/72, pulse 94, temperature 98.2 F (36.8 C), temperature source Oral, resp. rate 18, height 5\' 10"  (1.778 m), weight 104.3 kg, last menstrual period 07/22/2022, SpO2 99%. General appearance: alert, cooperative, and appears stated age Lungs: clear to auscultation bilaterally Heart: regular rate and rhythm Abdomen: soft, non-tender; bowel sounds normal Presentation: cephalic Fetal monitoringBaseline: 130 bpm, Variability: Good {> 6 bpm), and Accelerations: Reactive Uterine activity Contraction every 10 min  Dilation: 5 Effacement (%): 90 Station: -2 Exam by:: Neita Goodnight, RN  SSE without any noted HSV lesions  Prenatal labs: ABO, Rh: --/--/B POS (09/17 2247) Antibody: NEG (09/17 2247) Rubella: 4.20 (04/15 1004) RPR: Non Reactive (06/18 0839)  HBsAg: Negative (04/15 1004)  HIV: Non  Reactive (06/18 4782)  GBS:  Negative/-- (08/27 0923)  2 hr Glucola pass Genetic screening  NIPS: LR, AFP: wnl Anatomy US Normal  Prenatal Transfer Tool  Maternal Diabetes: No Genetic Screening: Normal Maternal Ultrasounds/Referrals: Normal Fetal Ultrasounds or other Referrals:  None Maternal Substance Abuse:  No Significant Maternal Medications:  None Significant Maternal Lab Results:  Group B Strep negative Number of Prenatal Visits:greater than 3 verified prenatal visits Other Comments:  None  Results for orders placed or performed during the hospital encounter of 04/30/23 (from the past 24 hour(s))  Type and screen   Collection Time: 04/30/23 10:47 PM  Result Value Ref Range   ABO/RH(D) B POS    Antibody Screen NEG    Sample Expiration      05/03/2023,2359 Performed at [facility], [address], [place], [place] [address]   CBC   Collection Time: 04/30/23 10:49 PM  Result Value Ref Range   WBC 9.4 4.0 - 10.5 K/uL   RBC 4.62 3.87 - 5.11 MIL/uL   Hemoglobin 12.2 12.0 - 15.0 g/dL   HCT 30.8 65.7 - 84.6 %   MCV 84.4 80.0 - 100.0 fL   MCH 26.4 26.0 - 34.0 pg   MCHC 31.3 30.0 - 36.0 g/dL   RDW 96.2 95.2 - 84.1 %   Platelets 231 150 - 400 K/uL   nRBC 0.0 0.0 - 0.2 %    Patient Active Problem List   Diagnosis Date Noted   Post term pregnancy 04/30/2023   History of postpartum hemorrhage, currently pregnant 01/29/2023   Alpha thalassemia silent carrier 12/11/2022   Low ferritin 12/02/2022   Iron deficiency anemia of mother during pregnancy 12/02/2022   Late prenatal care affecting pregnancy in second trimester 11/26/2022   History of herpes genitalis 11/26/2022   Female genital prolapse 11/26/2022   Pregnancy, supervision, high-risk 11/21/2022    Assessment/Plan:  Alexandra Lowery is a 27 y.o. G3P2002 at [redacted]w[redacted]d here for SOL. Was due for induction 9/22 due to PD.   #Labor: Progessing well without labor augmentation, though ctx have somewhat spaced apart, consider AROM and pitocin in  near future.  #Pain: Planning on Epidural #FWB: Cat I tracing #ID:  GBS negative #MOF: Both #MOC: unsure #Circ:  N/A #H/o HSV: on Valtrex, no lesions on SSE  Colter L Cashion, MD  05/01/2023, 1:34 AM  Attestation of Supervision of Resident:  I confirm that I have verified the information documented in the resident's note and that I have also personally performed the history, physical exam and all medical decision making activities.  I have verified that all services and findings are accurately documented in this resident's note; and I agree with management and plan as outlined in the documentation. I have also made any necessary editorial changes.  Sundra Aland, MD OB Fellow, Faculty Practice [facility], [facility]

## 2023-05-01 NOTE — Discharge Summary (Signed)
Postpartum Discharge Summary      Patient Name: Alexandra Lowery DOB: 16-Jan-1996 MRN: [id number]  Date of admission: 04/30/2023 Delivery date:05/01/2023 Delivering provider: Sundra Aland Date of discharge: 05/03/2023  Admitting diagnosis: Post term pregnancy [O48.0] Intrauterine pregnancy: [redacted]w[redacted]d     Secondary diagnosis:  Principal Problem:   SVD (spontaneous vaginal delivery) Active Problems:   Post term pregnancy  Additional problems: HSV on valtrex, anemia    Discharge diagnosis: Term Pregnancy Delivered                                              Post partum procedures: N/A Augmentation: N/A Complications: None  Hospital course: Onset of Labor With Vaginal Delivery      27 y.o. yo G3P3003 at [redacted]w[redacted]d was admitted in Latent Labor on 04/30/2023. Labor course was uncomplicated. Membrane Rupture Time/Date: 6:57 AM,05/01/2023  Delivery Method:Vaginal, Spontaneous Operative Delivery:N/A Episiotomy: None Lacerations:  2nd degree Patient had a postpartum course that was uncomplicated.  She is ambulating, tolerating a regular diet, passing flatus, and urinating well. Patient is discharged home in stable condition on 05/03/23.  Newborn Data: Birth date:05/01/2023 Birth time:7:34 AM Gender:Female Living status:Living Apgars:9 ,10  Weight:3360 g  Magnesium Sulfate received: No BMZ received: No Rhophylac:N/A MMR:N/A T-DaP:Given prenatally Flu: N/A Transfusion:No  Physical exam  Vitals:   05/02/23 0518 05/02/23 1311 05/02/23 2048 05/03/23 0627  BP: 99/60 109/81 102/65 105/62  Pulse: 78 100 80 82  Resp: 18 18 17 18   Temp: (!) 97.5 F (36.4 C) 98.4 F (36.9 C) 98 F (36.7 C) 98.4 F (36.9 C)  TempSrc: Oral Oral Oral Oral  SpO2: 100% 100% 100% 100%  Weight:      Height:       General: alert, cooperative, and no distress Lochia: appropriate Uterine Fundus: firm Incision: N/A DVT Evaluation: No evidence of DVT seen on physical exam. Labs: Lab Results  Component  Value Date   WBC 9.4 04/30/2023   HGB 12.2 04/30/2023   HCT 39.0 04/30/2023   MCV 84.4 04/30/2023   PLT 231 04/30/2023      Latest Ref Rng & Units 10/06/2022   11:48 AM  CMP  Glucose 70 - 99 mg/dL 64   BUN 6 - 20 mg/dL <5   Creatinine 5.28 - 1.00 mg/dL 4.13   Sodium 244 - 010 mmol/L 131   Potassium 3.5 - 5.1 mmol/L 3.5   Chloride 98 - 111 mmol/L 98   CO2 22 - 32 mmol/L 20   Calcium 8.9 - 10.3 mg/dL 9.4    Edinburgh Score:    05/01/2023    6:40 PM  Edinburgh Postnatal Depression Scale Screening Tool  I have been able to laugh and see the funny side of things. 0  I have looked forward with enjoyment to things. 0  I have blamed myself unnecessarily when things went wrong. 0  I have been anxious or worried for no good reason. 1  I have felt scared or panicky for no good reason. 0  Things have been getting on top of me. 2  I have been so unhappy that I have had difficulty sleeping. 0  I have felt sad or miserable. 0  I have been so unhappy that I have been crying. 0  The thought of harming myself has occurred to me. 0  Edinburgh Postnatal Depression Scale Total 3  After visit meds:  Allergies as of 05/03/2023   No Known Allergies      Medication List     STOP taking these medications    cyclobenzaprine 10 MG tablet Commonly known as: FLEXERIL       TAKE these medications    docusate sodium 100 MG capsule Commonly known as: COLACE Take 1 capsule (100 mg total) by mouth 2 (two) times daily as needed for mild constipation or moderate constipation.   ibuprofen 600 MG tablet Commonly known as: ADVIL Take 1 tablet (600 mg total) by mouth every 6 (six) hours as needed for mild pain, moderate pain or cramping.         Discharge home in stable condition Infant Feeding:  Breast and Bottle Infant Disposition:home with mother Discharge instruction: per After Visit Summary and Postpartum booklet. Activity: Advance as tolerated. Pelvic rest for 6 weeks.  Diet:  routine diet Future Appointments:No future appointments. Follow up Visit: Message sent to [facility] 9/18  Please schedule this patient for a In person postpartum visit in 6 weeks with the following provider: Any provider. Additional Postpartum F/U: N/A   High risk pregnancy complicated by:  HSV on Valtrex, Late prenatal care, Alpha thal silent carrier Delivery mode:  Vaginal, Spontaneous Anticipated Birth Control:  Unsure   05/03/2023 Jaynie Collins, MD

## 2023-05-01 NOTE — Anesthesia Preprocedure Evaluation (Signed)
Anesthesia Evaluation  Patient identified by MRN, date of birth, ID band Patient awake    Reviewed: Allergy & Precautions, Patient's Chart, lab work & pertinent test results  Airway Mallampati: II  TM Distance: >3 FB Neck ROM: Full    Dental no notable dental hx.    Pulmonary neg pulmonary ROS   Pulmonary exam normal breath sounds clear to auscultation       Cardiovascular negative cardio ROS Normal cardiovascular exam Rhythm:Regular Rate:Normal     Neuro/Psych negative neurological ROS     GI/Hepatic negative GI ROS, Neg liver ROS,,,  Endo/Other  negative endocrine ROS    Renal/GU negative Renal ROS     Musculoskeletal negative musculoskeletal ROS (+)    Abdominal  (+) + obese  Peds  Hematology  (+) Blood dyscrasia, anemia   Anesthesia Other Findings   Reproductive/Obstetrics (+) Pregnancy                             Anesthesia Physical Anesthesia Plan  ASA: 2  Anesthesia Plan: Epidural   Post-op Pain Management:    Induction:   PONV Risk Score and Plan:   Airway Management Planned:   Additional Equipment:   Intra-op Plan:   Post-operative Plan:   Informed Consent: I have reviewed the patients History and Physical, chart, labs and discussed the procedure including the risks, benefits and alternatives for the proposed anesthesia with the patient or authorized representative who has indicated his/her understanding and acceptance.       Plan Discussed with:   Anesthesia Plan Comments:        Anesthesia Quick Evaluation

## 2023-05-01 NOTE — Progress Notes (Signed)
Pt states she was feeling slightly dizzy earlier when getting out of bed and sometimes while lying in bed. Instructed pt always call for help if she feels dizzy, light headed or any symptoms, states she is better when standing. Vitals taken and stable, small amt bleeding noted. Call bell is in reach.

## 2023-05-01 NOTE — Anesthesia Procedure Notes (Signed)
Epidural Patient location during procedure: OB Start time: 05/01/2023 1:31 AM End time: 05/01/2023 1:53 AM  Staffing Anesthesiologist: Lewie Loron, MD Performed: anesthesiologist   Preanesthetic Checklist Completed: patient identified, IV checked, risks and benefits discussed, monitors and equipment checked, pre-op evaluation and timeout performed  Epidural Patient position: sitting Prep: DuraPrep and site prepped and draped Patient monitoring: heart rate, continuous pulse ox and blood pressure Approach: midline Location: L3-L4 Injection technique: LOR air and LOR saline  Needle:  Needle type: Tuohy  Needle gauge: 17 G Needle length: 9 cm Needle insertion depth: 7 cm Catheter type: closed end flexible Catheter size: 19 Gauge Catheter at skin depth: 13 cm Test dose: negative and 1.5% lidocaine with Epi 1:200 K  Assessment Sensory level: T8 Events: blood not aspirated, no cerebrospinal fluid, injection not painful, no injection resistance, no paresthesia and negative IV test  Additional Notes Reason for block:procedure for pain

## 2023-05-01 NOTE — Anesthesia Postprocedure Evaluation (Signed)
Anesthesia Post Note  Patient: Alexandra Lowery  Procedure(s) Performed: AN AD HOC LABOR EPIDURAL     Patient location during evaluation: Mother Baby Anesthesia Type: Epidural Level of consciousness: awake and alert Pain management: pain level controlled Vital Signs Assessment: post-procedure vital signs reviewed and stable Respiratory status: spontaneous breathing, nonlabored ventilation and respiratory function stable Cardiovascular status: stable Postop Assessment: no headache, no backache and epidural receding Anesthetic complications: no   No notable events documented.  Last Vitals:  Vitals:   05/01/23 0923 05/01/23 1030  BP: 91/69 98/65  Pulse: 75 78  Resp: 16 18  Temp: 36.9 C 36.8 C  SpO2: 100% 100%    Last Pain:  Vitals:   05/01/23 1237  TempSrc:   PainSc: 0-No pain   Pain Goal: Patients Stated Pain Goal: 0 (04/30/23 2032)                 Fanny Dance

## 2023-05-02 NOTE — Lactation Note (Signed)
This note was copied from a baby's chart. Lactation Consultation Note  Patient Name: Alexandra Lowery [id number] Date: 05/02/2023 Age:27 hours Reason for consult: Initial assessment;1st time breastfeeding;Exclusive pumping and bottle feeding;Term LC reviewed supply and demand , importance of the 1st 2 weeks of establishing the milk supply. LC reviewed the amount of pumping both breast for 24 hours ( 8-10 times per day ) \LC reassured her it can be a slow process and not discouraged.  LC reviewed the DEBP setup. And per mom has been using the #24 F .   Maternal Data Does the patient have breastfeeding experience prior to this delivery?: No  Feeding Mother's Current Feeding Choice: Breast Milk and Formula Nipple Type: Slow - flow    Lactation Tools Discussed/Used Tools: Pump;Flanges Flange Size: 24 Breast pump type: Double-Electric Breast Pump;Manual Reason for Pumping: to induced  lactation  Interventions DEBP m Hand pump,  Education     Discharge Pump: DEBP;Hands Free;Manual;Personal WIC Program: Yes  Consult Status Consult Status: Follow-up Date: 05/02/23 Follow-up type: In-patient    [name] 05/02/2023, 3:17 PM

## 2023-05-02 NOTE — Progress Notes (Signed)
POSTPARTUM PROGRESS NOTE  Post Partum Day #1  Subjective:  Alexandra Lowery is a 27 y.o. G3P3003 s/p TSVD at [redacted]w[redacted]d.  She reports she is doing well. No acute events overnight. She denies any problems with ambulating, voiding or po intake. Denies nausea or vomiting.  Pain is well controlled.  Lochia is appropriate.  Objective: Blood pressure 99/60, pulse 78, temperature (!) 97.5 F (36.4 C), temperature source Oral, resp. rate 18, height 5\' 10"  (1.778 m), weight 104.3 kg, last menstrual period 07/22/2022, SpO2 100%, unknown if currently breastfeeding.  Physical Exam:  General: alert, cooperative and no distress Chest: no respiratory distress Heart:regular rate, distal pulses intact Abdomen: soft, nontender,  Uterine Fundus: firm, appropriately tender DVT Evaluation: No calf swelling or tenderness Extremities: mild edema Skin: warm, dry  Recent Labs    04/30/23 2249  HGB 12.2  HCT 39.0    Assessment/Plan: Alexandra Lowery is a 27 y.o. G3P3003 s/p TSVD at [redacted]w[redacted]d   PPD#1 - Doing well  Routine postpartum care Patient wants to spend the day working on breast feeding.  Contraception: Depo inpatient and IUD postpartum.  Feeding: Breast and bottle Dispo: Plan for discharge 05/03/2023.   LOS: 2 days   Osie Cheeks, MD 05/02/2023, 7:33 AM

## 2023-05-03 MED ORDER — MEDROXYPROGESTERONE ACETATE 150 MG/ML IM SUSP
150.0000 mg | Freq: Once | INTRAMUSCULAR | Status: AC
  Administered 2023-05-03: 150 mg via INTRAMUSCULAR
  Filled 2023-05-03: qty 1

## 2023-05-03 MED ORDER — DOCUSATE SODIUM 100 MG PO CAPS: 100.0000 mg | ORAL_CAPSULE | Freq: Two times a day (BID) | ORAL | 2 refills | Status: DC | PRN

## 2023-05-03 MED ORDER — IBUPROFEN 600 MG PO TABS: 600.0000 mg | ORAL_TABLET | Freq: Four times a day (QID) | ORAL | 2 refills | Status: DC | PRN

## 2023-05-03 NOTE — Lactation Note (Signed)
This note was copied from a baby's chart. Lactation Consultation Note  Patient Name: Alexandra Lowery [id number] Date: 05/03/2023 Age:27 hours Reason for consult: Follow-up assessment;Term  P3- MOB has been pumping and offering formula. MOB initially planned on exclusive pumping, now she states now that she may switch to formula feeding only for her mental health. LC encouraged MOB to reach out to [facility] team if she decides to stop pumping to assist with milk suppression.  LC reviewed LC services handout and CDC milk storage guidelines.  Feeding Mother's Current Feeding Choice: Breast Milk and Formula  Lactation Tools Discussed/Used Tools: Pump;Flanges Breast pump type: Double-Electric Breast Pump;Manual Pump Education: Setup, frequency, and cleaning;Milk Storage  Interventions Interventions: DEBP;Education;Pace feeding;Guidelines for Milk Supply and Pumping Schedule Handout;LC Services brochure  Discharge Discharge Education: Engorgement and breast care;Warning signs for feeding baby  Consult Status Consult Status: Complete Date: 05/03/23    Dema Severin 05/03/2023, 2:24 PM

## 2023-05-03 NOTE — Progress Notes (Signed)
Post Partum Day 2 Subjective: Damyria Hoosier is a 27 year old G3P3003 s/p SVD at [redacted]w[redacted]d. She reports her bleeding is minimal, she is ambulating well, tolerating PO intake, +flatus, and +voiding. She is experiencing some back pain but is manageable with the use of heating packs. Baby is bottle feeding well and she has no complaints today.  Objective: Blood pressure 105/62, pulse 82, temperature 98.4 F (36.9 C), temperature source Oral, resp. rate 18, height 5\' 10"  (1.778 m), weight 104.3 kg, last menstrual period 07/22/2022, SpO2 100%, unknown if currently breastfeeding.  Physical Exam:  General: alert, fatigued, and no distress Lochia: appropriate Uterine Fundus: firm Incision: N/A DVT Evaluation: No evidence of DVT seen on physical exam.  Recent Labs    04/30/23 2249  HGB 12.2  HCT 39.0    Assessment/Plan: Jeanise Memon is a 27 year old G3P3003 s/p SVD at [redacted]w[redacted]d  PPD #2 Patient doing well, no complaints Plan to discharge later today 2. MOF: bottle  3. MOC: depo    LOS: 3 days   Elspeth Cho, Student-PA 05/03/2023, 8:50 AM

## 2023-05-05 ENCOUNTER — Inpatient Hospital Stay (HOSPITAL_COMMUNITY)
Admission: AD | Admit: 2023-05-05 | Payer: Medicaid Other | Source: Home / Self Care | Admitting: Obstetrics & Gynecology

## 2023-05-05 ENCOUNTER — Inpatient Hospital Stay (HOSPITAL_COMMUNITY): Payer: Medicaid Other

## 2023-05-23 ENCOUNTER — Encounter: Payer: Self-pay | Admitting: Obstetrics and Gynecology

## 2023-05-28 ENCOUNTER — Telehealth (HOSPITAL_COMMUNITY): Payer: Self-pay | Admitting: *Deleted

## 2023-05-28 NOTE — Telephone Encounter (Signed)
05/28/2023  Name: Alexandra Lowery MRN: [id number] DOB: Apr 09, 1996  Reason for Call:  Transition of Care Hospital Discharge Call  Contact Status: Patient Contact Status: Message  Language assistant needed:          Follow-Up Questions:    Inocente Salles Postnatal Depression Scale:  In the Past 7 Days:    PHQ2-9 Depression Scale:     Discharge Follow-up:    Post-discharge interventions: NA  Salena Saner, RN 05/28/2023 13:46

## 2023-06-12 ENCOUNTER — Ambulatory Visit: Payer: Medicaid Other | Admitting: Family Medicine

## 2023-06-25 DIAGNOSIS — Z0289 Encounter for other administrative examinations: Secondary | ICD-10-CM

## 2023-07-30 ENCOUNTER — Encounter: Payer: Self-pay | Admitting: Family

## 2023-07-30 ENCOUNTER — Ambulatory Visit (INDEPENDENT_AMBULATORY_CARE_PROVIDER_SITE_OTHER): Payer: No Typology Code available for payment source | Admitting: Family

## 2023-07-30 VITALS — BP 100/62 | HR 93 | Temp 98.1°F | Resp 20 | Ht 70.0 in | Wt 213.2 lb

## 2023-07-30 DIAGNOSIS — Z2821 Immunization not carried out because of patient refusal: Secondary | ICD-10-CM | POA: Diagnosis not present

## 2023-07-30 DIAGNOSIS — A6004 Herpesviral vulvovaginitis: Secondary | ICD-10-CM

## 2023-07-30 DIAGNOSIS — F320 Major depressive disorder, single episode, mild: Secondary | ICD-10-CM | POA: Diagnosis not present

## 2023-07-30 DIAGNOSIS — D509 Iron deficiency anemia, unspecified: Secondary | ICD-10-CM

## 2023-07-30 MED ORDER — VALACYCLOVIR HCL 1 G PO TABS: 1000.0000 mg | ORAL_TABLET | Freq: Every day | ORAL | 1 refills | Status: DC

## 2023-07-30 NOTE — Progress Notes (Unsigned)
Provider: Richarda Blade FNP-C   Alexandra Lowery, Alexandra Citrin, NP  Patient Care Team: Ronan Dion, Alexandra Citrin, NP as PCP - General (Family Medicine)  Extended Emergency Contact Information Primary Emergency Contact: [name],April Address: [address] [name]          [place], [place] [address] [place] Home Phone: [phone] Mobile Phone: [phone] Relation: Mother  Code Status:  Full Code  Goals of care: Advanced Directive information    07/30/2023    1:47 PM  Advanced Directives  Does Patient Have a Medical Advance Directive? No  Would patient like information on creating a medical advance directive? No - Patient declined     Chief Complaint  Patient presents with   Annual Exam    General check and discuss covid and flu vaccines.    HPI:  Pt is a 27 y.o. female seen today for annual Physical examination. 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  Request refill on Valtrex.states no break out of rash.Not sexually active but would like to continue with depo injection. Will schedule appointment with Gyn. She declines influenza vaccine.  Past Medical History:  Diagnosis Date   Abnormal uterine bleeding 01/31/2022   Anemia    Chlamydia 01/18/2017   Gonorrhea 2015 and 2017   HPV in female    Hx of migraines    Symptomatic anemia 01/31/2022   Past Surgical History:  Procedure Laterality Date   WISDOM TOOTH EXTRACTION      No Known Allergies  Allergies as of 07/30/2023   No Known Allergies      Medication List        Accurate as of July 30, 2023  2:21 PM. If you have any questions, ask your nurse or doctor.          STOP taking these medications    docusate sodium 100 MG capsule Commonly known as:  COLACE Stopped by: Alexandra Lowery C Jeb Schloemer   ibuprofen 600 MG tablet Commonly known as: ADVIL Stopped by: Alexandra Lowery Alexandra Lowery        Review of Systems  Constitutional:  Negative for appetite change, chills, fatigue, fever and unexpected weight change.  HENT:  Negative for congestion, dental problem, ear discharge, ear pain, facial swelling, hearing loss, nosebleeds, postnasal drip, rhinorrhea, sinus pressure, sinus pain, sneezing, sore throat, tinnitus and trouble swallowing.   Eyes:  Negative for pain, discharge, redness, itching and visual disturbance.  Respiratory:  Negative for cough, chest tightness, shortness of breath and wheezing.   Cardiovascular:  Negative for chest pain, palpitations and leg swelling.  Gastrointestinal:  Negative for abdominal distention, abdominal pain, blood in stool, constipation, diarrhea, nausea and vomiting.  Endocrine: Negative for cold intolerance, heat intolerance, polydipsia, polyphagia and polyuria.  Genitourinary:  Negative for difficulty urinating, dysuria, flank pain, frequency and urgency.  Musculoskeletal:  Negative for arthralgias, back pain, gait problem, joint swelling, myalgias, neck pain and neck stiffness.  Skin:  Negative for color change, pallor, rash and wound.  Neurological:  Negative for dizziness, syncope, speech difficulty, weakness, light-headedness, numbness and headaches.  Hematological:  Does not bruise/bleed easily.  Psychiatric/Behavioral:  Negative for agitation, behavioral problems, confusion, hallucinations, self-injury, sleep disturbance and suicidal ideas. The patient is not nervous/anxious.     Immunization History  Administered  Date(s) Administered   DTaP 06/22/1997, 03/26/2002, 05/28/2002   HIB (PRP-OMP) 06/22/1997   Hepatitis A 04/01/2007   Hepatitis B 07-13-96, 06/22/1997, 03/26/2002   Moderna Sars-Covid-2 Vaccination 07/12/2020, 08/10/2020   Tdap 12/27/2006, 01/29/2023   Varicella 05/28/2002, 04/01/2007   Pertinent   Health Maintenance Due  Topic Date Due   INFLUENZA VACCINE  Never done      01/25/2022    1:42 PM 01/30/2022    8:27 PM 01/31/2022    8:08 AM 08/17/2022    5:50 PM 07/30/2023    1:46 PM  Fall Risk  Falls in the past year? 0      Was there an injury with Fall? 0    0  Fall Risk Category Calculator 0      Fall Risk Category (Retired) Low      (RETIRED) Patient Fall Risk Level Low fall risk Low fall risk Low fall risk Low fall risk   Patient at Risk for Falls Due to No Fall Risks      Fall risk Follow up Falls evaluation completed       Functional Status Survey:    Vitals:   07/30/23 1350  BP: 100/62  Pulse: 93  Resp: 20  Temp: 98.1 F (36.7 C)  SpO2: 99%  Weight: 213 lb 3.2 oz (96.7 kg)  Height: 5\' 10"  (1.778 m)   Body mass index is 30.59 kg/m. Physical Exam Vitals reviewed.  Constitutional:      General: She is not in acute distress.    Appearance: Normal appearance. She is obese. She is not ill-appearing or diaphoretic.  HENT:     Head: Normocephalic.     Right Ear: Tympanic membrane, ear canal and external ear normal. There is no impacted cerumen.     Left Ear: Tympanic membrane, ear canal and external ear normal. There is no impacted cerumen.     Nose: Nose normal. No congestion or rhinorrhea.     Mouth/Throat:     Mouth: Mucous membranes are moist.     Pharynx: Oropharynx is clear. No oropharyngeal exudate or posterior oropharyngeal erythema.  Eyes:     General: No scleral icterus.       Right eye: No discharge.        Left eye: No discharge.     Extraocular Movements: Extraocular movements intact.     Conjunctiva/sclera: Conjunctivae normal.     Pupils: Pupils are equal, round, and reactive to light.  Neck:     Vascular: No carotid bruit.  Cardiovascular:     Rate and Rhythm: Normal rate and regular rhythm.     Pulses: Normal pulses.     Heart sounds: Normal heart sounds. No murmur heard.    No friction rub. No gallop.  Pulmonary:     Effort: Pulmonary  effort is normal. No respiratory distress.     Breath sounds: Normal breath sounds. No wheezing, rhonchi or rales.  Chest:     Chest wall: No tenderness.  Abdominal:     General: Bowel sounds are normal. There is no distension.     Palpations: Abdomen is soft. There is no mass.     Tenderness: There is no abdominal tenderness. There is no right CVA tenderness, left CVA tenderness, guarding or rebound.  Musculoskeletal:        General: No swelling or tenderness. Normal range of motion.     Cervical back: Normal range of motion. No rigidity or tenderness.     Right lower leg: No edema.  Left lower leg: No edema.  Lymphadenopathy:     Cervical: No cervical adenopathy.  Skin:    General: Skin is warm and dry.     Coloration: Skin is not pale.     Findings: No bruising, erythema, lesion or rash.  Neurological:     Mental Status: She is alert and oriented to person, place, and time.     Cranial Nerves: No cranial nerve deficit.     Sensory: No sensory deficit.     Motor: No weakness.     Coordination: Coordination normal.     Gait: Gait normal.  Psychiatric:        Mood and Affect: Mood normal.        Speech: Speech normal.        Behavior: Behavior normal.        Thought Content: Thought content normal.        Judgment: Judgment normal.     Labs reviewed: Recent Labs    10/06/22 1148  NA 131*  K 3.5  CL 98  CO2 20*  GLUCOSE 64*  BUN <5*  CREATININE 0.66  CALCIUM 9.4   No results for input(s): "AST", "ALT", "ALKPHOS", "BILITOT", "PROT", "ALBUMIN" in the last 8760 hours. Recent Labs    11/26/22 1004 01/29/23 0839 04/30/23 2249  WBC 7.0 5.7 9.4  NEUTROABS 5.4 4.2  --   HGB 11.1 11.9 12.2  HCT 36.4 37.9 39.0  MCV 82 84 84.4  PLT 298 210 231   Lab Results  Component Value Date   TSH 3.206 01/31/2022   Lab Results  Component Value Date   HGBA1C 4.9 11/26/2022   No results found for: "CHOL", "HDL", "LDLCALC", "LDLDIRECT", "TRIG", "CHOLHDL"  Significant  Diagnostic Results in last 30 days:  No results found.  Assessment/Plan  1. Current mild episode of major depressive disorder without prior episode (HCC) (Primary) Stopped previous prescribed sertraline. - declines any antidepressant for now will notify provider if symptom worsen. -Recommended follow-up with psychiatry service or counseling services - CBC with Differential - Comprehensive metabolic panel  2. Herpes simplex vulvovaginitis Symptomatic -Request Valtrex to have at hand so that she can use in case of any break out of blisters -Valtrex prescribed  3. Iron deficiency anemia, unspecified iron deficiency anemia type Previous Hgb improved -Will recheck hemoglobin level - CBC with Differential  4. Influenza vaccine refused Afebrile  Flu shot administered by CMA no acute reaction reported.   Family/ staff Communication: Reviewed plan of care with patient verbalized understanding  Labs/tests ordered:  - CBC with Differential - Comprehensive metabolic panel  Next Appointment : Return in 1 year (on 07/29/2024) for annual Physical examination.   Caesar Bookman, NP

## 2023-07-30 NOTE — Patient Instructions (Signed)

## 2023-07-31 LAB — CBC WITH DIFFERENTIAL/PLATELET
Absolute Lymphocytes: 1434 {cells}/uL (ref 850–3900)
Absolute Monocytes: 330 {cells}/uL (ref 200–950)
Basophils Absolute: 30 {cells}/uL (ref 0–200)
Basophils Relative: 0.5 %
Eosinophils Absolute: 18 {cells}/uL (ref 15–500)
Eosinophils Relative: 0.3 %
HCT: 42.3 % (ref 35.0–45.0)
Hemoglobin: 13.2 g/dL (ref 11.7–15.5)
MCH: 26 pg — ABNORMAL LOW (ref 27.0–33.0)
MCHC: 31.2 g/dL — ABNORMAL LOW (ref 32.0–36.0)
MCV: 83.3 fL (ref 80.0–100.0)
MPV: 11.7 fL (ref 7.5–12.5)
Monocytes Relative: 5.5 %
Neutro Abs: 4188 {cells}/uL (ref 1500–7800)
Neutrophils Relative %: 69.8 %
Platelets: 315 10*3/uL (ref 140–400)
RBC: 5.08 10*6/uL (ref 3.80–5.10)
RDW: 13 % (ref 11.0–15.0)
Total Lymphocyte: 23.9 %
WBC: 6 10*3/uL (ref 3.8–10.8)

## 2023-07-31 LAB — COMPREHENSIVE METABOLIC PANEL
AG Ratio: 1.4 (calc) (ref 1.0–2.5)
ALT: 13 U/L (ref 6–29)
AST: 17 U/L (ref 10–30)
Albumin: 4.6 g/dL (ref 3.6–5.1)
Alkaline phosphatase (APISO): 55 U/L (ref 31–125)
BUN/Creatinine Ratio: 7 (calc) (ref 6–22)
BUN: 6 mg/dL — ABNORMAL LOW (ref 7–25)
CO2: 25 mmol/L (ref 20–32)
Calcium: 10 mg/dL (ref 8.6–10.2)
Chloride: 106 mmol/L (ref 98–110)
Creat: 0.86 mg/dL (ref 0.50–0.96)
Globulin: 3.3 g/dL (ref 1.9–3.7)
Glucose, Bld: 72 mg/dL (ref 65–99)
Potassium: 3.8 mmol/L (ref 3.5–5.3)
Sodium: 138 mmol/L (ref 135–146)
Total Bilirubin: 0.7 mg/dL (ref 0.2–1.2)
Total Protein: 7.9 g/dL (ref 6.1–8.1)

## 2023-08-16 ENCOUNTER — Encounter: Payer: Self-pay | Admitting: Obstetrics and Gynecology

## 2023-08-28 ENCOUNTER — Encounter: Payer: Self-pay | Admitting: Obstetrics and Gynecology

## 2024-01-01 ENCOUNTER — Other Ambulatory Visit: Payer: Self-pay | Admitting: Family

## 2024-01-01 MED ORDER — VALACYCLOVIR HCL 1 G PO TABS
1000.0000 mg | ORAL_TABLET | Freq: Every day | ORAL | 1 refills | Status: DC
Start: 2024-01-01 — End: 2024-05-27

## 2024-01-01 NOTE — Telephone Encounter (Signed)
 Last Fill: 07/30/23  Last OV: 07/30/23 Next OV: 07/31/24  Routing to provider for review/authorization.

## 2024-01-01 NOTE — Telephone Encounter (Signed)
 Copied from CRM [id number]. Topic: Clinical - Medication Refill >> Jan 01, 2024 12:00 PM Brittney F wrote: Patient is looking to have the listed prescription refilled for the full 90 day prescription; Patient has continuously received just a 30 day supply from the pharmacy.The patient is now being told there is an issue with receiving her prescription. Patient is actively experiencing an outbreak and leaving on Friday so she is hoping the prescription can be filled before them. Please assist patient with this matter and call her with an update at [phone]    Medication: valACYclovir  (VALTREX ) 1000 MG tablet  Has the patient contacted their pharmacy? Yes   This is the patient's preferred pharmacy:  [facility][id number] [place], [place] - [address] AT [facility] OF [facility] & [address] [place] [address] Phone: [phone] Fax: [phone]  Is this the correct pharmacy for this prescription? Yes   Has the prescription been filled recently? No  Is the patient out of the medication? Yes  Has the patient been seen for an appointment in the last year OR does the patient have an upcoming appointment? Yes  Can we respond through MyChart? Yes  Agent: Please be advised that Rx refills may take up to 3 business days. We ask that you follow-up with your pharmacy.

## 2024-05-27 ENCOUNTER — Other Ambulatory Visit: Payer: Self-pay | Admitting: Family

## 2024-05-27 ENCOUNTER — Ambulatory Visit: Payer: Self-pay

## 2024-05-27 DIAGNOSIS — Z8619 Personal history of other infectious and parasitic diseases: Secondary | ICD-10-CM

## 2024-05-27 MED ORDER — VALACYCLOVIR HCL 1 G PO TABS: 1000.0000 mg | ORAL_TABLET | Freq: Every day | ORAL | 1 refills | Status: DC

## 2024-05-27 MED ORDER — VALACYCLOVIR HCL 1 G PO TABS
1000.0000 mg | ORAL_TABLET | Freq: Every day | ORAL | 1 refills | Status: DC
Start: 2024-05-27 — End: 2024-05-27

## 2024-05-27 MED ORDER — VALACYCLOVIR HCL 1 G PO TABS: 1000.0000 mg | ORAL_TABLET | Freq: Every day | ORAL | 1 refills | Status: AC

## 2024-05-27 NOTE — Telephone Encounter (Signed)
 RX was manually faxed by Museum/gallery curator Gregery)

## 2024-05-27 NOTE — Telephone Encounter (Unsigned)
 Copied from CRM #[id number]. Topic: Clinical - Medication Refill >> May 27, 2024 12:44 PM Mercer PEDLAR wrote: Medication: valACYclovir  (VALTREX ) 1000 MG tablet  Has the patient contacted their pharmacy? No (Agent: If no, request that the patient contact the pharmacy for the refill. If patient does not wish to contact the pharmacy document the reason why and proceed with request.) (Agent: If yes, when and what did the pharmacy advise?)  This is the patient's preferred pharmacy:  [facility] #[id number] [place], [place] - [address] AT [facility] OF [facility] & [address] [place] [address] Phone: [phone] Fax: [phone]  Is this the correct pharmacy for this prescription? Yes If no, delete pharmacy and type the correct one.   Has the prescription been filled recently? No  Is the patient out of the medication? Yes  Has the patient been seen for an appointment in the last year OR does the patient have an upcoming appointment? Yes  Can we respond through MyChart? Yes  Agent: Please be advised that Rx refills may take up to 3 business days. We ask that you follow-up with your pharmacy.

## 2024-05-27 NOTE — Telephone Encounter (Signed)
Patient is requesting a refill on valtrex.  

## 2024-05-27 NOTE — Telephone Encounter (Signed)
 FYI Only or Action Required?: Action required by provider: Medication refill.  Patient was last seen in primary care on 07/30/2023 by Ngetich, Roxan BROCKS, NP.  Called Nurse Triage reporting Groin Pain.  Symptoms began several days ago.  Interventions attempted: Nothing.  Symptoms are: gradually worsening.  Triage Disposition: See PCP When Office is Open (Within 3 Days)  Patient/caregiver understands and will follow disposition?: Unsure    Copied from CRM #[id number]. Topic: Clinical - Red Word Triage >> May 27, 2024 12:47 PM Mercer PEDLAR wrote: Red Word that prompted transfer to Nurse Triage: Painful outbreak in groin area. Reason for Disposition  Tender lump (swelling or ball) at vaginal opening  Answer Assessment - Initial Assessment Questions 1. SYMPTOM: What's the main symptom you're concerned about? (e.g., pain, itching, dryness)     Vaginal Pain and a bump on vaginal area  2. LOCATION: Where is the  pain located? (e.g., inside/outside, left/right)     Outside of vaginal lips  3. ONSET: When did the  pain  start?     Saturday  4. PAIN: Is there any pain? If Yes, ask: How bad is it? (Scale: 1-10; mild, moderate, severe)     Mild unless something touches the area  5. ITCHING: Is there any itching? If Yes, ask: How bad is it? (Scale: 1-10; mild, moderate, severe)     Mild  6. CAUSE: What do you think is causing the discharge? Have you had the same problem before? What happened then?     Has had an ingrown hair before  7. OTHER SYMPTOMS: Do you have any other symptoms? (e.g., fever, itching, vaginal bleeding, pain with urination, injury to genital area, vaginal foreign body)     Denies   Patient states it is a possible ingrown hair. Patient offered an appointment in office and declines. Pt states she is unable to make it in office due to transportation issues. Pt requesting a refill of Valtrex  medication for symptoms.  Protocols used: Vaginal Symptoms-A-AH

## 2024-05-27 NOTE — Telephone Encounter (Signed)
 Patient requested refill Pended Rx and sent to [facility] for approval.

## 2024-07-31 ENCOUNTER — Encounter: Payer: Medicaid Other | Admitting: Family
